# Patient Record
Sex: Female | Born: 1945
Health system: Southern US, Community
[De-identification: ages and names within clinical notes are randomized; demographics above are authoritative.]

## PROBLEM LIST (undated history)

## (undated) DIAGNOSIS — E039 Hypothyroidism, unspecified: Secondary | ICD-10-CM

## (undated) DIAGNOSIS — I493 Ventricular premature depolarization: Secondary | ICD-10-CM

## (undated) DIAGNOSIS — F329 Major depressive disorder, single episode, unspecified: Secondary | ICD-10-CM

## (undated) DIAGNOSIS — J45909 Unspecified asthma, uncomplicated: Secondary | ICD-10-CM

## (undated) DIAGNOSIS — E785 Hyperlipidemia, unspecified: Secondary | ICD-10-CM

## (undated) DIAGNOSIS — F419 Anxiety disorder, unspecified: Secondary | ICD-10-CM

## (undated) DIAGNOSIS — I10 Essential (primary) hypertension: Secondary | ICD-10-CM

## (undated) DIAGNOSIS — K529 Noninfective gastroenteritis and colitis, unspecified: Secondary | ICD-10-CM

## (undated) DIAGNOSIS — F32A Depression, unspecified: Secondary | ICD-10-CM

## (undated) DIAGNOSIS — K589 Irritable bowel syndrome without diarrhea: Secondary | ICD-10-CM

## (undated) DIAGNOSIS — K5792 Diverticulitis of intestine, part unspecified, without perforation or abscess without bleeding: Secondary | ICD-10-CM

## (undated) DIAGNOSIS — C439 Malignant melanoma of skin, unspecified: Secondary | ICD-10-CM

## (undated) HISTORY — DX: Diverticulitis of intestine, part unspecified, without perforation or abscess without bleeding: K57.92

## (undated) HISTORY — PX: OTHER SURGICAL HISTORY: SHX169

## (undated) HISTORY — DX: Noninfective gastroenteritis and colitis, unspecified: K52.9

## (undated) HISTORY — PX: FOOT SURGERY: SHX648

## (undated) HISTORY — DX: Unspecified asthma, uncomplicated: J45.909

## (undated) HISTORY — PX: HERNIA REPAIR: SHX51

## (undated) HISTORY — DX: Ventricular premature depolarization: I49.3

## (undated) HISTORY — DX: Depression, unspecified: F32.A

## (undated) HISTORY — DX: Hypothyroidism, unspecified: E03.9

## (undated) HISTORY — DX: Anxiety disorder, unspecified: F41.9

## (undated) HISTORY — DX: Hyperlipidemia, unspecified: E78.5

## (undated) HISTORY — PX: CHOLECYSTECTOMY: SHX55

## (undated) HISTORY — PX: VAGINAL HYSTERECTOMY: SUR661

## (undated) HISTORY — PX: BLADDER REPAIR: SHX76

## (undated) HISTORY — PX: MELANOMA EXCISION: SHX5266

## (undated) HISTORY — DX: Major depressive disorder, single episode, unspecified: F32.9

---

## 1998-04-23 ENCOUNTER — Ambulatory Visit (HOSPITAL_COMMUNITY): Admission: RE | Admit: 1998-04-23 | Discharge: 1998-04-23 | Payer: Self-pay | Admitting: Gastroenterology

## 1998-05-16 ENCOUNTER — Ambulatory Visit (HOSPITAL_COMMUNITY): Admission: RE | Admit: 1998-05-16 | Discharge: 1998-05-17 | Payer: Self-pay | Admitting: General Surgery

## 1999-09-16 ENCOUNTER — Inpatient Hospital Stay (HOSPITAL_COMMUNITY): Admission: RE | Admit: 1999-09-16 | Discharge: 1999-09-20 | Payer: Self-pay | Admitting: Obstetrics and Gynecology

## 1999-12-10 ENCOUNTER — Encounter: Payer: Self-pay | Admitting: Family Medicine

## 1999-12-10 ENCOUNTER — Encounter: Admission: RE | Admit: 1999-12-10 | Discharge: 1999-12-10 | Payer: Self-pay | Admitting: Family Medicine

## 1999-12-17 ENCOUNTER — Encounter: Admission: RE | Admit: 1999-12-17 | Discharge: 1999-12-17 | Payer: Self-pay | Admitting: Family Medicine

## 1999-12-17 ENCOUNTER — Encounter: Payer: Self-pay | Admitting: Family Medicine

## 2000-12-02 ENCOUNTER — Encounter: Payer: Self-pay | Admitting: Family Medicine

## 2000-12-02 ENCOUNTER — Encounter: Admission: RE | Admit: 2000-12-02 | Discharge: 2000-12-02 | Payer: Self-pay | Admitting: Family Medicine

## 2000-12-03 ENCOUNTER — Encounter: Admission: RE | Admit: 2000-12-03 | Discharge: 2000-12-03 | Payer: Self-pay | Admitting: Family Medicine

## 2000-12-03 ENCOUNTER — Encounter: Payer: Self-pay | Admitting: Family Medicine

## 2000-12-28 ENCOUNTER — Ambulatory Visit (HOSPITAL_COMMUNITY): Admission: RE | Admit: 2000-12-28 | Discharge: 2000-12-28 | Payer: Self-pay | Admitting: Neurosurgery

## 2000-12-28 ENCOUNTER — Encounter: Payer: Self-pay | Admitting: Neurosurgery

## 2001-05-03 ENCOUNTER — Encounter: Payer: Self-pay | Admitting: Family Medicine

## 2001-05-03 ENCOUNTER — Encounter: Admission: RE | Admit: 2001-05-03 | Discharge: 2001-05-03 | Payer: Self-pay | Admitting: Family Medicine

## 2002-02-25 ENCOUNTER — Encounter: Payer: Self-pay | Admitting: Gastroenterology

## 2002-02-25 ENCOUNTER — Encounter: Admission: RE | Admit: 2002-02-25 | Discharge: 2002-02-25 | Payer: Self-pay | Admitting: Gastroenterology

## 2002-03-11 ENCOUNTER — Other Ambulatory Visit: Admission: RE | Admit: 2002-03-11 | Discharge: 2002-03-11 | Payer: Self-pay | Admitting: Radiology

## 2002-03-11 ENCOUNTER — Encounter: Payer: Self-pay | Admitting: Family Medicine

## 2002-03-11 ENCOUNTER — Encounter: Admission: RE | Admit: 2002-03-11 | Discharge: 2002-03-11 | Payer: Self-pay | Admitting: Family Medicine

## 2002-03-11 ENCOUNTER — Encounter (INDEPENDENT_AMBULATORY_CARE_PROVIDER_SITE_OTHER): Payer: Self-pay | Admitting: *Deleted

## 2002-03-14 ENCOUNTER — Encounter: Admission: RE | Admit: 2002-03-14 | Discharge: 2002-03-14 | Payer: Self-pay | Admitting: Gastroenterology

## 2002-03-14 ENCOUNTER — Encounter: Payer: Self-pay | Admitting: Gastroenterology

## 2002-03-24 ENCOUNTER — Observation Stay (HOSPITAL_COMMUNITY): Admission: RE | Admit: 2002-03-24 | Discharge: 2002-03-25 | Payer: Self-pay | Admitting: General Surgery

## 2002-03-24 ENCOUNTER — Encounter: Payer: Self-pay | Admitting: General Surgery

## 2002-03-24 ENCOUNTER — Encounter (INDEPENDENT_AMBULATORY_CARE_PROVIDER_SITE_OTHER): Payer: Self-pay | Admitting: Specialist

## 2002-05-31 ENCOUNTER — Encounter: Admission: RE | Admit: 2002-05-31 | Discharge: 2002-05-31 | Payer: Self-pay | Admitting: Family Medicine

## 2002-05-31 ENCOUNTER — Encounter: Payer: Self-pay | Admitting: Family Medicine

## 2002-07-18 ENCOUNTER — Encounter (INDEPENDENT_AMBULATORY_CARE_PROVIDER_SITE_OTHER): Payer: Self-pay | Admitting: Specialist

## 2002-07-18 ENCOUNTER — Inpatient Hospital Stay (HOSPITAL_COMMUNITY): Admission: RE | Admit: 2002-07-18 | Discharge: 2002-07-21 | Payer: Self-pay

## 2002-09-19 ENCOUNTER — Encounter: Payer: Self-pay | Admitting: Family Medicine

## 2002-09-19 ENCOUNTER — Encounter: Admission: RE | Admit: 2002-09-19 | Discharge: 2002-09-19 | Payer: Self-pay | Admitting: Family Medicine

## 2003-10-20 ENCOUNTER — Encounter: Admission: RE | Admit: 2003-10-20 | Discharge: 2003-10-20 | Payer: Self-pay | Admitting: Family Medicine

## 2004-11-14 ENCOUNTER — Encounter: Admission: RE | Admit: 2004-11-14 | Discharge: 2004-11-14 | Payer: Self-pay | Admitting: Family Medicine

## 2005-06-09 ENCOUNTER — Encounter: Admission: RE | Admit: 2005-06-09 | Discharge: 2005-06-09 | Payer: Self-pay | Admitting: Family Medicine

## 2005-11-27 ENCOUNTER — Encounter: Admission: RE | Admit: 2005-11-27 | Discharge: 2005-11-27 | Payer: Self-pay | Admitting: Family Medicine

## 2006-12-01 ENCOUNTER — Encounter: Admission: RE | Admit: 2006-12-01 | Discharge: 2006-12-01 | Payer: Self-pay | Admitting: Family Medicine

## 2007-03-15 ENCOUNTER — Inpatient Hospital Stay (HOSPITAL_COMMUNITY): Admission: RE | Admit: 2007-03-15 | Discharge: 2007-03-17 | Payer: Self-pay | Admitting: Obstetrics and Gynecology

## 2007-05-28 ENCOUNTER — Encounter: Admission: RE | Admit: 2007-05-28 | Discharge: 2007-05-28 | Payer: Self-pay | Admitting: Family Medicine

## 2007-12-31 ENCOUNTER — Encounter: Admission: RE | Admit: 2007-12-31 | Discharge: 2007-12-31 | Payer: Self-pay | Admitting: Family Medicine

## 2008-02-11 ENCOUNTER — Encounter: Admission: RE | Admit: 2008-02-11 | Discharge: 2008-02-11 | Payer: Self-pay | Admitting: Family Medicine

## 2008-10-05 ENCOUNTER — Emergency Department (HOSPITAL_COMMUNITY): Admission: EM | Admit: 2008-10-05 | Discharge: 2008-10-06 | Payer: Self-pay | Admitting: Emergency Medicine

## 2008-10-06 ENCOUNTER — Ambulatory Visit: Payer: Self-pay | Admitting: Psychiatry

## 2008-10-06 ENCOUNTER — Inpatient Hospital Stay (HOSPITAL_COMMUNITY): Admission: RE | Admit: 2008-10-06 | Discharge: 2008-10-10 | Payer: Self-pay | Admitting: Psychiatry

## 2008-10-11 ENCOUNTER — Other Ambulatory Visit (HOSPITAL_COMMUNITY): Admission: RE | Admit: 2008-10-11 | Discharge: 2008-10-16 | Payer: Self-pay | Admitting: Psychiatry

## 2009-01-01 ENCOUNTER — Encounter: Admission: RE | Admit: 2009-01-01 | Discharge: 2009-01-01 | Payer: Self-pay | Admitting: Family Medicine

## 2009-05-30 ENCOUNTER — Ambulatory Visit (HOSPITAL_COMMUNITY): Admission: RE | Admit: 2009-05-30 | Discharge: 2009-05-30 | Payer: Self-pay | Admitting: General Surgery

## 2009-08-09 ENCOUNTER — Ambulatory Visit (HOSPITAL_BASED_OUTPATIENT_CLINIC_OR_DEPARTMENT_OTHER): Admission: RE | Admit: 2009-08-09 | Discharge: 2009-08-09 | Payer: Self-pay | Admitting: Otolaryngology

## 2009-08-27 ENCOUNTER — Inpatient Hospital Stay (HOSPITAL_COMMUNITY): Admission: RE | Admit: 2009-08-27 | Discharge: 2009-08-29 | Payer: Self-pay | Admitting: Otolaryngology

## 2009-08-27 ENCOUNTER — Encounter (INDEPENDENT_AMBULATORY_CARE_PROVIDER_SITE_OTHER): Payer: Self-pay | Admitting: Otolaryngology

## 2009-09-02 ENCOUNTER — Ambulatory Visit: Payer: Self-pay | Admitting: Internal Medicine

## 2009-10-02 ENCOUNTER — Ambulatory Visit (HOSPITAL_COMMUNITY): Admission: RE | Admit: 2009-10-02 | Discharge: 2009-10-03 | Payer: Self-pay | Admitting: Urology

## 2010-01-14 ENCOUNTER — Encounter: Admission: RE | Admit: 2010-01-14 | Discharge: 2010-01-14 | Payer: Self-pay | Admitting: Family Medicine

## 2010-12-23 ENCOUNTER — Other Ambulatory Visit: Payer: Self-pay | Admitting: Family Medicine

## 2010-12-23 DIAGNOSIS — Z1231 Encounter for screening mammogram for malignant neoplasm of breast: Secondary | ICD-10-CM

## 2011-01-13 LAB — ABO/RH: ABO/RH(D): O POS

## 2011-01-13 LAB — HEMOGLOBIN AND HEMATOCRIT, BLOOD
HCT: 36 % (ref 36.0–46.0)
HCT: 36.1 % (ref 36.0–46.0)
Hemoglobin: 11.8 g/dL — ABNORMAL LOW (ref 12.0–15.0)
Hemoglobin: 12.1 g/dL (ref 12.0–15.0)

## 2011-01-13 LAB — CBC
HCT: 40.1 % (ref 36.0–46.0)
Hemoglobin: 13.3 g/dL (ref 12.0–15.0)
MCHC: 33 g/dL (ref 30.0–36.0)
MCV: 94.1 fL (ref 78.0–100.0)
Platelets: 206 10*3/uL (ref 150–400)
RBC: 4.26 MIL/uL (ref 3.87–5.11)
RDW: 13.7 % (ref 11.5–15.5)
WBC: 7.5 10*3/uL (ref 4.0–10.5)

## 2011-01-13 LAB — TYPE AND SCREEN
ABO/RH(D): O POS
Antibody Screen: NEGATIVE

## 2011-01-13 LAB — PROTIME-INR
INR: 0.97 (ref 0.00–1.49)
Prothrombin Time: 12.8 seconds (ref 11.6–15.2)

## 2011-01-13 LAB — APTT: aPTT: 30 seconds (ref 24–37)

## 2011-01-15 LAB — URINALYSIS, ROUTINE W REFLEX MICROSCOPIC
Bilirubin Urine: NEGATIVE
Glucose, UA: NEGATIVE mg/dL
Hgb urine dipstick: NEGATIVE
Ketones, ur: NEGATIVE mg/dL
Nitrite: NEGATIVE
Protein, ur: NEGATIVE mg/dL
Specific Gravity, Urine: 1.02 (ref 1.005–1.030)
Urobilinogen, UA: 0.2 mg/dL (ref 0.0–1.0)
pH: 5.5 (ref 5.0–8.0)

## 2011-01-15 LAB — BASIC METABOLIC PANEL
BUN: 15 mg/dL (ref 6–23)
CO2: 29 mEq/L (ref 19–32)
Calcium: 9 mg/dL (ref 8.4–10.5)
Chloride: 105 mEq/L (ref 96–112)
Creatinine, Ser: 0.92 mg/dL (ref 0.4–1.2)
GFR calc Af Amer: >60 mL/min (ref >60)
GFR calc non Af Amer: >60 mL/min (ref >60)
Glucose, Bld: 106 mg/dL — ABNORMAL HIGH (ref 70–99)
Potassium: 3.6 mEq/L (ref 3.5–5.1)
Sodium: 139 mEq/L (ref 135–145)

## 2011-01-15 LAB — CBC
HCT: 39 % (ref 36.0–46.0)
Hemoglobin: 13.6 g/dL (ref 12.0–15.0)
MCHC: 34.7 g/dL (ref 30.0–36.0)
MCV: 92.4 fL (ref 78.0–100.0)
Platelets: 206 10*3/uL (ref 150–400)
RBC: 4.23 MIL/uL (ref 3.87–5.11)
RDW: 13.1 % (ref 11.5–15.5)
WBC: 7.7 10*3/uL (ref 4.0–10.5)

## 2011-01-18 LAB — CBC
HCT: 39.3 % (ref 36.0–46.0)
Hemoglobin: 13.4 g/dL (ref 12.0–15.0)
MCHC: 34.2 g/dL (ref 30.0–36.0)
MCV: 92.2 fL (ref 78.0–100.0)
Platelets: 202 10*3/uL (ref 150–400)
RBC: 4.26 MIL/uL (ref 3.87–5.11)
RDW: 13.3 % (ref 11.5–15.5)
WBC: 8.2 10*3/uL (ref 4.0–10.5)

## 2011-01-18 LAB — BASIC METABOLIC PANEL
BUN: 11 mg/dL (ref 6–23)
CO2: 32 mEq/L (ref 19–32)
Calcium: 9.4 mg/dL (ref 8.4–10.5)
Chloride: 100 mEq/L (ref 96–112)
Creatinine, Ser: 0.91 mg/dL (ref 0.4–1.2)
GFR calc Af Amer: >60 mL/min (ref >60)
GFR calc non Af Amer: >60 mL/min (ref >60)
Glucose, Bld: 87 mg/dL (ref 70–99)
Potassium: 3.5 mEq/L (ref 3.5–5.1)
Sodium: 139 mEq/L (ref 135–145)

## 2011-01-20 ENCOUNTER — Ambulatory Visit
Admission: RE | Admit: 2011-01-20 | Discharge: 2011-01-20 | Disposition: A | Discharge status: Home / Self Care | Payer: Medicare Other | Source: Ambulatory Visit | Attending: Family Medicine | Admitting: Family Medicine

## 2011-01-20 DIAGNOSIS — Z1231 Encounter for screening mammogram for malignant neoplasm of breast: Secondary | ICD-10-CM

## 2011-02-25 NOTE — H&P (Signed)
[name], [name]               ACCOUNT NO.:  [id number]   MEDICAL RECORD NO.:  [id number]          PATIENT TYPE:  IPS   LOCATION:  0503                          FACILITY:  [facility]   PHYSICIAN:  Geoffery Lyons, M.D.      DATE OF BIRTH:  June 18, 1946   DATE OF ADMISSION:  10/06/2008  DATE OF DISCHARGE:                       PSYCHIATRIC ADMISSION ASSESSMENT   The patient is a 65 year old female voluntarily admitted on October 06, 2008.   HISTORY OF PRESENT ILLNESS:  The patient presents with a self-inflicted  injury.  She cut her left wrist at home.  She has been feeling  depressed, tired, stating that she just does not want to live anymore.  She is having conflict with her daughter.  She was brought to the  emergency room per her husband who wanted her to get some help.   PAST PSYCHIATRIC HISTORY:  First admission to [facility].  She was hospitalized at [facility] about 18 years ago and had  recurrent outpatient mental health treatment.  Patient has been on  Cymbalta in the past.   SOCIAL HISTORY:  A 65 year old married female.  She is retired, was a  Sales executive.   FAMILY HISTORY:  None.   ALCOHOL AND DRUG HISTORY:  Patient denies any alcohol or drug history.  She is a nonsmoker.   PRIMARY CARE Mikias Lanz:  Dr. Clovis Riley   MEDICAL PROBLEMS:  Hypertension.   MEDICATIONS:  Toprol, uncertain of the dose at this time.   DRUG ALLERGIES:  PENICILLIN, CODEINE AND SULFA.   PHYSICAL EXAMINATION:  GENERAL:  This is a middle-aged female assessed  at [facility].  She did receive a tetanus  injection.  She did have her lacerations sutured, and wound is currently  dressed with a Kerlix.  VITAL SIGNS:  Temperature of 97.3, heart rate 60, respirations 18, blood  pressure 153/103.  She is 5 feet 4 inches tall, 186 pounds.   LABORATORY DATA:  Shows a glucose of 105, alcohol level of less than 5,  urine drug screen positive for benzodiazepines.   CBC was within normal  limits.   MENTAL STATUS EXAMINATION:  This is an alert female, cooperative, good  eye contact, currently wearing hospital scrubs.  She is resting in bed.  Her speech is clear, normal pace and tone.  Patient's mood is depressed.  Patient is pleasant but also appears depressed and sad.  Thought  processes are coherent.  No evidence of any psychotic symptoms.  No  delusional statements are made, promise of safety on the unit.  Cognitive function is intact.  Her memory appears to be good.  Her  judgment and insight are fair.   Axis I  Major depressed disorder.  Axis II  Deferred.  Axis III  Hypertension and self-inflicted injury with lacerations to the  left wrist.  Axis IV  Problems with primary support group with her daughter, possible  other psychosocial problems.  Axis V  Current is 30 to 35.   PLAN:  Stabilize her mood and thinking.  We will resume her Toprol and  monitor her wounds.  We will have a family session with her husband for  support concerns.  Patient may benefit from outpatient therapy and  psychiatry med management.  We will consider an antidepressant and  continue to assess patient's other comorbidities.  Her tentative length  of stay at this time is 3-5 days.      Landry Corporal, N.P.      Geoffery Lyons, M.D.  Electronically Signed    JO/MEDQ  D:  10/09/2008  T:  10/09/2008  Job:  [id number]

## 2011-02-25 NOTE — Op Note (Signed)
[name], [name]               ACCOUNT NO.:  [id number]   MEDICAL RECORD NO.:  [id number]          PATIENT TYPE:  AMB   LOCATION:  SDS                          FACILITY:  [facility]   PHYSICIAN:  Gaynelle Adu, MD        DATE OF BIRTH:  06/07/1946   DATE OF PROCEDURE:  05/30/2009  DATE OF DISCHARGE:                               OPERATIVE REPORT   SURGEON:  Mary Sella. Andrey Campanile, MD.   ASSISTANT SURGEON:  Anselm Pancoast. Zachery Dakins, M.D.   PREOPERATIVE DIAGNOSIS:  Left chest wall melanoma (T1A).   POSTOPERATIVE DIAGNOSIS:  Left chest wall melanoma (T1A).   PROCEDURE:  Wide local excision of left chest wall melanoma.   FINDINGS:  1 cm margins were obtained.  Total skin incision was 6 cm in  length.   ANESTHESIA:  General plus 30 mL of 0.25% Marcaine with epinephrine.   SPECIMENS:  Left chest wall melanoma.  Marking stitches include short  superior and long lateral.   ESTIMATED BLOOD LOSS:  Minimal.   COMPLICATIONS:  None immediately apparent.   INDICATIONS FOR PROCEDURE:  The patient is a 65 year old Caucasian  female who went to her dermatologist because she noticed a skin lesion  that would intermittent blister on her left chest wall.  She underwent a  shave biopsy by her dermatologist.  The pathology revealed a malignant  melanoma with Breslow thickness of 0.2 mm with no adverse features.  The  peripheral margin was involved.  She was referred to me for  consideration for surgical management.  We discussed surgical management  including wide local excision.  The risks and benefits of the proposed  procedure were explained in detail including bleeding, infection,  seroma, hematoma, need for additional procedures, and scarring.  She  elected to proceed to surgery after informed consent was obtained.   DESCRIPTION OF PROCEDURE:  The patient was brought to the operating room  and placed supine on the operating room table.  General anesthesia was  established.  A surgical time-out was  performed.  She received 1 g of  vancomycin prior to skin incision.  Her chest wall was prepped and  draped in the usual standard surgical fashion.  Taking ruler and marking  pen, I made a 1 cm circumferential mark around the lesion on her chest  wall then drew an elliptical incision around this incorporating the 1 cm  margin.  Next using a #15 blade, the skin was incised along the previous  outline.  Essentially, it was a tear-drop incision.  Once the dermis was  divided with the knife, skin hooks were placed and electrocautery was  used to divide the subcutaneous tissue and fat straight down.  We then  excised the skin and subcutaneous tissue in its entirety.  The specimen  was removed.  A 3-0 silk suture was used to mark the specimen with a  short superior stitch and a long lateral stitch and it was sent to  Pathology as routine.  Then using electrocautery, hemostasis was  achieved.  The wound was copiously irrigated.  Then using  electrocautery, I released  some of the subcutaneous tissue along the  superior and inferior flap.  Then, the wound was reapproximated with  interrupted inverted 3-0 Vicryl in deep dermal fashion.  Then, 4-0 nylon  sutures were used to reapproximate the skin by using vertical mattress  sutures as well as interrupted sutures.  The wound edges were well  approximated.  A 4x4 was applied followed by an OpSite bandage.  Prior  to final placement of dressing additional local was infiltrated in the  area for total injection of 30 mL of 0.25% Marcaine with epinephrine.  The patient was extubated and taken to the recovery room in stable  condition.  All counts were correct x2.  The patient tolerated the  procedure well.  There were no immediate complications.      Gaynelle Adu, MD  Electronically Signed     EW/MEDQ  D:  05/30/2009  T:  05/31/2009  Job:  [id number]

## 2011-02-28 NOTE — Discharge Summary (Signed)
Mary, Dudley               ACCOUNT NO.:  [id number]   MEDICAL RECORD NO.:  [id number]          PATIENT TYPE:  IPS   LOCATION:  0503                          [facility]ACILITY:  [facility]   PHYSICIAN:  Anselm Jungling, MD  DATE OF BIRTH:  Nov 27, 1945   DATE OF ADMISSION:  10/06/2008  DATE OF DISCHARGE:  10/10/2008                               DISCHARGE SUMMARY   IDENTIFYING DATA AND REASON FOR ADMISSION:  This was an inpatient  psychiatric admission for [name], a 65 year old married white female who  was admitted because of increasing depression and suicidal ideation.  Please refer to the admission note for further details pertaining to the  symptoms, circumstances and history that led to her hospitalization.  She was given an initial Axis I diagnosis of rule out major depressive  episode.   MEDICAL AND LABORATORY:  The patient was medically and physically  assessed by the psychiatric nurse practitioner.  She was continued on a  regimen of Zetia and Zocor.  There were no acute medical issues.   HOSPITAL COURSE:  The patient was admitted to the adult inpatient  psychiatric service.  She presented as a well-nourished, normally-  developed female with a wrist that was dressed to cover her laceration.  She was alert, oriented, and pleasant but quite sad.  There were no  signs or symptoms of psychosis.  She denied any active suicidal ideation  or attempt and verbalized a strong desire for help.  She was involved in  the therapeutic milieu, and started on a trial of Prozac 10 mg daily.  Xanax 0.25 mg b.i.d. was utilized to address anxiety, and Ambien 10 mg  at bed for sleep, in combination with trazodone 50 mg at bed.   She participated well in therapeutic groups and activities.  She did  have supportive family visits over the holiday weekend.  Later in her  stay, we arranged for a family session involving her husband, which she  accepted eagerly.  Her husband was highly supportive.   By the  fifth hospital day, the patient was much brighter, and reported  that she felt much better with respect to her mood.  She reported that  she was sleeping very well.  She was feeling optimistic about her  ability to recover from her depression.  She was interested in our  intensive outpatient program for step-down care.  Her family session  with her husband was very positive.  The patient was appropriate for  discharge on the fifth hospital day.  She agreed to the following  aftercare plan.   AFTERCARE:  The patient was to return to [facility] for the intensive  outpatient program, the morning following her discharge.   DISCHARGE MEDICATIONS:  1. Xanax 0.25 mg b.i.d.  2. Prozac 10 mg daily.  3. Ambien 10 mg every night.  4. Zetia 10 mg daily.  5. Zocor 20 mg daily.  6. Trazodone 50 mg every night.   She was instructed to see her family doctor, Urgent Care, or return to  the emergency department to have her sutures removed on the tenth  day  following their initial placement.   DISCHARGE DIAGNOSES:  AXIS I:  Major depressive episode.  AXIS II:  Deferred.  AXIS III:  History of hypertension.  AXIS IV:  Stressors severe.  AXIS V:  GAF on discharge on 55.      Anselm Jungling, MD  Electronically Signed     SPB/MEDQ  D:  10/18/2008  T:  10/18/2008  Job:  [id number]

## 2011-02-28 NOTE — H&P (Signed)
[name], [name] NO.:  [id number]   MEDICAL RECORD NO.:  [id number]          PATIENT TYPE:  AMB   LOCATION:                                FACILITY:  [facility]   PHYSICIAN:  Duke Salvia. Marcelle Overlie, M.D.DATE OF BIRTH:  1946/07/19   DATE OF ADMISSION:  03/15/2007  DATE OF DISCHARGE:                              HISTORY & PHYSICAL   CHIEF COMPLAINT:  Cystocele or rectocele.   HISTORY OF PRESENT ILLNESS:  A 65 year old who had a prior TAH BSO Burch  procedure by me in 2001.  She has remained continent but has noticed  some increasing vaginal pressure and on exam, had moderate cystocele,  small rectocele with some small degree of vaginal cuff prolapse.  Bimanual was otherwise unremarkable.  I have not seen in some time.  She  has been getting routine care with her family doctor, Dr. Clovis Riley.   We plan A&P repair with SSLS.  This procedure including risk of  bleeding, infection, other complications that may require open or  additional surgery along with her expected recovery time all reviewed  with her.  She has been placed on preoperative vaginal estrogen cream.   CURRENT MEDICATIONS:  Toprol XL, Imipram HCL and Vytorin.   ALLERGIES:  CODEINE, SULFA, PENICILLIN.   PAST SURGERIES:  TAH-BSO Burch procedure in 2001.   OBSTETRICAL HISTORY:  Two vaginal deliveries at term.   FAMILY HISTORY:  Significant for father with diabetes.  Both father and  mother with hypertension and her father had heart disease also.   OTHER SURGERY:  She has had foot surgery, tummy-tuck and esophageal wrap  for a hiatal hernia.   PHYSICAL EXAM:  VITAL SIGNS:  Temperature 98.2, blood pressure 120/82.  HEENT:  Unremarkable.  NECK:  Supple without masses.  LUNGS:  Clear.  CARDIOVASCULAR:  Regular rate and rhythm without murmurs, rubs, or  gallops noted.  BREASTS:  Without masses.  ABDOMEN:  Soft, flat and nontender.  PELVIC:  Vulva was unremarkable.  On straining there was a moderate  cystocele, small rectocele with some mild cuff prolapse.  Bimanual was  otherwise negative.  EXTREMITIES:  Unremarkable.  NEUROLOGIC:  Unremarkable.   IMPRESSION:  Symptomatic cystocele and rectocele.   PLAN:  A&P repair with possible SSLS procedure and risks reviewed as  above.      Richard M. Marcelle Overlie, M.D.  Electronically Signed     RMH/MEDQ  D:  03/05/2007  T:  03/05/2007  Job:  [id number]

## 2011-03-19 ENCOUNTER — Other Ambulatory Visit: Payer: Self-pay

## 2011-07-18 LAB — BASIC METABOLIC PANEL
BUN: 8 mg/dL (ref 6–23)
CO2: 27 mEq/L (ref 19–32)
Calcium: 9 mg/dL (ref 8.4–10.5)
Chloride: 104 mEq/L (ref 96–112)
Creatinine, Ser: 0.9 mg/dL (ref 0.4–1.2)
GFR calc Af Amer: >60 mL/min (ref >60)
GFR calc non Af Amer: >60 mL/min (ref >60)
Glucose, Bld: 105 mg/dL — ABNORMAL HIGH (ref 70–99)
Potassium: 3.5 mEq/L (ref 3.5–5.1)
Sodium: 139 mEq/L (ref 135–145)

## 2011-07-18 LAB — CBC
HCT: 41.8 % (ref 36.0–46.0)
Hemoglobin: 14 g/dL (ref 12.0–15.0)
MCHC: 33.4 g/dL (ref 30.0–36.0)
MCV: 92.8 fL (ref 78.0–100.0)
Platelets: 219 10*3/uL (ref 150–400)
RBC: 4.5 MIL/uL (ref 3.87–5.11)
RDW: 12.4 % (ref 11.5–15.5)
WBC: 8.4 10*3/uL (ref 4.0–10.5)

## 2011-07-18 LAB — DIFFERENTIAL
Basophils Absolute: 0 10*3/uL (ref 0.0–0.1)
Basophils Relative: 0 % (ref 0–1)
Eosinophils Absolute: 0.3 10*3/uL (ref 0.0–0.7)
Eosinophils Relative: 3 % (ref 0–5)
Lymphocytes Relative: 36 % (ref 12–46)
Lymphs Abs: 3 10*3/uL (ref 0.7–4.0)
Monocytes Absolute: 0.7 10*3/uL (ref 0.1–1.0)
Monocytes Relative: 9 % (ref 3–12)
Neutro Abs: 4.4 10*3/uL (ref 1.7–7.7)
Neutrophils Relative %: 52 % (ref 43–77)

## 2011-07-18 LAB — RAPID URINE DRUG SCREEN, HOSP PERFORMED
Amphetamines: NOT DETECTED
Barbiturates: NOT DETECTED
Benzodiazepines: POSITIVE — AB
Cocaine: NOT DETECTED
Opiates: NOT DETECTED
Tetrahydrocannabinol: NOT DETECTED

## 2011-07-18 LAB — ETHANOL: Alcohol, Ethyl (B): <5 mg/dL (ref 0–10)

## 2011-07-18 LAB — TRICYCLICS SCREEN, URINE: TCA Scrn: NOT DETECTED

## 2011-07-31 LAB — CBC
HCT: 33 — ABNORMAL LOW
Hemoglobin: 11.4 — ABNORMAL LOW
MCHC: 34.7
MCV: 89.6
Platelets: 193
RBC: 3.68 — ABNORMAL LOW
RDW: 13.1
WBC: 15.2 — ABNORMAL HIGH

## 2011-09-17 ENCOUNTER — Other Ambulatory Visit: Payer: Self-pay

## 2011-10-01 ENCOUNTER — Other Ambulatory Visit: Payer: Self-pay | Admitting: Dermatology

## 2011-12-09 ENCOUNTER — Other Ambulatory Visit: Payer: Self-pay | Admitting: Gastroenterology

## 2011-12-09 DIAGNOSIS — R1013 Epigastric pain: Secondary | ICD-10-CM

## 2011-12-10 ENCOUNTER — Ambulatory Visit
Admission: RE | Admit: 2011-12-10 | Discharge: 2011-12-10 | Disposition: A | Discharge status: Home / Self Care | Payer: Medicare Other | Source: Ambulatory Visit | Attending: Gastroenterology | Admitting: Gastroenterology

## 2011-12-10 DIAGNOSIS — R1013 Epigastric pain: Secondary | ICD-10-CM

## 2011-12-10 MED ORDER — GADOBENATE DIMEGLUMINE 529 MG/ML IV SOLN
16.0000 mL | Freq: Once | INTRAVENOUS | Status: AC | PRN
  Administered 2011-12-10: 16 mL via INTRAVENOUS

## 2011-12-11 ENCOUNTER — Other Ambulatory Visit: Payer: Self-pay | Admitting: Gastroenterology

## 2011-12-15 ENCOUNTER — Other Ambulatory Visit: Payer: Self-pay | Admitting: Family Medicine

## 2011-12-15 DIAGNOSIS — Z1231 Encounter for screening mammogram for malignant neoplasm of breast: Secondary | ICD-10-CM

## 2012-01-21 ENCOUNTER — Ambulatory Visit
Admission: RE | Admit: 2012-01-21 | Discharge: 2012-01-21 | Disposition: A | Discharge status: Home / Self Care | Payer: Medicare Other | Source: Ambulatory Visit | Attending: Family Medicine | Admitting: Family Medicine

## 2012-01-21 DIAGNOSIS — Z1231 Encounter for screening mammogram for malignant neoplasm of breast: Secondary | ICD-10-CM

## 2012-07-14 ENCOUNTER — Other Ambulatory Visit: Payer: Self-pay | Admitting: Dermatology

## 2012-12-29 ENCOUNTER — Other Ambulatory Visit: Payer: Self-pay

## 2012-12-29 DIAGNOSIS — Z1231 Encounter for screening mammogram for malignant neoplasm of breast: Secondary | ICD-10-CM

## 2013-01-21 ENCOUNTER — Ambulatory Visit: Payer: Medicare Other

## 2013-01-21 ENCOUNTER — Ambulatory Visit
Admission: RE | Admit: 2013-01-21 | Discharge: 2013-01-21 | Disposition: A | Discharge status: Home / Self Care | Payer: Medicare Other | Source: Ambulatory Visit

## 2013-01-21 DIAGNOSIS — Z1231 Encounter for screening mammogram for malignant neoplasm of breast: Secondary | ICD-10-CM

## 2014-01-02 ENCOUNTER — Other Ambulatory Visit: Payer: Self-pay | Admitting: Gastroenterology

## 2014-01-26 ENCOUNTER — Other Ambulatory Visit: Payer: Self-pay

## 2014-01-26 DIAGNOSIS — Z1231 Encounter for screening mammogram for malignant neoplasm of breast: Secondary | ICD-10-CM

## 2014-01-31 ENCOUNTER — Encounter (INDEPENDENT_AMBULATORY_CARE_PROVIDER_SITE_OTHER): Payer: Self-pay

## 2014-01-31 ENCOUNTER — Ambulatory Visit
Admission: RE | Admit: 2014-01-31 | Discharge: 2014-01-31 | Disposition: A | Discharge status: Home / Self Care | Payer: Self-pay | Source: Ambulatory Visit

## 2014-01-31 DIAGNOSIS — Z1231 Encounter for screening mammogram for malignant neoplasm of breast: Secondary | ICD-10-CM

## 2014-05-28 ENCOUNTER — Emergency Department (HOSPITAL_COMMUNITY): Payer: Medicare Other

## 2014-05-28 ENCOUNTER — Observation Stay (HOSPITAL_COMMUNITY)
Admission: EM | Admit: 2014-05-28 | Discharge: 2014-05-30 | Disposition: A | Discharge status: Home / Self Care | Payer: Medicare Other | Attending: Internal Medicine | Admitting: Internal Medicine

## 2014-05-28 ENCOUNTER — Encounter (HOSPITAL_COMMUNITY): Payer: Self-pay | Admitting: Emergency Medicine

## 2014-05-28 DIAGNOSIS — R296 Repeated falls: Secondary | ICD-10-CM | POA: Insufficient documentation

## 2014-05-28 DIAGNOSIS — Z7982 Long term (current) use of aspirin: Secondary | ICD-10-CM | POA: Insufficient documentation

## 2014-05-28 DIAGNOSIS — S0003XA Contusion of scalp, initial encounter: Secondary | ICD-10-CM | POA: Diagnosis present

## 2014-05-28 DIAGNOSIS — Y939 Activity, unspecified: Secondary | ICD-10-CM | POA: Diagnosis not present

## 2014-05-28 DIAGNOSIS — S0993XA Unspecified injury of face, initial encounter: Secondary | ICD-10-CM | POA: Insufficient documentation

## 2014-05-28 DIAGNOSIS — Z79899 Other long term (current) drug therapy: Secondary | ICD-10-CM | POA: Insufficient documentation

## 2014-05-28 DIAGNOSIS — I10 Essential (primary) hypertension: Principal | ICD-10-CM | POA: Diagnosis present

## 2014-05-28 DIAGNOSIS — Z880 Allergy status to penicillin: Secondary | ICD-10-CM | POA: Diagnosis not present

## 2014-05-28 DIAGNOSIS — S0003XS Contusion of scalp, sequela: Secondary | ICD-10-CM

## 2014-05-28 DIAGNOSIS — K589 Irritable bowel syndrome without diarrhea: Secondary | ICD-10-CM | POA: Diagnosis not present

## 2014-05-28 DIAGNOSIS — R29810 Facial weakness: Secondary | ICD-10-CM | POA: Insufficient documentation

## 2014-05-28 DIAGNOSIS — Z85820 Personal history of malignant melanoma of skin: Secondary | ICD-10-CM | POA: Insufficient documentation

## 2014-05-28 DIAGNOSIS — R404 Transient alteration of awareness: Secondary | ICD-10-CM | POA: Insufficient documentation

## 2014-05-28 DIAGNOSIS — S0083XA Contusion of other part of head, initial encounter: Secondary | ICD-10-CM | POA: Insufficient documentation

## 2014-05-28 DIAGNOSIS — S0990XA Unspecified injury of head, initial encounter: Secondary | ICD-10-CM | POA: Insufficient documentation

## 2014-05-28 DIAGNOSIS — R55 Syncope and collapse: Secondary | ICD-10-CM | POA: Diagnosis not present

## 2014-05-28 DIAGNOSIS — Y92009 Unspecified place in unspecified non-institutional (private) residence as the place of occurrence of the external cause: Secondary | ICD-10-CM | POA: Insufficient documentation

## 2014-05-28 DIAGNOSIS — S199XXA Unspecified injury of neck, initial encounter: Secondary | ICD-10-CM

## 2014-05-28 DIAGNOSIS — R531 Weakness: Secondary | ICD-10-CM | POA: Diagnosis present

## 2014-05-28 DIAGNOSIS — S1093XA Contusion of unspecified part of neck, initial encounter: Secondary | ICD-10-CM

## 2014-05-28 DIAGNOSIS — S0003XD Contusion of scalp, subsequent encounter: Secondary | ICD-10-CM

## 2014-05-28 HISTORY — DX: Essential (primary) hypertension: I10

## 2014-05-28 HISTORY — DX: Irritable bowel syndrome, unspecified: K58.9

## 2014-05-28 HISTORY — DX: Malignant melanoma of skin, unspecified: C43.9

## 2014-05-28 LAB — URINALYSIS, ROUTINE W REFLEX MICROSCOPIC
Bilirubin Urine: NEGATIVE
Glucose, UA: NEGATIVE mg/dL
Hgb urine dipstick: NEGATIVE
Ketones, ur: NEGATIVE mg/dL
Leukocytes, UA: NEGATIVE
Nitrite: NEGATIVE
Protein, ur: NEGATIVE mg/dL
Specific Gravity, Urine: 1.019 (ref 1.005–1.030)
Urobilinogen, UA: 0.2 mg/dL (ref 0.0–1.0)
pH: 6.5 (ref 5.0–8.0)

## 2014-05-28 LAB — CBC
HCT: 40.8 % (ref 36.0–46.0)
Hemoglobin: 14.1 g/dL (ref 12.0–15.0)
MCH: 31.7 pg (ref 26.0–34.0)
MCHC: 34.6 g/dL (ref 30.0–36.0)
MCV: 91.7 fL (ref 78.0–100.0)
Platelets: 203 10*3/uL (ref 150–400)
RBC: 4.45 MIL/uL (ref 3.87–5.11)
RDW: 12.9 % (ref 11.5–15.5)
WBC: 8.7 10*3/uL (ref 4.0–10.5)

## 2014-05-28 LAB — COMPREHENSIVE METABOLIC PANEL
ALT: 35 U/L (ref 0–35)
AST: 32 U/L (ref 0–37)
Albumin: 3.5 g/dL (ref 3.5–5.2)
Alkaline Phosphatase: 74 U/L (ref 39–117)
Anion gap: 9 (ref 5–15)
BUN: 14 mg/dL (ref 6–23)
CO2: 29 mEq/L (ref 19–32)
Calcium: 8.9 mg/dL (ref 8.4–10.5)
Chloride: 103 mEq/L (ref 96–112)
Creatinine, Ser: 0.79 mg/dL (ref 0.50–1.10)
GFR calc Af Amer: >90 mL/min (ref >90)
GFR calc non Af Amer: 84 mL/min — ABNORMAL LOW (ref >90)
Glucose, Bld: 132 mg/dL — ABNORMAL HIGH (ref 70–99)
Potassium: 3.5 mEq/L — ABNORMAL LOW (ref 3.7–5.3)
Sodium: 141 mEq/L (ref 137–147)
Total Bilirubin: 0.4 mg/dL (ref 0.3–1.2)
Total Protein: 6.3 g/dL (ref 6.0–8.3)

## 2014-05-28 LAB — DIFFERENTIAL
Basophils Absolute: 0.1 10*3/uL (ref 0.0–0.1)
Basophils Relative: 1 % (ref 0–1)
Eosinophils Absolute: 0.3 10*3/uL (ref 0.0–0.7)
Eosinophils Relative: 4 % (ref 0–5)
Lymphocytes Relative: 23 % (ref 12–46)
Lymphs Abs: 2 10*3/uL (ref 0.7–4.0)
Monocytes Absolute: 0.8 10*3/uL (ref 0.1–1.0)
Monocytes Relative: 9 % (ref 3–12)
Neutro Abs: 5.6 10*3/uL (ref 1.7–7.7)
Neutrophils Relative %: 63 % (ref 43–77)

## 2014-05-28 LAB — APTT: aPTT: 30 seconds (ref 24–37)

## 2014-05-28 LAB — I-STAT CHEM 8, ED
BUN: 14 mg/dL (ref 6–23)
Calcium, Ion: 1.14 mmol/L (ref 1.13–1.30)
Chloride: 103 mEq/L (ref 96–112)
Creatinine, Ser: 0.8 mg/dL (ref 0.50–1.10)
Glucose, Bld: 136 mg/dL — ABNORMAL HIGH (ref 70–99)
HCT: 42 % (ref 36.0–46.0)
Hemoglobin: 14.3 g/dL (ref 12.0–15.0)
Potassium: 3.3 mEq/L — ABNORMAL LOW (ref 3.7–5.3)
Sodium: 141 mEq/L (ref 137–147)
TCO2: 27 mmol/L (ref 0–100)

## 2014-05-28 LAB — PROTIME-INR
INR: 0.99 (ref 0.00–1.49)
Prothrombin Time: 13.1 seconds (ref 11.6–15.2)

## 2014-05-28 LAB — I-STAT TROPONIN, ED: Troponin i, poc: 0 ng/mL (ref 0.00–0.08)

## 2014-05-28 LAB — RAPID URINE DRUG SCREEN, HOSP PERFORMED
Amphetamines: NOT DETECTED
Barbiturates: NOT DETECTED
Benzodiazepines: POSITIVE — AB
Cocaine: NOT DETECTED
Opiates: NOT DETECTED
Tetrahydrocannabinol: NOT DETECTED

## 2014-05-28 LAB — ETHANOL: Alcohol, Ethyl (B): <11 mg/dL (ref 0–11)

## 2014-05-28 MED ORDER — ASPIRIN EC 81 MG PO TBEC: 81.0000 mg | DELAYED_RELEASE_TABLET | Freq: Every day | ORAL | Status: DC

## 2014-05-28 MED ORDER — ENOXAPARIN SODIUM 40 MG/0.4ML ~~LOC~~ SOLN
40.0000 mg | SUBCUTANEOUS | Status: DC
  Administered 2014-05-28 – 2014-05-29 (×2): 40 mg via SUBCUTANEOUS
  Filled 2014-05-28 (×3): qty 0.4

## 2014-05-28 MED ORDER — ASPIRIN 325 MG PO TABS
325.0000 mg | ORAL_TABLET | Freq: Every day | ORAL | Status: DC
  Administered 2014-05-29 – 2014-05-30 (×2): 325 mg via ORAL
  Filled 2014-05-28 (×2): qty 1

## 2014-05-28 MED ORDER — ASPIRIN 300 MG RE SUPP
300.0000 mg | Freq: Every day | RECTAL | Status: DC
  Filled 2014-05-28 (×2): qty 1

## 2014-05-28 MED ORDER — SENNOSIDES-DOCUSATE SODIUM 8.6-50 MG PO TABS
1.0000 | ORAL_TABLET | Freq: Every evening | ORAL | Status: DC | PRN
  Filled 2014-05-28: qty 1

## 2014-05-28 MED ORDER — STROKE: EARLY STAGES OF RECOVERY BOOK
Freq: Once | Status: DC
  Filled 2014-05-28: qty 1

## 2014-05-28 MED ORDER — METOPROLOL TARTRATE 25 MG PO TABS
25.0000 mg | ORAL_TABLET | Freq: Two times a day (BID) | ORAL | Status: DC
  Administered 2014-05-28 – 2014-05-30 (×4): 25 mg via ORAL
  Filled 2014-05-28 (×5): qty 1

## 2014-05-28 MED ORDER — ALPRAZOLAM 0.5 MG PO TABS
2.0000 mg | ORAL_TABLET | Freq: Every day | ORAL | Status: DC
  Administered 2014-05-28 – 2014-05-29 (×2): 2 mg via ORAL
  Filled 2014-05-28 (×3): qty 4

## 2014-05-28 MED ORDER — SERTRALINE HCL 50 MG PO TABS
50.0000 mg | ORAL_TABLET | Freq: Every day | ORAL | Status: DC
  Administered 2014-05-29 – 2014-05-30 (×2): 50 mg via ORAL
  Filled 2014-05-28 (×2): qty 1

## 2014-05-28 MED ORDER — HYDROCHLOROTHIAZIDE 25 MG PO TABS
25.0000 mg | ORAL_TABLET | Freq: Every day | ORAL | Status: DC
  Administered 2014-05-29 – 2014-05-30 (×2): 25 mg via ORAL
  Filled 2014-05-28 (×2): qty 1

## 2014-05-28 MED ORDER — SIMVASTATIN 40 MG PO TABS
40.0000 mg | ORAL_TABLET | Freq: Every day | ORAL | Status: DC
  Administered 2014-05-28 – 2014-05-29 (×2): 40 mg via ORAL
  Filled 2014-05-28 (×3): qty 1

## 2014-05-28 MED ORDER — POTASSIUM CHLORIDE CRYS ER 20 MEQ PO TBCR
40.0000 meq | EXTENDED_RELEASE_TABLET | Freq: Once | ORAL | Status: AC
  Administered 2014-05-28: 40 meq via ORAL
  Filled 2014-05-28: qty 2

## 2014-05-28 MED ORDER — ACETAMINOPHEN 325 MG PO TABS
650.0000 mg | ORAL_TABLET | Freq: Four times a day (QID) | ORAL | Status: DC | PRN
  Administered 2014-05-28 – 2014-05-29 (×2): 650 mg via ORAL
  Filled 2014-05-28 (×3): qty 2

## 2014-05-28 NOTE — ED Notes (Signed)
Admitting at bedside 

## 2014-05-28 NOTE — H&P (Signed)
[facility] Admission History and Physical       [name] WUX:[id number] DOB: 05-Jul-1946 DOA: 05/28/2014  Referring physician:  EDP PCP: No primary provider on file.  Specialists:   Chief Complaint: Passed Out  HPI: [name] is a 68 y.o. female with a history of HTN, IBS, and Malignant Melanoma who was found on the floor of her garage by her husband at 4 pm when he returned home from church.  She does not remember what happened, but she apparently had fallen and hit the back of her head.  She had complaints of pain in the back of her head and in her neck following the fall.   She does not know whether she slipped and fell or passed out.   She reports having left sided weakness following the episode which has not improved.     Review of Systems:  Constitutional: No Weight Loss, No Weight Gain, Night Sweats, Fevers, Chills, Dizziness, Fatigue, or Generalized Weakness HEENT: +Headache, Difficulty Swallowing,Tooth/Dental Problems,Sore Throat,  No Sneezing, Rhinitis, Ear Ache, Nasal Congestion, or Post Nasal Drip,  Cardio-vascular:  No Chest pain, Orthopnea, PND, Edema in Lower Extremities, Anasarca, Dizziness, Palpitations  Resp: No Dyspnea, No DOE, No Productive Cough, No Non-Productive Cough, No Hemoptysis, No Wheezing.    GI: No Heartburn, Indigestion, Abdominal Pain, Nausea, Vomiting, Diarrhea, Hematemesis, Hematochezia, Melena, Change in Bowel Habits,  Loss of Appetite  GU: No Dysuria, Change in Color of Urine, No Urgency or Frequency, No Flank pain.  Musculoskeletal: No Joint Pain or Swelling, No Decreased Range of Motion, No Back Pain.  Neurologic: No Syncope, No Seizures, + Left Sided Weakness, Paresthesia, Vision Disturbance or Loss, No Diplopia, No Vertigo, No Difficulty Walking,  Skin: No Rash or Lesions. Psych: No Change in Mood or Affect, No Depression or Anxiety, No Memory loss, No Confusion, or Hallucinations   Past Medical History  Diagnosis Date  .  Malignant melanoma   . IBS (irritable bowel syndrome)   . Hypertension     History reviewed. No pertinent past surgical history.    Prior to Admission medications   Medication Sig Start Date End Date Taking? Authorizing Provider  Acetaminophen (TYLENOL PO) Take 2 tablets by mouth daily as needed (for pain or headache).   Yes Historical Provider, MD  ALPRAZolam Duanne Moron) 1 MG tablet Take 2 mg by mouth at bedtime. 05/16/14  Yes Historical Provider, MD  aspirin EC 81 MG tablet Take 81 mg by mouth at bedtime.   Yes Historical Provider, MD  hydrochlorothiazide (HYDRODIURIL) 25 MG tablet Take 25 mg by mouth daily. 05/11/14  Yes Historical Provider, MD  Ketotifen Fumarate (ALAWAY OP) Place 5 drops into both eyes daily as needed (for allergies).   Yes Historical Provider, MD  metoprolol tartrate (LOPRESSOR) 25 MG tablet Take 25 mg by mouth 2 (two) times daily. 05/11/14  Yes Historical Provider, MD  sertraline (ZOLOFT) 50 MG tablet Take 50 mg by mouth daily. 05/06/14  Yes Historical Provider, MD  simvastatin (ZOCOR) 40 MG tablet Take 40 mg by mouth at bedtime. 03/31/14  Yes Historical Provider, MD     Allergies  Allergen Reactions  . Codeine Nausea And Vomiting  . Penicillins Swelling  . Flagyl [Metronidazole] Hives  . Sulfa Antibiotics Nausea And Vomiting    Social History:  has no tobacco, alcohol, and drug history on file.     History reviewed. No pertinent family history.     Physical Exam:  GEN:  Pleasant Well Nourished and Well  Developed 68 y.o. Caucasian female examined  and in no acute distress; cooperative with exam Filed Vitals:   05/28/14 1730 05/28/14 1800 05/28/14 1915 05/28/14 1945  BP: 140/66 158/81 153/62 136/67  Pulse: 60 66 117 56  Temp:      TempSrc:      Resp: 16 18 20 16   SpO2: 92% 94% 96% 97%   Blood pressure 136/67, pulse 56, temperature 98.3 F (36.8 C), temperature source Oral, resp. rate 16, SpO2 97.00%. PSYCH: She is alert and oriented x4; does not appear  anxious does not appear depressed; affect is normal HEENT: Normocephalic and Atraumatic, Mucous membranes pink; PERRLA; EOM intact; Fundi:  Benign;  No scleral icterus, Nares: Patent, Oropharynx: Clear, Fair Dentition,    Neck:  FROM, No Cervical Lymphadenopathy nor Thyromegaly or Carotid Bruit; No JVD; Breasts:: Not examined CHEST WALL: No tenderness CHEST: Normal respiration, clear to auscultation bilaterally HEART: Regular rate and rhythm; no murmurs rubs or gallops BACK: No kyphosis or scoliosis; No CVA tenderness ABDOMEN: Positive Bowel Sounds, Soft Non-Tender; No Masses, No Organomegaly. Rectal Exam: Not done EXTREMITIES: No Cyanosis, Clubbing, or Edema; No Ulcerations. Genitalia: not examined PULSES: 2+ and symmetric SKIN: Normal hydration no rash or ulceration CNS:  Alert and Oriented x 4, Speech Clear No Facial Droop, Left Sided Weakness, LUE 4/5 LLE 4/5,    Vascular: pulses palpable throughout    Labs on Admission:  Basic Metabolic Panel:  Recent Labs Lab 05/28/14 1706 05/28/14 1736  NA 141 141  K 3.5* 3.3*  CL 103 103  CO2 29  --   GLUCOSE 132* 136*  BUN 14 14  CREATININE 0.79 0.80  CALCIUM 8.9  --    Liver Function Tests:  Recent Labs Lab 05/28/14 1706  AST 32  ALT 35  ALKPHOS 74  BILITOT 0.4  PROT 6.3  ALBUMIN 3.5   No results found for this basename: LIPASE, AMYLASE,  in the last 168 hours No results found for this basename: AMMONIA,  in the last 168 hours CBC:  Recent Labs Lab 05/28/14 1706 05/28/14 1736  WBC 8.7  --   NEUTROABS 5.6  --   HGB 14.1 14.3  HCT 40.8 42.0  MCV 91.7  --   PLT 203  --    Cardiac Enzymes: No results found for this basename: CKTOTAL, CKMB, CKMBINDEX, TROPONINI,  in the last 168 hours  BNP (last 3 results) No results found for this basename: PROBNP,  in the last 8760 hours CBG: No results found for this basename: GLUCAP,  in the last 168 hours  Radiological Exams on Admission: Ct Head Wo Contrast  05/28/2014    CLINICAL DATA:  Syncope knee, fall.  EXAM: CT HEAD WITHOUT CONTRAST  CT CERVICAL SPINE WITHOUT CONTRAST  TECHNIQUE: Multidetector CT imaging of the head and cervical spine was performed following the standard protocol without intravenous contrast. Multiplanar CT image reconstructions of the cervical spine were also generated.  COMPARISON:  None.  FINDINGS: CT HEAD FINDINGS  The patient has a large scalp contusion posteriorly. No underlying fracture is identified. The brain appears normal without evidence of infarct, hemorrhage, mass lesion, mass effect, midline shift or abnormal extra-axial fluid collection there is no hydrocephalus or pneumocephalus. Imaged paranasal sinuses and mastoid air cells are clear.  CT CERVICAL SPINE FINDINGS  Straightening of the normal cervical lordosis is identified. Vertebral body height and alignment are maintained. There is loss of disc space height and endplate spurring at H2-9 and C6-7. Scattered facet degenerative disease  is also identified. Coarse calcification left lobe of the thyroid is incidentally noted. Lung apices are clear.  IMPRESSION: Large scalp contusion without underlying fracture or acute intracranial abnormality.  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   Electronically Signed   By: Inge Rise M.D.   On: 05/28/2014 18:33   Ct Cervical Spine Wo Contrast  05/28/2014   CLINICAL DATA:  Syncope knee, fall.  EXAM: CT HEAD WITHOUT CONTRAST  CT CERVICAL SPINE WITHOUT CONTRAST  TECHNIQUE: Multidetector CT imaging of the head and cervical spine was performed following the standard protocol without intravenous contrast. Multiplanar CT image reconstructions of the cervical spine were also generated.  COMPARISON:  None.  FINDINGS: CT HEAD FINDINGS  The patient has a large scalp contusion posteriorly. No underlying fracture is identified. The brain appears normal without evidence of infarct, hemorrhage, mass lesion,  mass effect, midline shift or abnormal extra-axial fluid collection there is no hydrocephalus or pneumocephalus. Imaged paranasal sinuses and mastoid air cells are clear.  CT CERVICAL SPINE FINDINGS  Straightening of the normal cervical lordosis is identified. Vertebral body height and alignment are maintained. There is loss of disc space height and endplate spurring at P1-0 and C6-7. Scattered facet degenerative disease is also identified. Coarse calcification left lobe of the thyroid is incidentally noted. Lung apices are clear.  IMPRESSION: Large scalp contusion without underlying fracture or acute intracranial abnormality.  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   Electronically Signed   By: Inge Rise M.D.   On: 05/28/2014 18:33     EKG: Independently reviewed. Sinus Bradycardia at 58   Assessment/Plan:   68 y.o. female with  Principal Problem:   1.   Left-sided weakness   CVA Workup, Neuro Checks, MRI/MRA , Carotid US, 2D ECHO in AM,      Fasting Lipids, and HbA1C in AM   ASA Rx   Neurolgy Consult   Active Problems:   2.   Syncope and collapse   Syncope Workup   Cardiac Monitoring   Check Orthostatics     3.   Hypertension   Continue  HCTZ, Metoprolol Rx   Monitor BPs     4.   Scalp Contusion    Pain Control PRN       5.   Hypokalemia- due to diuretic Rx   Replete K+     6.  DVT prophylaxis   Lovenox     Code Status:  FULL CODE  Family Communication:  Husband at Bedside   Disposition Plan:    Inpatient  Time spent:  [place] [facility] Pager [phone]   If [place] Please Contact the Day Rounding Team MD for [facility]  If 7PM-7AM, Please Contact night-coverage  [email or web address] Password [email or web address] 05/28/2014, 8:13 PM

## 2014-05-28 NOTE — ED Notes (Signed)
C-collar removed per verbal from Dr. Tyrone Nine.

## 2014-05-28 NOTE — ED Provider Notes (Signed)
I saw and evaluated the patient, reviewed the resident's note and I agree with the findings and plan.   EKG Interpretation   Date/Time:  Sunday May 28 2014 16:54:51 EDT Ventricular Rate:  58 PR Interval:  167 QRS Duration: 96 QT Interval:  457 QTC Calculation: 449 R Axis:   6 Text Interpretation:  Sinus rhythm Abnormal R-wave progression, early  transition Confirmed by Alvino Chapel  MD, Ovid Curd [phone]) on 05/28/2014  11:18:08 PM     Patient with syncope. She did hit her head but does not know the events surrounding it. Has had some. We'll neurologic deficits. Head CT and cervical spine CT reassuring. Will admit to internal medicine  Jasper Riling. Alvino Chapel, MD 05/28/14 [phone]

## 2014-05-28 NOTE — ED Provider Notes (Signed)
CSN: [id number]     Arrival date & time 05/28/14  1648 History   First MD Initiated Contact with Patient 05/28/14 1649     Chief Complaint  Patient presents with  . Loss of Consciousness     (Consider location/radiation/quality/duration/timing/severity/associated sxs/prior Treatment) Patient is a 68 y.o. female presenting with fall. The history is provided by the patient, the EMS personnel and the spouse.  Fall This is a new problem. The current episode started today. The problem has been unchanged. Associated symptoms include headaches, neck pain and weakness (L sided). Pertinent negatives include no arthralgias, chest pain, chills, congestion, fever, myalgias, nausea or vomiting. Nothing aggravates the symptoms. She has tried nothing for the symptoms. The treatment provided no relief.   68 yo F with a chief complaint of a fall. Patient was in a house by herself does not recall the incident. Patient called her husband about 30 minutes after he left the house in told him that he should call and hit her head. EMS arrived on the scene said that she was having left-sided facial droop and right side grip strength weakness. On arrival here patient having mild left-sided grip strength weakness as well as left-sided she always. Patient with subjective decreased sensation to the left arm. Patient with unknown LOC.  History of melena melanoma. Noted to have a left breast lump by EMS.  Past Medical History  Diagnosis Date  . Malignant melanoma   . IBS (irritable bowel syndrome)   . Hypertension    History reviewed. No pertinent past surgical history. History reviewed. No pertinent family history. History  Substance Use Topics  . Smoking status: Not on file  . Smokeless tobacco: Not on file  . Alcohol Use: Not on file   OB History   Grav Para Term Preterm Abortions TAB SAB Ect Mult Living                 Review of Systems  Constitutional: Negative for fever and chills.  HENT: Negative for  congestion and rhinorrhea.        Neck pain, left-sided jaw pain. Feels that her teeth dont fit together   Eyes: Negative for redness and visual disturbance.  Respiratory: Negative for shortness of breath and wheezing.   Cardiovascular: Negative for chest pain and palpitations.  Gastrointestinal: Negative for nausea and vomiting.  Genitourinary: Negative for dysuria and urgency.  Musculoskeletal: Positive for neck pain. Negative for arthralgias and myalgias.  Skin: Negative for pallor and wound.  Neurological: Positive for weakness (L sided) and headaches. Negative for dizziness, syncope and speech difficulty.      Allergies  Codeine; Penicillins; Flagyl; and Sulfa antibiotics  Home Medications   Prior to Admission medications   Medication Sig Start Date End Date Taking? Authorizing Provider  Acetaminophen (TYLENOL PO) Take 2 tablets by mouth daily as needed (for pain or headache).   Yes Historical Provider, MD  ALPRAZolam Duanne Moron) 1 MG tablet Take 2 mg by mouth at bedtime. 05/16/14  Yes Historical Provider, MD  aspirin EC 81 MG tablet Take 81 mg by mouth at bedtime.   Yes Historical Provider, MD  hydrochlorothiazide (HYDRODIURIL) 25 MG tablet Take 25 mg by mouth daily. 05/11/14  Yes Historical Provider, MD  Ketotifen Fumarate (ALAWAY OP) Place 5 drops into both eyes daily as needed (for allergies).   Yes Historical Provider, MD  metoprolol tartrate (LOPRESSOR) 25 MG tablet Take 25 mg by mouth 2 (two) times daily. 05/11/14  Yes Historical Provider, MD  sertraline (  ZOLOFT) 50 MG tablet Take 50 mg by mouth daily. 05/06/14  Yes Historical Provider, MD  simvastatin (ZOCOR) 40 MG tablet Take 40 mg by mouth at bedtime. 03/31/14  Yes Historical Provider, MD   BP 136/67  Pulse 56  Temp(Src) 98.3 F (36.8 C) (Oral)  Resp 16  SpO2 97% Physical Exam  Constitutional: She is oriented to person, place, and time. She appears well-developed and well-nourished. No distress.  HENT:  Head:  Normocephalic.  A large hematoma noted to the occiput. No noted bleeding.  Eyes: EOM are normal. Pupils are equal, round, and reactive to light.  Neck: Normal range of motion. Neck supple.  Cardiovascular: Normal rate and regular rhythm.  Exam reveals no gallop and no friction rub.   No murmur heard. Pulmonary/Chest: Effort normal. She has no wheezes. She has no rales.  Abdominal: Soft. She exhibits no distension. There is no tenderness. There is no rebound and no guarding.  Musculoskeletal: She exhibits no edema and no tenderness.  No step-offs or deformities of the T. and L-spine. No tenderness to palpation of the T. and L-spine. Patient complaining of midline spinal tenderness of the C-spine worst at C6-C7.  No palpable chest wall tenderness. No abdominal  tenderness.  Neurological: She is alert and oriented to person, place, and time.  Skin: Skin is warm and dry. She is not diaphoretic.  Psychiatric: She has a normal mood and affect. Her behavior is normal.    ED Course  Procedures (including critical care time) Labs Review Labs Reviewed  COMPREHENSIVE METABOLIC PANEL - Abnormal; Notable for the following:    Potassium 3.5 (*)    Glucose, Bld 132 (*)    GFR calc non Af Amer 84 (*)    All other components within normal limits  I-STAT CHEM 8, ED - Abnormal; Notable for the following:    Potassium 3.3 (*)    Glucose, Bld 136 (*)    All other components within normal limits  ETHANOL  PROTIME-INR  APTT  CBC  DIFFERENTIAL  URINE RAPID DRUG SCREEN (HOSP PERFORMED)  URINALYSIS, ROUTINE W REFLEX MICROSCOPIC  I-STAT TROPOININ, ED    Imaging Review Ct Head Wo Contrast  05/28/2014   CLINICAL DATA:  Syncope knee, fall.  EXAM: CT HEAD WITHOUT CONTRAST  CT CERVICAL SPINE WITHOUT CONTRAST  TECHNIQUE: Multidetector CT imaging of the head and cervical spine was performed following the standard protocol without intravenous contrast. Multiplanar CT image reconstructions of the cervical  spine were also generated.  COMPARISON:  None.  FINDINGS: CT HEAD FINDINGS  The patient has a large scalp contusion posteriorly. No underlying fracture is identified. The brain appears normal without evidence of infarct, hemorrhage, mass lesion, mass effect, midline shift or abnormal extra-axial fluid collection there is no hydrocephalus or pneumocephalus. Imaged paranasal sinuses and mastoid air cells are clear.  CT CERVICAL SPINE FINDINGS  Straightening of the normal cervical lordosis is identified. Vertebral body height and alignment are maintained. There is loss of disc space height and endplate spurring at K0-8 and C6-7. Scattered facet degenerative disease is also identified. Coarse calcification left lobe of the thyroid is incidentally noted. Lung apices are clear.  IMPRESSION: Large scalp contusion without underlying fracture or acute intracranial abnormality.  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   Electronically Signed   By: Inge Rise M.D.   On: 05/28/2014 18:33   Ct Cervical Spine Wo Contrast  05/28/2014   CLINICAL DATA:  Syncope  knee, fall.  EXAM: CT HEAD WITHOUT CONTRAST  CT CERVICAL SPINE WITHOUT CONTRAST  TECHNIQUE: Multidetector CT imaging of the head and cervical spine was performed following the standard protocol without intravenous contrast. Multiplanar CT image reconstructions of the cervical spine were also generated.  COMPARISON:  None.  FINDINGS: CT HEAD FINDINGS  The patient has a large scalp contusion posteriorly. No underlying fracture is identified. The brain appears normal without evidence of infarct, hemorrhage, mass lesion, mass effect, midline shift or abnormal extra-axial fluid collection there is no hydrocephalus or pneumocephalus. Imaged paranasal sinuses and mastoid air cells are clear.  CT CERVICAL SPINE FINDINGS  Straightening of the normal cervical lordosis is identified. Vertebral body height and alignment are  maintained. There is loss of disc space height and endplate spurring at G6-2 and C6-7. Scattered facet degenerative disease is also identified. Coarse calcification left lobe of the thyroid is incidentally noted. Lung apices are clear.  IMPRESSION: Large scalp contusion without underlying fracture or acute intracranial abnormality.  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   Electronically Signed   By: Inge Rise M.D.   On: 05/28/2014 18:33     EKG Interpretation None      MDM   Final diagnoses:  None    68 yo F with a chief complaint of a fall. Some concern for decreased strength in the left upper extremity. We'll obtain a CT head C-spine. Neurology will come and evaluate.  Patient with negative CT and a CT C spine without workup unremarkable. We'll admit the patient for TIA/stroke workup    Deno Etienne, MD 05/28/14 [phone]

## 2014-05-28 NOTE — ED Notes (Signed)
Per EMS: pt with syncopal episode at home.  Unknown when fall occurred or how long pt was unconscious for.  Pt reports husband going to church at 3pm.  Husband fount pt at home with lump to the back of her head around 4pm.  Pt noted to be 90% on RA; pt placed on 2L [place].  Pt reporting blurred vision. Pt with right-sided weakness on exam.  C-collar placed on pt on arrival.

## 2014-05-28 NOTE — ED Notes (Signed)
Pt still reporting weakness to left arm; strength not as strong as in right arm.

## 2014-05-29 ENCOUNTER — Observation Stay (HOSPITAL_COMMUNITY): Payer: Medicare Other

## 2014-05-29 DIAGNOSIS — IMO0002 Reserved for concepts with insufficient information to code with codable children: Secondary | ICD-10-CM

## 2014-05-29 DIAGNOSIS — M6281 Muscle weakness (generalized): Secondary | ICD-10-CM

## 2014-05-29 DIAGNOSIS — I369 Nonrheumatic tricuspid valve disorder, unspecified: Secondary | ICD-10-CM

## 2014-05-29 DIAGNOSIS — I10 Essential (primary) hypertension: Secondary | ICD-10-CM

## 2014-05-29 LAB — LIPID PANEL
Cholesterol: 139 mg/dL (ref 0–200)
HDL: 34 mg/dL — ABNORMAL LOW (ref >39)
LDL Cholesterol: 66 mg/dL (ref 0–99)
Total CHOL/HDL Ratio: 4.1 RATIO
Triglycerides: 193 mg/dL — ABNORMAL HIGH (ref <150)
VLDL: 39 mg/dL (ref 0–40)

## 2014-05-29 LAB — HEMOGLOBIN A1C
Hgb A1c MFr Bld: 5.8 % — ABNORMAL HIGH (ref <5.7)
Mean Plasma Glucose: 120 mg/dL — ABNORMAL HIGH (ref <117)

## 2014-05-29 MED ORDER — PROCHLORPERAZINE EDISYLATE 5 MG/ML IJ SOLN
10.0000 mg | Freq: Four times a day (QID) | INTRAMUSCULAR | Status: DC | PRN
  Filled 2014-05-29: qty 2

## 2014-05-29 MED ORDER — ONDANSETRON HCL 4 MG/2ML IJ SOLN
4.0000 mg | Freq: Four times a day (QID) | INTRAMUSCULAR | Status: DC | PRN
  Administered 2014-05-29: 4 mg via INTRAVENOUS
  Filled 2014-05-29: qty 2

## 2014-05-29 MED ORDER — KETOROLAC TROMETHAMINE 30 MG/ML IJ SOLN
30.0000 mg | Freq: Once | INTRAMUSCULAR | Status: AC
  Administered 2014-05-29: 30 mg via INTRAVENOUS
  Filled 2014-05-29: qty 1

## 2014-05-29 MED ORDER — IBUPROFEN 400 MG PO TABS
400.0000 mg | ORAL_TABLET | ORAL | Status: DC | PRN
  Administered 2014-05-30 (×2): 400 mg via ORAL
  Filled 2014-05-29 (×3): qty 1

## 2014-05-29 MED ORDER — OXYCODONE HCL 5 MG PO TABS
5.0000 mg | ORAL_TABLET | ORAL | Status: DC | PRN
  Administered 2014-05-30: 5 mg via ORAL
  Filled 2014-05-29: qty 1

## 2014-05-29 NOTE — Consult Note (Signed)
Referring Physician: Dr. Wynelle Cleveland    Chief Complaint: left sided weakness s/p fall.  HPI:                                                                                                                                         [name] is an 68 y.o. female with a past medical history significant for HTN, malignant melanoma, IBS, admitted to the hospital after sustaining a fall yesterday and found to have left sided weakness on initial assessment. Patient said that she just remember being home by herself and walking when suddenly and without any warnings she felt backwards and hit the concrete. She doesn't recall for how long she was out but she was able to get up by herself and maneuver to go to her bed and called her husband who was at church. According to her husband, when he got home she was lying in bed moaning and complaining of being sore but was not confused and he didn't notice bladder or bowel incontinence, or tongue biting. EMS was summoned and she was answering question appropriately but some left sided weakness and left facial drop was noted. Initial impression in the ED was syncope. CT brain and C spine revealed no acute intracranial abnormality.  Date last known well: 05/28/14 Time last known well: unable to determine  tPA Given: no, out of the window, unknown time of onset   Past Medical History  Diagnosis Date  . Malignant melanoma   . IBS (irritable bowel syndrome)   . Hypertension     History reviewed. No pertinent past surgical history.  History reviewed. No pertinent family history. Social History:  reports that she has never smoked. She does not have any smokeless tobacco history on file. She reports that she does not drink alcohol or use illicit drugs.  Allergies:  Allergies  Allergen Reactions  . Codeine Nausea And Vomiting  . Penicillins Swelling  . Flagyl [Metronidazole] Hives  . Sulfa Antibiotics Nausea And Vomiting    Medications:                                                                                                                            I have reviewed the patient's current medications. Scheduled: .  stroke: mapping our early stages of recovery book   Does not apply Once  . ALPRAZolam  2 mg Oral QHS  . aspirin  300 mg Rectal Daily   Or  . aspirin  325 mg Oral Daily  . enoxaparin (LOVENOX) injection  40 mg Subcutaneous Q24H  . hydrochlorothiazide  25 mg Oral Daily  . metoprolol tartrate  25 mg Oral BID  . sertraline  50 mg Oral Daily  . simvastatin  40 mg Oral QHS    ROS:                                                                                                                                       History obtained from the patient, husband, and chart review.  General ROS: negative for - chills, fatigue, fever, night sweats, weight gain or weight loss Psychological ROS: negative for - behavioral disorder, hallucinations, memory difficulties, mood swings or suicidal ideation Ophthalmic ROS: negative for - blurry vision, double vision, eye pain or loss of vision ENT ROS: negative for - epistaxis, nasal discharge, oral lesions, sore throat, tinnitus or vertigo Allergy and Immunology ROS: negative for - hives or itchy/watery eyes Hematological and Lymphatic ROS: negative for - bleeding problems, bruising or swollen lymph nodes Endocrine ROS: negative for - galactorrhea, hair pattern changes, polydipsia/polyuria or temperature intolerance Respiratory ROS: negative for - cough, hemoptysis, shortness of breath or wheezing Cardiovascular ROS: negative for - chest pain, dyspnea on exertion, edema or irregular heartbeat Gastrointestinal ROS: negative for - abdominal pain, diarrhea, hematemesis, nausea/vomiting or stool incontinence Genito-Urinary ROS: negative for - dysuria, hematuria, incontinence or urinary frequency/urgency Musculoskeletal ROS: negative for - joint swelling Neurological ROS: as noted in  HPI Dermatological ROS: negative for rash and skin lesion changes  Physical exam: pleasant female complaining of generalized body.Blood pressure 159/59, pulse 59, temperature 98.9 F (37.2 C), temperature source Oral, resp. rate 18, height '5\' 4"'  (1.626 m), weight 85.412 kg (188 lb 4.8 oz), SpO2 97.00%.   Head: normocephalic. Neck: supple, no bruits, no JVD. Cardiac: no murmurs. Lungs: clear. Abdomen: soft, no tender, no mass. Extremities: no edema. Neurologic Examination:                                                                                                      General: Mental Status: Alert, oriented, thought content appropriate.  Speech fluent without evidence of aphasia.  Able to follow 3 step commands without difficulty. Cranial Nerves: II: Discs flat bilaterally; Visual fields grossly normal, pupils equal, round, reactive to light and accommodation III,IV, VI: ptosis not present, extra-ocular motions intact  bilaterally V,VII: smile symmetric, facial light touch sensation normal bilaterally VIII: hearing normal bilaterally IX,X: gag reflex present XI: bilateral shoulder shrug XII: midline tongue extension without atrophy or fasciculations  Motor: Significant for subtle left sided weakness. Tone and bulk:normal tone throughout; no atrophy noted Sensory: Pinprick and light touch intact throughout, bilaterally Deep Tendon Reflexes:  1 all over Plantars: Right: downgoing   Left: downgoing Cerebellar: normal finger-to-nose,  normal heel-to-shin test Gait: No tested  Results for orders placed during the hospital encounter of 05/28/14 (from the past 48 hour(s))  ETHANOL     Status: None   Collection Time    05/28/14  5:06 PM      Result Value Ref Range   Alcohol, Ethyl (B) <11  0 - 11 mg/dL   Comment:            LOWEST DETECTABLE LIMIT FOR     SERUM ALCOHOL IS 11 mg/dL     FOR MEDICAL PURPOSES ONLY  PROTIME-INR     Status: None   Collection Time    05/28/14   5:06 PM      Result Value Ref Range   Prothrombin Time 13.1  11.6 - 15.2 seconds   INR 0.99  0.00 - 1.49  APTT     Status: None   Collection Time    05/28/14  5:06 PM      Result Value Ref Range   aPTT 30  24 - 37 seconds  CBC     Status: None   Collection Time    05/28/14  5:06 PM      Result Value Ref Range   WBC 8.7  4.0 - 10.5 K/uL   RBC 4.45  3.87 - 5.11 MIL/uL   Hemoglobin 14.1  12.0 - 15.0 g/dL   HCT 40.8  36.0 - 46.0 %   MCV 91.7  78.0 - 100.0 fL   MCH 31.7  26.0 - 34.0 pg   MCHC 34.6  30.0 - 36.0 g/dL   RDW 12.9  11.5 - 15.5 %   Platelets 203  150 - 400 K/uL  DIFFERENTIAL     Status: None   Collection Time    05/28/14  5:06 PM      Result Value Ref Range   Neutrophils Relative % 63  43 - 77 %   Neutro Abs 5.6  1.7 - 7.7 K/uL   Lymphocytes Relative 23  12 - 46 %   Lymphs Abs 2.0  0.7 - 4.0 K/uL   Monocytes Relative 9  3 - 12 %   Monocytes Absolute 0.8  0.1 - 1.0 K/uL   Eosinophils Relative 4  0 - 5 %   Eosinophils Absolute 0.3  0.0 - 0.7 K/uL   Basophils Relative 1  0 - 1 %   Basophils Absolute 0.1  0.0 - 0.1 K/uL  COMPREHENSIVE METABOLIC PANEL     Status: Abnormal   Collection Time    05/28/14  5:06 PM      Result Value Ref Range   Sodium 141  137 - 147 mEq/L   Potassium 3.5 (*) 3.7 - 5.3 mEq/L   Chloride 103  96 - 112 mEq/L   CO2 29  19 - 32 mEq/L   Glucose, Bld 132 (*) 70 - 99 mg/dL   BUN 14  6 - 23 mg/dL   Creatinine, Ser 0.79  0.50 - 1.10 mg/dL   Calcium 8.9  8.4 - 10.5 mg/dL   Total Protein 6.3  6.0 - 8.3 g/dL   Albumin 3.5  3.5 - 5.2 g/dL   AST 32  0 - 37 U/L   ALT 35  0 - 35 U/L   Alkaline Phosphatase 74  39 - 117 U/L   Total Bilirubin 0.4  0.3 - 1.2 mg/dL   GFR calc non Af Amer 84 (*) >90 mL/min   GFR calc Af Amer >90  >90 mL/min   Comment: (NOTE)     The eGFR has been calculated using the CKD EPI equation.     This calculation has not been validated in all clinical situations.     eGFR's persistently <90 mL/min signify possible Chronic  Kidney     Disease.   Anion gap 9  5 - 15  I-STAT TROPOININ, ED     Status: None   Collection Time    05/28/14  5:35 PM      Result Value Ref Range   Troponin i, poc 0.00  0.00 - 0.08 ng/mL   Comment 3            Comment: Due to the release kinetics of cTnI,     a negative result within the first hours     of the onset of symptoms does not rule out     myocardial infarction with certainty.     If myocardial infarction is still suspected,     repeat the test at appropriate intervals.  I-STAT CHEM 8, ED     Status: Abnormal   Collection Time    05/28/14  5:36 PM      Result Value Ref Range   Sodium 141  137 - 147 mEq/L   Potassium 3.3 (*) 3.7 - 5.3 mEq/L   Chloride 103  96 - 112 mEq/L   BUN 14  6 - 23 mg/dL   Creatinine, Ser 0.80  0.50 - 1.10 mg/dL   Glucose, Bld 136 (*) 70 - 99 mg/dL   Calcium, Ion 1.14  1.13 - 1.30 mmol/L   TCO2 27  0 - 100 mmol/L   Hemoglobin 14.3  12.0 - 15.0 g/dL   HCT 42.0  36.0 - 46.0 %  URINE RAPID DRUG SCREEN (HOSP PERFORMED)     Status: Abnormal   Collection Time    05/28/14  9:50 PM      Result Value Ref Range   Opiates NONE DETECTED  NONE DETECTED   Cocaine NONE DETECTED  NONE DETECTED   Benzodiazepines POSITIVE (*) NONE DETECTED   Amphetamines NONE DETECTED  NONE DETECTED   Tetrahydrocannabinol NONE DETECTED  NONE DETECTED   Barbiturates NONE DETECTED  NONE DETECTED   Comment:            DRUG SCREEN FOR MEDICAL PURPOSES     ONLY.  IF CONFIRMATION IS NEEDED     FOR ANY PURPOSE, NOTIFY LAB     WITHIN 5 DAYS.                LOWEST DETECTABLE LIMITS     FOR URINE DRUG SCREEN     Drug Class       Cutoff (ng/mL)     Amphetamine      1000     Barbiturate      200     Benzodiazepine   194     Tricyclics       174     Opiates          300     Cocaine  300     THC              50  URINALYSIS, ROUTINE W REFLEX MICROSCOPIC     Status: None   Collection Time    05/28/14  9:50 PM      Result Value Ref Range   Color, Urine YELLOW  YELLOW    APPearance CLEAR  CLEAR   Specific Gravity, Urine 1.019  1.005 - 1.030   pH 6.5  5.0 - 8.0   Glucose, UA NEGATIVE  NEGATIVE mg/dL   Hgb urine dipstick NEGATIVE  NEGATIVE   Bilirubin Urine NEGATIVE  NEGATIVE   Ketones, ur NEGATIVE  NEGATIVE mg/dL   Protein, ur NEGATIVE  NEGATIVE mg/dL   Urobilinogen, UA 0.2  0.0 - 1.0 mg/dL   Nitrite NEGATIVE  NEGATIVE   Leukocytes, UA NEGATIVE  NEGATIVE   Comment: MICROSCOPIC NOT DONE ON URINES WITH NEGATIVE PROTEIN, BLOOD, LEUKOCYTES, NITRITE, OR GLUCOSE <1000 mg/dL.  LIPID PANEL     Status: Abnormal   Collection Time    05/29/14  4:24 AM      Result Value Ref Range   Cholesterol 139  0 - 200 mg/dL   Triglycerides 193 (*) <150 mg/dL   HDL 34 (*) >39 mg/dL   Total CHOL/HDL Ratio 4.1     VLDL 39  0 - 40 mg/dL   LDL Cholesterol 66  0 - 99 mg/dL   Comment:            Total Cholesterol/HDL:CHD Risk     Coronary Heart Disease Risk Table                         Men   Women      1/2 Average Risk   3.4   3.3      Average Risk       5.0   4.4      2 X Average Risk   9.6   7.1      3 X Average Risk  23.4   11.0                Use the calculated Patient Ratio     above and the CHD Risk Table     to determine the patient's CHD Risk.                ATP III CLASSIFICATION (LDL):      <100     mg/dL   Optimal      100-129  mg/dL   Near or Above                        Optimal      130-159  mg/dL   Borderline      160-189  mg/dL   High      >190     mg/dL   Very High   Ct Head Wo Contrast  05/28/2014   CLINICAL DATA:  Syncope knee, fall.  EXAM: CT HEAD WITHOUT CONTRAST  CT CERVICAL SPINE WITHOUT CONTRAST  TECHNIQUE: Multidetector CT imaging of the head and cervical spine was performed following the standard protocol without intravenous contrast. Multiplanar CT image reconstructions of the cervical spine were also generated.  COMPARISON:  None.  FINDINGS: CT HEAD FINDINGS  The patient has a large scalp contusion posteriorly. No underlying fracture is  identified. The brain appears normal without evidence of infarct, hemorrhage, mass lesion, mass effect,  midline shift or abnormal extra-axial fluid collection there is no hydrocephalus or pneumocephalus. Imaged paranasal sinuses and mastoid air cells are clear.  CT CERVICAL SPINE FINDINGS  Straightening of the normal cervical lordosis is identified. Vertebral body height and alignment are maintained. There is loss of disc space height and endplate spurring at Y8-1 and C6-7. Scattered facet degenerative disease is also identified. Coarse calcification left lobe of the thyroid is incidentally noted. Lung apices are clear.  IMPRESSION: Large scalp contusion without underlying fracture or acute intracranial abnormality.  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   Electronically Signed   By: Inge Rise M.D.   On: 05/28/2014 18:33   Ct Cervical Spine Wo Contrast  05/28/2014   CLINICAL DATA:  Syncope knee, fall.  EXAM: CT HEAD WITHOUT CONTRAST  CT CERVICAL SPINE WITHOUT CONTRAST  TECHNIQUE: Multidetector CT imaging of the head and cervical spine was performed following the standard protocol without intravenous contrast. Multiplanar CT image reconstructions of the cervical spine were also generated.  COMPARISON:  None.  FINDINGS: CT HEAD FINDINGS  The patient has a large scalp contusion posteriorly. No underlying fracture is identified. The brain appears normal without evidence of infarct, hemorrhage, mass lesion, mass effect, midline shift or abnormal extra-axial fluid collection there is no hydrocephalus or pneumocephalus. Imaged paranasal sinuses and mastoid air cells are clear.  CT CERVICAL SPINE FINDINGS  Straightening of the normal cervical lordosis is identified. Vertebral body height and alignment are maintained. There is loss of disc space height and endplate spurring at K4-8 and C6-7. Scattered facet degenerative disease is also identified. Coarse  calcification left lobe of the thyroid is incidentally noted. Lung apices are clear.  IMPRESSION: Large scalp contusion without underlying fracture or acute intracranial abnormality.  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   Electronically Signed   By: Inge Rise M.D.   On: 05/28/2014 18:33     Assessment: 68 y.o. female with subtle left sided weakness after sustaining a fall with LOC for ? period of time. Agree that she could certainly had had a seizure with resulting left Todd's paralysis that is steadily improving, or small right brain stroke. Patient already scheduled for MRI. Will also suggest EEG. Will follow up.   Dorian Pod, MD [facility]

## 2014-05-29 NOTE — Evaluation (Signed)
Physical Therapy Evaluation Patient Details Name: [name] MRN: [id number] DOB: 02-01-1946 Today's Date: 05/29/2014   History of Present Illness  [name] is a 68 y.o. female with a history of HTN, IBS, and Malignant Melanoma who was found by her husband when he returned home from church.  She does not remember what happened, but she apparently had fallen and hit the back of her head. She does not know whether she slipped and fell or passed out.   She reports having left sided weakness following the episode   Clinical Impression  Pt very pleasant lady appearing overwhelmed with realization of deficits during mobility. Pt does not recall events leading to admission and states some mild word finding deficits at baseline. Pt with noted mild weakness left side, impaired balance, double vision inconsistently throughout evaluation, nystagmus with turning, and with head thrust dizziness and loss of target. On initial visual tracking pt with loss of target particularly in inferior field. Pt will benefit from acute therapy to maximize mobility, balance, strength and function to return pt to PLOF. Will also continue with further vestibular assessment, pt overwhelmed and unable to tolerate further assessment at this time.     Follow Up Recommendations CIR (pending progression and MRI)    Equipment Recommendations  None recommended by PT    Recommendations for Other Services       Precautions / Restrictions Precautions Precautions: Fall      Mobility  Bed Mobility Overal bed mobility: Modified Independent             General bed mobility comments: needed rail and increased time to complete  Transfers Overall transfer level: Needs assistance   Transfers: Sit to/from Stand Sit to Stand: Min guard         General transfer comment: guarding for safety due to impaired balance  Ambulation/Gait Ambulation/Gait assistance: Min assist Ambulation Distance (Feet): 180  Feet Assistive device: 1 person hand held assist Gait Pattern/deviations: Step-through pattern;Decreased stride length   Gait velocity interpretation: Below normal speed for age/gender General Gait Details: pt with slow cautious gait with very focused gaze, with head turns increased unsteadiness with assist to maintain balance, with 180 degree turn mod assist to maintain balance and noted 4 beats of nystagmus with pt reporting dizziness and return of double vision  Stairs            Wheelchair Mobility    Modified Rankin (Stroke Patients Only) Modified Rankin (Stroke Patients Only) Pre-Morbid Rankin Score: No symptoms Modified Rankin: Moderate disability     Balance Overall balance assessment: Needs assistance   Sitting balance-Leahy Scale: Good       Standing balance-Leahy Scale: Fair                               Pertinent Vitals/Pain Pain Assessment: 0-10 Pain Score: 7  Pain Location: head Pain Descriptors / Indicators: Aching Pain Intervention(s): Repositioned;RN gave pain meds during session    [place] expects to be discharged to:: Private residence Living Arrangements: Spouse/significant other Available Help at Discharge: Family;Available 24 hours/day Type of Home: House Home Access: Stairs to enter   [employer] of Steps: 1 Home Layout: One level Home Equipment: Walker - 2 wheels;Cane - single point;Bedside commode Additional Comments: equipment fron spouse tKA    Prior Function Level of Independence: Independent               Hand  Dominance        Extremity/Trunk Assessment   Upper Extremity Assessment: Defer to OT evaluation;LUE deficits/detail       LUE Deficits / Details: generalized decreased strength grossly 3-4/5 formally defer to OT   Lower Extremity Assessment: LLE deficits/detail;RLE deficits/detail RLE Deficits / Details: 4+-5/5 all myotomes LLE Deficits / Details: 3/5 hip flexion,  knee flexion and extension. Sensation WFL  Cervical / Trunk Assessment: Normal  Communication   Communication: No difficulties;Other (comment) (pt with mild word finding deficits)  Cognition Arousal/Alertness: Awake/alert Behavior During Therapy: WFL for tasks assessed/performed Overall Cognitive Status: Within Functional Limits for tasks assessed                      General Comments      Exercises        Assessment/Plan    PT Assessment Patient needs continued PT services  PT Diagnosis Abnormality of gait;Hemiplegia non-dominant side   PT Problem List Decreased strength;Decreased activity tolerance;Decreased balance;Decreased mobility;Pain;Decreased safety awareness;Decreased knowledge of use of DME  PT Treatment Interventions Gait training;Stair training;Functional mobility training;Therapeutic activities;Therapeutic exercise;Patient/family education;Neuromuscular re-education;Balance training;DME instruction   PT Goals (Current goals can be found in the Care Plan section) Acute Rehab PT Goals Patient Stated Goal: return to sewing PT Goal Formulation: With patient Time For Goal Achievement: 06/12/14 Potential to Achieve Goals: Good    Frequency Min 4X/week   Barriers to discharge        Co-evaluation               End of Session Equipment Utilized During Treatment: Gait belt Activity Tolerance: Patient tolerated treatment well Patient left: in chair;with call bell/phone within reach;with chair alarm set;with family/visitor present Nurse Communication: Mobility status;Precautions    Functional Assessment Tool Used: clinical judgement Functional Limitation: Mobility: Walking and moving around Mobility: Walking and Moving Around Current Status [phone]): At least 20 percent but less than 40 percent impaired, limited or restricted Mobility: Walking and Moving Around Goal Status [phone]): At least 1 percent but less than 20 percent impaired, limited or  restricted    Time: 0750-0819 PT Time Calculation (min): 29 min   Charges:   PT Evaluation $Initial PT Evaluation Tier I: 1 Procedure PT Treatments $Therapeutic Activity: 8-22 mins   PT G Codes:   Functional Assessment Tool Used: clinical judgement Functional Limitation: Mobility: Walking and moving around    Melford Aase 05/29/2014, 8:27 AM Elwyn Reach, [place]

## 2014-05-29 NOTE — Progress Notes (Signed)
[facility] Progress Note   [name] LGX:[id number] DOB: Nov 26, 1945 DOA: 05/28/2014 PCP: No primary provider on file.  Brief narrative: [name] is a 68 y.o. female  with HTN, IBS and malignant melanoma who lost consciouness yesterday. She last remembers walking down the hall in her home. She then recalled knowing that she had fallen and feeling unwell. she walked to the bedroom and called her husband. She was noted to have a large scalp contusion and she cannot remember how it happened. Her husband states she appeared confused. She was noted to have left sided weakness after regaining consciousness and was brought in to the ER and admitted. .    Subjective: Feels a bit lightheaded and nauseated. Headache is quite bad.  No other complaints.   Assessment/Plan: Left sided weakness  -MRI evaluate for CVA is negative- have asked Neuro to evaluate as well.    Syncope  - Orthostatic vitals negative.  - follow on tele for arrhthymias, f/u on ECHO  HTN  - cont home meds for now   Hypokalemia  - replace and follow   Vomiting - began this morning after I evaluated her (called by RN) - try Zofran and Phenergan  Scalp contusion  - monitor  - per husband, it has greatly reduced in size from yesterday   Code Status: full code Family Communication: husband at bedside Disposition Plan: to be determined DVT prophylaxis: Lovenox  Consultants: Neuro  Procedures: none  Antibiotics: Anti-infectives   None      Objective: Filed Weights   05/28/14 2030  Weight: 85.412 kg (188 lb 4.8 oz)    Intake/Output Summary (Last 24 hours) at 05/29/14 0749 Last data filed at 05/28/14 2100  Gross per 24 hour  Intake      0 ml  Output    400 ml  Net   -400 ml     Vitals Filed Vitals:   05/28/14 2030 05/29/14 0500 05/29/14 0503 05/29/14 0506  BP: 163/68 115/70 132/78 140/80  Pulse: 65 67 59 72  Temp: 98.3 F (36.8 C) 98.8 F (37.1 C)    TempSrc:      Resp: 18  18    Height: 5\' 4"  (1.626 m)     Weight: 85.412 kg (188 lb 4.8 oz)     SpO2: 97% 99%      Exam: General: No acute respiratory distress Lungs: Clear to auscultation bilaterally without wheezes or crackles Cardiovascular: Regular rate and rhythm without murmur gallop or rub normal S1 and S2 Abdomen: Nontender, nondistended, soft, bowel sounds positive, no rebound, no ascites, no appreciable mass Extremities: No significant cyanosis, clubbing, or edema bilateral lower extremities Neuro: 4/5 strength in left arm and leg- 5/5 in all other extremities. Pt unsteady on her feet when evaluated by PT.   Data Reviewed: Basic Metabolic Panel:  Recent Labs Lab 05/28/14 1706 05/28/14 1736  NA 141 141  K 3.5* 3.3*  CL 103 103  CO2 29  --   GLUCOSE 132* 136*  BUN 14 14  CREATININE 0.79 0.80  CALCIUM 8.9  --    Liver Function Tests:  Recent Labs Lab 05/28/14 1706  AST 32  ALT 35  ALKPHOS 74  BILITOT 0.4  PROT 6.3  ALBUMIN 3.5   No results found for this basename: LIPASE, AMYLASE,  in the last 168 hours No results found for this basename: AMMONIA,  in the last 168 hours CBC:  Recent Labs Lab 05/28/14 1706 05/28/14 1736  WBC 8.7  --  NEUTROABS 5.6  --   HGB 14.1 14.3  HCT 40.8 42.0  MCV 91.7  --   PLT 203  --    Cardiac Enzymes: No results found for this basename: CKTOTAL, CKMB, CKMBINDEX, TROPONINI,  in the last 168 hours BNP (last 3 results) No results found for this basename: PROBNP,  in the last 8760 hours CBG: No results found for this basename: GLUCAP,  in the last 168 hours  No results found for this or any previous visit (from the past 240 hour(s)).   Studies:  Recent x-ray studies have been reviewed in detail by the Attending Physician  Scheduled Meds:  Scheduled Meds: .  stroke: mapping our early stages of recovery book   Does not apply Once  . ALPRAZolam  2 mg Oral QHS  . aspirin  300 mg Rectal Daily   Or  . aspirin  325 mg Oral Daily  .  enoxaparin (LOVENOX) injection  40 mg Subcutaneous Q24H  . hydrochlorothiazide  25 mg Oral Daily  . metoprolol tartrate  25 mg Oral BID  . sertraline  50 mg Oral Daily  . simvastatin  40 mg Oral QHS   Continuous Infusions:   Time spent on care of this patient: >35 min   [place], MD 05/29/2014, 7:49 AM  LOS: 1 day   [facility] Office  [phone] Pager - Text Page per [email or web address]  If 7PM-7AM, please contact night-coverage [email or web address]

## 2014-05-29 NOTE — Progress Notes (Signed)
VASCULAR LAB PRELIMINARY  PRELIMINARY  PRELIMINARY  PRELIMINARY  Carotid duplex  completed.    Preliminary report:  Bilateral:  1-39% ICA stenosis.  Vertebral artery flow is antegrade.      Dorion Petillo, RVT 05/29/2014, 3:46 PM

## 2014-05-29 NOTE — Progress Notes (Signed)
EEG completed; results pending.    

## 2014-05-29 NOTE — Progress Notes (Signed)
UR completed 

## 2014-05-29 NOTE — Procedures (Signed)
ELECTROENCEPHALOGRAM REPORT  Date of Study: 05/29/2014  Patient's Name: [name] MRN: [id number] Date of Birth: 12/10/1945  Referring Provider: Dr. Dorian Pod  Clinical History: This is a 68 year old woman who presented with fall, found to have left-sided weakness  Medications: Tylenol, Xanax, aspirin, Lovenox, hydrochlorothiazide, metoprolol, Zoloft, Zocor  Technical Summary: A multichannel digital EEG recording measured by the international 10-20 system with electrodes applied with paste and impedances below 5000 ohms performed in our laboratory with EKG monitoring in an awake and asleep patient.  Hyperventilation and photic stimulation were not performed.  The digital EEG was referentially recorded, reformatted, and digitally filtered in a variety of bipolar and referential montages for optimal display.    Description: The patient is awake and asleep during the recording.  During maximal wakefulness, there is a symmetric, low voltage 10 Hz posterior dominant rhythm that poorly attenuates to eye opening and eye closure. There is occasional focal 4 Hz theta slowing seen over the right temporal region. There is an excess amount of diffuse low-voltage beta activity seen maximally over the frontal regions. During drowsiness and stage I sleep, there is an increase in theta slowing of the background with rare vertex waves seen. There were no epileptiform discharges or electrographic seizures seen.    EKG lead was unremarkable.  Impression: This awake and asleep EEG is abnormal due to the presence of: 1. Occasional focal slowing over the right temporal region 2. Excess low voltage diffuse beta activity  Clinical Correlation of the above findings indicates focal cerebral dysfunction over the right temporal region suggestive of underlying structural or physiologic abnormality.  The absence of epileptiform discharges does not rule out a clinical diagnosis of epilepsy.  Excess diffuse  beta activity is typically seen with benzodiazepine use.   Ellouise Newer, M.D.

## 2014-05-29 NOTE — Evaluation (Addendum)
Occupational Therapy Evaluation Patient Details Name: [name] MRN: [id number] DOB: 30-Apr-1946 Today's Date: 05/29/2014    History of Present Illness [name] is a 68 y.o. female with a history of HTN, IBS, and Malignant Melanoma who was found by her husband when he returned home from church.  She does not remember what happened, but she apparently had fallen and hit the back of her head. She does not know whether she slipped and fell or passed out.   She reports having left sided weakness following the episode    Clinical Impression   Pt admitted with above. Pt independent with ADLs, PTA. Feel pt will benefit from acute OT to increase independence prior to d/c. Recommending Outpatient OT for further rehab to address LUE weakness and vision deficits.      Follow Up Recommendations  Outpatient OT;Supervision/Assistance - 24 hour    Equipment Recommendations  None recommended by OT    Recommendations for Other Services       Precautions / Restrictions Precautions Precautions: Fall Restrictions Weight Bearing Restrictions: No      Mobility Bed Mobility        General bed mobility comments: not asseessed  Transfers Overall transfer level: Needs assistance   Transfers: Sit to/from Stand Sit to Stand: Min guard         General transfer comment: cues for technique.         ADL Overall ADL's : Needs assistance/impaired     Grooming: Wash/dry hands;Oral care;Standing;Supervision/safety;Set up               Lower Body Dressing: Min guard;Sit to/from stand   Toilet Transfer: Ambulation;Min guard;Regular Toilet;Grab bars   Toileting- Water quality scientist and Hygiene: Min guard (sitting/standing)       Functional mobility during ADLs: Minimal assistance General ADL Comments: Educated on safety tips for home (rugs, safe shoewear, sitting for LB ADLs, pets). Discussed what could be used for shower chair. Educated on signs/symptoms of stroke and  importance of getting help. Discussed vision with pt and spouse.     Vision    Pt wears reading glasses; Reports double vision at times   Tracking/Visual Pursuits: Other (comment) (difficulty at times with tracking in inferior quadrants and on left side)   Convergence: Impaired (comment)         Perception     Praxis      Pertinent Vitals/Pain Pain Assessment: 0-10 Pain Score:  (c/o headache-not rated) Pain Location: head Pain Descriptors / Indicators: Aching Pain Intervention(s): Monitored during session     Hand Dominance Right   Extremity/Trunk Assessment Upper Extremity Assessment Upper Extremity Assessment: LUE deficits/detail LUE Deficits / Details: grossly 3+/5   Lower Extremity Assessment Lower Extremity Assessment: Defer to PT evaluation RLE Deficits / Details: 4+-5/5 all myotomes LLE Deficits / Details: 3/5 hip flexion, knee flexion and extension. Sensation WFL   Cervical / Trunk Assessment Cervical / Trunk Assessment: Normal   Communication Communication Communication: No difficulties   Cognition Arousal/Alertness: Awake/alert Behavior During Therapy: WFL for tasks assessed/performed Overall Cognitive Status: Within Functional Limits for tasks assessed                     General Comments       Exercises       Shoulder Instructions      Home Living Family/patient expects to be discharged to:: Private residence Living Arrangements: Spouse/significant other Available Help at Discharge: Family;Available 24 hours/day Type of Home: [place]  Access: Stairs to enter [employer] of Steps: 1   Home Layout: One level     Bathroom Shower/Tub: Tub/shower unit;Walk-in shower   Bathroom Toilet: Standard (sink close by)     Home Equipment: Gilford Rile - 2 wheels;Cane - single point;Bedside commode   Additional Comments: equipment fron spouse tKA      Prior Functioning/Environment Level of Independence: Independent              OT Diagnosis: Disturbance of vision;Other (comment) (left hemiparesis)   OT Problem List: Decreased strength;Impaired balance (sitting and/or standing);Impaired vision/perception;Decreased knowledge of use of DME or AE;Decreased knowledge of precautions;Pain   OT Treatment/Interventions: Self-care/ADL training;DME and/or AE instruction;Therapeutic activities;Visual/perceptual remediation/compensation;Patient/family education;Balance training;Therapeutic exercise    OT Goals(Current goals can be found in the care plan section) Acute Rehab OT Goals Patient Stated Goal: not stated OT Goal Formulation: With patient Time For Goal Achievement: 06/05/14 Potential to Achieve Goals: Good ADL Goals Pt Will Perform Lower Body Bathing: with modified independence;sit to/from stand Pt Will Perform Lower Body Dressing: with modified independence;sit to/from stand Pt Will Transfer to Toilet: with modified independence;ambulating;regular height toilet;grab bars Pt Will Perform Tub/Shower Transfer: Tub transfer;Shower transfer;with supervision;ambulating;shower seat Additional ADL Goal #1: Pt will be independent with vision HEP.  OT Frequency: Min 3X/week   Barriers to D/C:            Co-evaluation              End of Session Equipment Utilized During Treatment: Gait belt  Activity Tolerance: Patient tolerated treatment well Patient left: Other (comment) (in wheelchair; going down for MRI)   Time: [phone] OT Time Calculation (min): 29 min Charges:  OT General Charges $OT Visit: 1 Procedure OT Evaluation $Initial OT Evaluation Tier I: 1 Procedure OT Treatments $Self Care/Home Management : 8-22 mins G-Codes: OT G-codes **NOT FOR INPATIENT CLASS** Functional Assessment Tool Used: clinical judgment Functional Limitation: Self care Self Care Current Status (Y2233): At least 1 percent but less than 20 percent impaired, limited or restricted Self Care Goal Status (K1224): 0 percent  impaired, limited or restricted  Benito Mccreedy OTR/L [phone] 05/29/2014, 9:47 AM

## 2014-05-29 NOTE — Progress Notes (Signed)
Received request for inpatient rehab prescreen and have reviewed pt's case. Noted that pt is in observation status. At this time, pt does not have the medical necessity to justify an inpatient rehab stay in light of being in observation. If pt's medical needs change and she becomes inpatient status, we can review pt's case at that time.  Thank you.  Nanetta Batty, PT Rehabilitation Admissions Coordinator [phone]

## 2014-05-30 DIAGNOSIS — Z5189 Encounter for other specified aftercare: Secondary | ICD-10-CM

## 2014-05-30 MED ORDER — DIPHENHYDRAMINE HCL 25 MG PO CAPS
25.0000 mg | ORAL_CAPSULE | ORAL | Status: DC | PRN
  Administered 2014-05-30: 25 mg via ORAL
  Filled 2014-05-30: qty 1

## 2014-05-30 MED ORDER — IBUPROFEN 200 MG PO TABS: 600.0000 mg | ORAL_TABLET | ORAL | Status: DC | PRN

## 2014-05-30 NOTE — Discharge Instructions (Signed)
Head Injury You have a head injury. Headaches and throwing up (vomiting) are common after a head injury. It should be easy to wake up from sleeping. Sometimes you must stay in the hospital. Most problems happen within the first 24 hours. Side effects may occur up to 7-10 days after the injury.  WHAT ARE THE TYPES OF HEAD INJURIES? Head injuries can be as minor as a bump. Some head injuries can be more severe. More severe head injuries include:  A jarring injury to the brain (concussion).  A bruise of the brain (contusion). This mean there is bleeding in the brain that can cause swelling.  A cracked skull (skull fracture).  Bleeding in the brain that collects, clots, and forms a bump (hematoma). WHEN SHOULD I GET HELP RIGHT AWAY?   You are confused or sleepy.  You cannot be woken up.  You feel sick to your stomach (nauseous) or keep throwing up (vomiting).  Your dizziness or unsteadiness is getting worse.  You have very bad, lasting headaches that are not helped by medicine. Take medicines only as told by your doctor.  You cannot use your arms or legs like normal.  You cannot walk.  You notice changes in the black spots in the center of the colored part of your eye (pupil).  You have clear or bloody fluid coming from your nose or ears.  You have trouble seeing. During the next 24 hours after the injury, you must stay with someone who can watch you. This person should get help right away (call 911 in the [place] if you start to shake and are not able to control it (have seizures), you pass out, or you are unable to wake up. HOW CAN I PREVENT A HEAD INJURY IN THE FUTURE?  Wear seat belts.  Wear a helmet while bike riding and playing sports like football.  Stay away from dangerous activities around the house. WHEN CAN I RETURN TO NORMAL ACTIVITIES AND ATHLETICS? See your doctor before doing these activities. You should not do normal activities or play contact sports until 1 week  after the following symptoms have stopped:  Headache that does not go away.  Dizziness.  Poor attention.  Confusion.  Memory problems.  Sickness to your stomach or throwing up.  Tiredness.  Fussiness.  Bothered by bright lights or loud noises.  Anxiousness or depression.  Restless sleep. MAKE SURE YOU:   Understand these instructions.  Will watch your condition.  Will get help right away if you are not doing well or get worse. Document Released: 09/11/2008 Document Revised: 02/13/2014 Document Reviewed: 06/06/2013 [facility] Patient Information 2015 [name], [place]. This information is not intended to replace advice given to you by your health care provider. Make sure you discuss any questions you have with your health care provider.  Facial or Scalp Contusion A facial or scalp contusion is a deep bruise on the face or head. Injuries to the face and head generally cause a lot of swelling, especially around the eyes. Contusions are the result of an injury that caused bleeding under the skin. The contusion may turn blue, purple, or yellow. Minor injuries will give you a painless contusion, but more severe contusions may stay painful and swollen for a few weeks.  CAUSES  A facial or scalp contusion is caused by a blunt injury or trauma to the face or head area.  SIGNS AND SYMPTOMS   Swelling of the injured area.   Discoloration of the injured area.  Tenderness, soreness, or pain in the injured area.  DIAGNOSIS  The diagnosis can be made by taking a medical history and doing a physical exam. An X-ray exam, CT scan, or MRI may be needed to determine if there are any associated injuries, such as broken bones (fractures). TREATMENT  Often, the best treatment for a facial or scalp contusion is applying cold compresses to the injured area. Over-the-counter medicines may also be recommended for pain control.  HOME CARE INSTRUCTIONS   Only take over-the-counter or  prescription medicines as directed by your health care provider.   Apply ice to the injured area.   Put ice in a plastic bag.   Place a towel between your skin and the bag.   Leave the ice on for 20 minutes, 2-3 times a day.  SEEK MEDICAL CARE IF:  You have bite problems.   You have pain with chewing.   You are concerned about facial defects. SEEK IMMEDIATE MEDICAL CARE IF:  You have severe pain or a headache that is not relieved by medicine.   You have unusual sleepiness, confusion, or personality changes.   You throw up (vomit).   You have a persistent nosebleed.   You have double vision or blurred vision.   You have fluid drainage from your nose or ear.   You have difficulty walking or using your arms or legs.  MAKE SURE YOU:   Understand these instructions.  Will watch your condition.  Will get help right away if you are not doing well or get worse. Document Released: 11/06/2004 Document Revised: 07/20/2013 Document Reviewed: 05/12/2013 [facility] Patient Information 2015 [place], [place]. This information is not intended to replace advice given to you by your health care provider. Make sure you discuss any questions you have with your health care provider.  Syncope Syncope means a person passes out (faints). The person usually wakes up in less than 5 minutes. It is important to seek medical care for syncope. HOME CARE  Have someone stay with you until you feel normal.  Do not drive, use machines, or play sports until your doctor says it is okay.  Keep all doctor visits as told.  Lie down when you feel like you might pass out. Take deep breaths. Wait until you feel normal before standing up.  Drink enough fluids to keep your pee (urine) clear or pale yellow.  If you take blood pressure or heart medicine, get up slowly. Take several minutes to sit and then stand. GET HELP RIGHT AWAY IF:   You have a severe headache.  You have pain in the  chest, belly (abdomen), or back.  You are bleeding from the mouth or butt (rectum).  You have black or tarry poop (stool).  You have an irregular or very fast heartbeat.  You have pain with breathing.  You keep passing out, or you have shaking (seizures) when you pass out.  You pass out when sitting or lying down.  You feel confused.  You have trouble walking.  You have severe weakness.  You have vision problems. If you fainted, call your local emergency services (911 in [place] Do not drive yourself to the hospital. MAKE SURE YOU:   Understand these instructions.  Will watch your condition.  Will get help right away if you are not doing well or get worse. Document Released: 03/17/2008 Document Revised: 03/30/2012 Document Reviewed: 11/28/2011 [facility] Patient Information 2015 [place], [place]. This information is not intended to replace advice given to you by  your health care provider. Make sure you discuss any questions you have with your health care provider. ° °

## 2014-05-30 NOTE — Discharge Summary (Signed)
Physician Discharge Summary  CHARNELE SEMPLE BJY:[id number] DOB: October 20, 1945 DOA: 05/28/2014  PCP: No primary provider on file.  Admit date: 05/28/2014 Discharge date: 05/30/2014  Time spent: >45 minutes  Recommendations for Outpatient Follow-up:  1. Need 24 hr EEG as outpt by Neurology  Discharge Diagnoses:  Principal Problem:   Left-sided weakness Active Problems:   Syncope and collapse   Hypertension   Scalp contusion   Discharge Condition: stable  Diet recommendation: heart healthy  Filed Weights   05/28/14 2030 05/30/14 0400  Weight: 85.412 kg (188 lb 4.8 oz) 85.5 kg (188 lb 7.9 oz)    History of present illness:  [name] is a 68 y.o. female with HTN, IBS and malignant melanoma who lost consciouness yesterday. She last remembers walking down the hall in her home. She then recalled knowing that she had fallen and feeling unwell. she walked to the bedroom and called her husband. She was noted to have a large scalp contusion and she cannot remember how it happened. Her husband states she appeared confused. She was noted to have left sided weakness after regaining consciousness and was brought in to the ER and admitted. 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  - followed on tele for arrhthymias - ECHO reveals normal LV function, no abnormalities in valves or wall motion abnormalities.  Left ventricle: The cavity size was normal. Wall thickness was normal. Systolic function was normal. The estimated  ejection fraction was in the range of 55% to 60%. Wall motion was normal; there were no regional wall motion abnormalities. Left ventricular diastolic function parameters were normal. - Tricuspid valve: There was mild regurgitation. - Pulmonary arteries: PA peak pressure: 32 mm Hg (S). - Inferior vena cava: The vessel was normal in size. The respirophasic diameter changes were in the normal range (>= 50%), consistent with normal central venous pressure.   HTN  - cont home meds for now   Hypokalemia  - replace and follow   Vomiting  - began yesterday morning after I evaluated her (called by RN)  -resolved with antiemetics  Scalp contusion  - monitor  - per husband, it has greatly reduced in size from admission - PRN Ibuprofen for pain and swelling- advised to try Pepcid if Ibuprofen causes heartburn/indigestion   Procedures: EEG- Impression: This awake and asleep EEG is abnormal due to the presence of: 1. Occasional focal slowing over the right temporal region 2. Excess low voltage diffuse beta activity    Consultations:  Neurology   Discharge Exam: Filed Vitals:   05/30/14 1212  BP: 120/63  Pulse: 53  Temp: 97.6 F (36.4 C)  Resp: 18    General:AAO x 3, no distress, Cardiovascular: RRR, no murmurs Respiratory: CTA b/l   Discharge Instructions You were cared for by a hospitalist during your hospital stay. If you have any questions about your discharge medications or the care you received while you were in the hospital after you are discharged, you can call the  unit and asked to speak with the hospitalist on call if the hospitalist that took care of you is not available. Once you are discharged, your primary care physician will handle any further medical issues. Please note that NO REFILLS for any discharge medications will be authorized once you are discharged, as it is imperative that you return to your primary care physician (or establish a relationship with a  primary care physician if you do not have one) for your aftercare needs so that they can reassess your need for medications and monitor your lab values.      Discharge Instructions   Ambulatory referral to Physical Therapy    Complete by:  As directed      Diet - low sodium heart healthy    Complete by:  As directed      Increase activity slowly    Complete by:  As directed             Medication List         ALAWAY OP  Place 5 drops into both eyes daily as needed (for allergies).     ALPRAZolam 1 MG tablet  Commonly known as:  XANAX  Take 2 mg by mouth at bedtime.     aspirin EC 81 MG tablet  Take 81 mg by mouth at bedtime.     hydrochlorothiazide 25 MG tablet  Commonly known as:  HYDRODIURIL  Take 25 mg by mouth daily.     ibuprofen 200 MG tablet  Commonly known as:  ADVIL,MOTRIN  Take 3 tablets (600 mg total) by mouth every 4 (four) hours as needed for fever, headache or mild pain.     metoprolol tartrate 25 MG tablet  Commonly known as:  LOPRESSOR  Take 25 mg by mouth 2 (two) times daily.     sertraline 50 MG tablet  Commonly known as:  ZOLOFT  Take 50 mg by mouth daily.     simvastatin 40 MG tablet  Commonly known as:  ZOCOR  Take 40 mg by mouth at bedtime.     TYLENOL PO  Take 2 tablets by mouth daily as needed (for pain or headache).       Allergies  Allergen Reactions  . Codeine Nausea And Vomiting  . Penicillins Swelling  . Flagyl [Metronidazole] Hives  . Sulfa Antibiotics Nausea And Vomiting   Follow-up Information   Follow up with Marcial Pacas, MD. Schedule an appointment as soon as possible for a visit on 06/02/2014. (@8  am  for possible need of a continuous EEG)    Specialty:  Neurology   Contact information:   [address] [place] [address] [phone]        The results of significant diagnostics from this hospitalization (including imaging, microbiology, ancillary and laboratory) are listed below for reference.     Significant Diagnostic Studies: Ct Head Wo Contrast  05/28/2014   CLINICAL DATA:  Syncope knee, fall.  EXAM: CT HEAD WITHOUT CONTRAST  CT CERVICAL SPINE WITHOUT CONTRAST  TECHNIQUE: Multidetector CT imaging of the head and cervical spine was performed following the standard protocol without intravenous contrast. Multiplanar CT image reconstructions of the cervical spine were also generated.  COMPARISON:  None.  FINDINGS: CT HEAD FINDINGS  The patient has a large scalp contusion posteriorly. No underlying fracture is identified. The brain appears normal without evidence of infarct, hemorrhage, mass lesion, mass effect, midline shift or abnormal extra-axial fluid collection there is no hydrocephalus or pneumocephalus. Imaged paranasal sinuses  and mastoid air cells are clear.  CT CERVICAL SPINE FINDINGS  Straightening of the normal cervical lordosis is identified. Vertebral body height and alignment are maintained. There is loss of disc space height and endplate spurring at H4-1 and C6-7. Scattered facet degenerative disease is also identified. Coarse calcification left lobe of the thyroid is incidentally noted. Lung apices are clear.  IMPRESSION: Large scalp contusion without underlying fracture or acute intracranial abnormality.  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   Electronically Signed   By: Inge Rise M.D.   On: 05/28/2014 18:33   Ct Cervical Spine Wo Contrast  05/28/2014   CLINICAL DATA:  Syncope knee, fall.  EXAM: CT HEAD WITHOUT CONTRAST  CT CERVICAL SPINE WITHOUT CONTRAST  TECHNIQUE: Multidetector CT imaging of the head and cervical spine was performed following the standard protocol without intravenous contrast. Multiplanar CT image reconstructions of the cervical spine were also generated.  COMPARISON:  None.  FINDINGS: CT HEAD FINDINGS  The patient has a large scalp contusion posteriorly. No underlying fracture is identified. The brain  appears normal without evidence of infarct, hemorrhage, mass lesion, mass effect, midline shift or abnormal extra-axial fluid collection there is no hydrocephalus or pneumocephalus. Imaged paranasal sinuses and mastoid air cells are clear.  CT CERVICAL SPINE FINDINGS  Straightening of the normal cervical lordosis is identified. Vertebral body height and alignment are maintained. There is loss of disc space height and endplate spurring at D4-0 and C6-7. Scattered facet degenerative disease is also identified. Coarse calcification left lobe of the thyroid is incidentally noted. Lung apices are clear.  IMPRESSION: Large scalp contusion without underlying fracture or acute intracranial abnormality.  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   Electronically Signed   By: Inge Rise M.D.   On: 05/28/2014 18:33   Mary Brain Wo Contrast  05/29/2014   CLINICAL DATA:  68 year old female with fall yesterday and found to have left side weakness. Initial encounter. History of melanoma.  EXAM: MRI HEAD WITHOUT CONTRAST  MRA HEAD WITHOUT CONTRAST  TECHNIQUE: Multiplanar, multiecho pulse sequences of the brain and surrounding structures were obtained without intravenous contrast. Angiographic images of the head were obtained using MRA technique without contrast.  COMPARISON:  Head and cervical spine CT 05/28/2014. Brain MRI without and with contrast 05/28/2007.  FINDINGS: MRI HEAD FINDINGS  Major intracranial vascular flow voids are stable. Stable and normal for age cerebral volume. No restricted diffusion to suggest acute infarction. No midline shift, mass effect, evidence of mass lesion, ventriculomegaly, extra-axial collection or acute intracranial hemorrhage. Cervicomedullary junction within normal limits. Stable and negative visualized cervical spine.  Mild for age scattered cerebral white matter T2 and FLAIR hyperintensity was better demonstrated in 2008. Stable probable  perivascular spaces in the right thalamus. Stable partially empty sella.  Large and broad-based left posterior heterogeneous scalp hematoma.  Visualized paranasal sinuses and mastoids are clear. Visualized orbit soft tissues are within normal limits. Visualized bone marrow signal is within normal limits.  MRA HEAD FINDINGS  Antegrade flow in the posterior circulation with codominant distal vertebral arteries. Fenestrated proximal basilar artery (normal anatomic variation). Normal PICA origins. No basilar artery stenosis. SCA and left PCA origins are within normal limits. Fetal type right PCA origin. Bilateral PCA branches are within normal limits. Left posterior communicating artery is diminutive or absent.  Antegrade flow in both ICA siphons. No siphon stenosis. Ophthalmic and right posterior communicating  artery origins are within normal limits. Patent carotid termini. Dominant left ACA A1 segment, diminutive right A2 segment. Anterior communicating artery within normal limits. Visualized bilateral ACA branches are within normal limits. Normal MCA origins. Normal visualized bilateral MCA branches.  IMPRESSION: 1. Stable since 2008 and largely unremarkable for age non contrast MRI appearance of the brain. 2. Large and broad-based left scalp hematoma. 3. Negative intracranial MRA; normal variation of the ACA, right PCA, and proximal basilar artery anatomy.   Electronically Signed   By: Lars Pinks M.D.   On: 05/29/2014 10:37   [name] Head/brain Wo Cm  05/29/2014   CLINICAL DATA:  68 year old female with fall yesterday and found to have left side weakness. Initial encounter. History of melanoma.  EXAM: MRI HEAD WITHOUT CONTRAST  MRA HEAD WITHOUT CONTRAST  TECHNIQUE: Multiplanar, multiecho pulse sequences of the brain and surrounding structures were obtained without intravenous contrast. Angiographic images of the head were obtained using MRA technique without contrast.  COMPARISON:  Head and cervical spine CT  05/28/2014. Brain MRI without and with contrast 05/28/2007.  FINDINGS: MRI HEAD FINDINGS  Major intracranial vascular flow voids are stable. Stable and normal for age cerebral volume. No restricted diffusion to suggest acute infarction. No midline shift, mass effect, evidence of mass lesion, ventriculomegaly, extra-axial collection or acute intracranial hemorrhage. Cervicomedullary junction within normal limits. Stable and negative visualized cervical spine.  Mild for age scattered cerebral white matter T2 and FLAIR hyperintensity was better demonstrated in 2008. Stable probable perivascular spaces in the right thalamus. Stable partially empty sella.  Large and broad-based left posterior heterogeneous scalp hematoma.  Visualized paranasal sinuses and mastoids are clear. Visualized orbit soft tissues are within normal limits. Visualized bone marrow signal is within normal limits.  MRA HEAD FINDINGS  Antegrade flow in the posterior circulation with codominant distal vertebral arteries. Fenestrated proximal basilar artery (normal anatomic variation). Normal PICA origins. No basilar artery stenosis. SCA and left PCA origins are within normal limits. Fetal type right PCA origin. Bilateral PCA branches are within normal limits. Left posterior communicating artery is diminutive or absent.  Antegrade flow in both ICA siphons. No siphon stenosis. Ophthalmic and right posterior communicating artery origins are within normal limits. Patent carotid termini. Dominant left ACA A1 segment, diminutive right A2 segment. Anterior communicating artery within normal limits. Visualized bilateral ACA branches are within normal limits. Normal MCA origins. Normal visualized bilateral MCA branches.  IMPRESSION: 1. Stable since 2008 and largely unremarkable for age non contrast MRI appearance of the brain. 2. Large and broad-based left scalp hematoma. 3. Negative intracranial MRA; normal variation of the ACA, right PCA, and proximal basilar  artery anatomy.   Electronically Signed   By: Lars Pinks M.D.   On: 05/29/2014 10:37    Microbiology: No results found for this or any previous visit (from the past 240 hour(s)).   Labs: Basic Metabolic Panel:  Recent Labs Lab 05/28/14 1706 05/28/14 1736  NA 141 141  K 3.5* 3.3*  CL 103 103  CO2 29  --   GLUCOSE 132* 136*  BUN 14 14  CREATININE 0.79 0.80  CALCIUM 8.9  --    Liver Function Tests:  Recent Labs Lab 05/28/14 1706  AST 32  ALT 35  ALKPHOS 74  BILITOT 0.4  PROT 6.3  ALBUMIN 3.5   No results found for this basename: LIPASE, AMYLASE,  in the last 168 hours No results found for this basename: AMMONIA,  in the last 168 hours CBC:  Recent Labs Lab 05/28/14 1706 05/28/14 1736  WBC 8.7  --   NEUTROABS 5.6  --   HGB 14.1 14.3  HCT 40.8 42.0  MCV 91.7  --   PLT 203  --    Cardiac Enzymes: No results found for this basename: CKTOTAL, CKMB, CKMBINDEX, TROPONINI,  in the last 168 hours BNP: BNP (last 3 results) No results found for this basename: PROBNP,  in the last 8760 hours CBG: No results found for this basename: GLUCAP,  in the last 168 hours     Signed:  Debbe Odea, MD [facility] 05/30/2014, 2:28 PM

## 2014-05-30 NOTE — Progress Notes (Signed)
Occupational Therapy Treatment Patient Details Name: [name] MRN: [id number] DOB: 1946/04/20 Today's Date: 05/30/2014    History of present illness CHERA SLIVKA is a 68 y.o. female with a history of HTN, IBS, and Malignant Melanoma who was found by her husband when he returned home from church.  She does not remember what happened, but she apparently had fallen and hit the back of her head. She does not know whether she slipped and fell or passed out.   She reports having left sided weakness following the episode . MRI negative   OT comments  Pt still having difficulty with convergence of eyes. Education provided to pt/spouse. Theraband given for exercises and reviewed exercise for vision. Recommended no driving.  Follow Up Recommendations  Outpatient OT;Supervision/Assistance - 24 hour    Equipment Recommendations  None recommended by OT    Recommendations for Other Services      Precautions / Restrictions Precautions Precautions: Fall Restrictions Weight Bearing Restrictions: No       Mobility Bed Mobility              General bed mobility comments: not assessed  Transfers Overall transfer level: Needs assistance   Transfers: Sit to/from Stand Sit to Stand: Min guard       General transfer comment: cues for toilet transfer technique.        ADL Overall ADL's : Needs assistance/impaired                         Toilet Transfer: Supervision/safety;Ambulation;Regular Toilet       Tub/ Banker: Min guard;Ambulation   Functional mobility during ADLs: Min guard;Supervision/safety General ADL Comments: Educated on safety tips for home (sitting for LB ADLs, pets). Discussed safety with stepping in shower and practiced stepping over small threshold. Educated on theraband exercises and pt performed. Performed fusion exercises to try to help with convergence/double vision.      Vision                     Perception      Praxis      Cognition   Behavior During Therapy: [facility] for tasks assessed/performed Overall Cognitive Status: Within Functional Limits for tasks assessed                       Extremity/Trunk Assessment               Exercises  vision fusion exercise; approximately 10 reps each of left shoulder flexion, elbow flexion/extension with level 2 theraband.   Shoulder Instructions       General Comments      Pertinent Vitals/ Pain       Pain Assessment: 0-10 Pain Score: 6  Pain Location: headache Pain Descriptors / Indicators: Throbbing Pain Intervention(s): Other (comment) (notified nurse)  Home Living                                          Prior Functioning/Environment              Frequency Min 3X/week     Progress Toward Goals  OT Goals(current goals can now be found in the care plan section)  Progress towards OT goals: Progressing toward goals  Acute Rehab OT Goals Patient Stated Goal: not stated OT Goal Formulation: With patient Time For  Goal Achievement: 06/05/14 Potential to Achieve Goals: Good ADL Goals Additional ADL Goal #2: Pt will independently perform HEP for LUE to increase strength.  Plan Discharge plan remains appropriate    Co-evaluation                 End of Session Equipment Utilized During Treatment: Gait belt   Activity Tolerance Patient tolerated treatment well   Patient Left in chair;with call bell/phone within reach;with family/visitor present   Nurse Communication Other (comment) (headache; pain meds)        Time: 7628-3151 OT Time Calculation (min): 19 min  Charges: OT General Charges $OT Visit: 1 Procedure OT Treatments $Therapeutic Activity: 8-22 mins   Benito Mccreedy OTR/L [phone] 05/30/2014, 11:58 AM

## 2014-05-30 NOTE — Progress Notes (Signed)
NEURO HOSPITALIST PROGRESS NOTE   SUBJECTIVE:                                                                                                                        Complaining of HA and having short term memory loss. MRI brain showed no acute intracranial abnormality. EEG " occasional focal slowing over the right temporal region" but no epileptiform discharges noted.  OBJECTIVE:                                                                                                                           Vital signs in last 24 hours: Temp:  [97.6 F (36.4 C)-98.6 F (37 C)] 97.6 F (36.4 C) (08/18 1212) Pulse Rate:  [53-68] 53 (08/18 1212) Resp:  [18-22] 18 (08/18 1212) BP: (105-145)/(46-64) 120/63 mmHg (08/18 1212) SpO2:  [92 %-97 %] 96 % (08/18 1212) Weight:  [85.5 kg (188 lb 7.9 oz)] 85.5 kg (188 lb 7.9 oz) (08/18 0400)  Intake/Output from previous day: 08/17 0701 - 08/18 0700 In: 750 [P.O.:750] Out: -  Intake/Output this shift: Total I/O In: 240 [P.O.:240] Out: -  Nutritional status: Cardiac  Past Medical History  Diagnosis Date  . Malignant melanoma   . IBS (irritable bowel syndrome)   . Hypertension     Neurologic Exam:  Mental Status:  Alert, oriented, thought content appropriate. Speech fluent without evidence of aphasia. Able to follow 3 step commands without difficulty.  Cranial Nerves:  II: Discs flat bilaterally; Visual fields grossly normal, pupils equal, round, reactive to light and accommodation  III,IV, VI: ptosis not present, extra-ocular motions intact bilaterally  V,VII: smile symmetric, facial light touch sensation normal bilaterally  VIII: hearing normal bilaterally  IX,X: gag reflex present  XI: bilateral shoulder shrug  XII: midline tongue extension without atrophy or fasciculations  Motor:  Significant for subtle left sided weakness.  Tone and bulk:normal tone throughout; no atrophy noted  Sensory: Pinprick  and light touch intact throughout, bilaterally  Deep Tendon Reflexes:  1 all over  Plantars:  Right: downgoing Left: downgoing  Cerebellar:  normal finger-to-nose, normal heel-to-shin test  Gait:   Lab Results: Lab Results  Component Value Date/Time   CHOL 139 05/29/2014  4:24 AM  Lipid Panel  Recent Labs  05/29/14 0424  CHOL 139  TRIG 193*  HDL 34*  CHOLHDL 4.1  VLDL 39  LDLCALC 66    Studies/Results: Ct Head Wo Contrast  05/28/2014   CLINICAL DATA:  Syncope knee, fall.  EXAM: CT HEAD WITHOUT CONTRAST  CT CERVICAL SPINE WITHOUT CONTRAST  TECHNIQUE: Multidetector CT imaging of the head and cervical spine was performed following the standard protocol without intravenous contrast. Multiplanar CT image reconstructions of the cervical spine were also generated.  COMPARISON:  None.  FINDINGS: CT HEAD FINDINGS  The patient has a large scalp contusion posteriorly. No underlying fracture is identified. The brain appears normal without evidence of infarct, hemorrhage, mass lesion, mass effect, midline shift or abnormal extra-axial fluid collection there is no hydrocephalus or pneumocephalus. Imaged paranasal sinuses and mastoid air cells are clear.  CT CERVICAL SPINE FINDINGS  Straightening of the normal cervical lordosis is identified. Vertebral body height and alignment are maintained. There is loss of disc space height and endplate spurring at B0-1 and C6-7. Scattered facet degenerative disease is also identified. Coarse calcification left lobe of the thyroid is incidentally noted. Lung apices are clear.  IMPRESSION: Large scalp contusion without underlying fracture or acute intracranial abnormality.  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   Electronically Signed   By: Inge Rise M.D.   On: 05/28/2014 18:33   Ct Cervical Spine Wo Contrast  05/28/2014   CLINICAL DATA:  Syncope knee, fall.  EXAM: CT HEAD WITHOUT CONTRAST  CT CERVICAL  SPINE WITHOUT CONTRAST  TECHNIQUE: Multidetector CT imaging of the head and cervical spine was performed following the standard protocol without intravenous contrast. Multiplanar CT image reconstructions of the cervical spine were also generated.  COMPARISON:  None.  FINDINGS: CT HEAD FINDINGS  The patient has a large scalp contusion posteriorly. No underlying fracture is identified. The brain appears normal without evidence of infarct, hemorrhage, mass lesion, mass effect, midline shift or abnormal extra-axial fluid collection there is no hydrocephalus or pneumocephalus. Imaged paranasal sinuses and mastoid air cells are clear.  CT CERVICAL SPINE FINDINGS  Straightening of the normal cervical lordosis is identified. Vertebral body height and alignment are maintained. There is loss of disc space height and endplate spurring at B5-1 and C6-7. Scattered facet degenerative disease is also identified. Coarse calcification left lobe of the thyroid is incidentally noted. Lung apices are clear.  IMPRESSION: Large scalp contusion without underlying fracture or acute intracranial abnormality.  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   Electronically Signed   By: Inge Rise M.D.   On: 05/28/2014 18:33   Mr Brain Wo Contrast  05/29/2014   CLINICAL DATA:  68 year old female with fall yesterday and found to have left side weakness. Initial encounter. History of melanoma.  EXAM: MRI HEAD WITHOUT CONTRAST  MRA HEAD WITHOUT CONTRAST  TECHNIQUE: Multiplanar, multiecho pulse sequences of the brain and surrounding structures were obtained without intravenous contrast. Angiographic images of the head were obtained using MRA technique without contrast.  COMPARISON:  Head and cervical spine CT 05/28/2014. Brain MRI without and with contrast 05/28/2007.  FINDINGS: MRI HEAD FINDINGS  Major intracranial vascular flow voids are stable. Stable and normal for age cerebral volume. No  restricted diffusion to suggest acute infarction. No midline shift, mass effect, evidence of mass lesion, ventriculomegaly, extra-axial collection or acute intracranial hemorrhage. Cervicomedullary junction within normal limits. Stable and negative  visualized cervical spine.  Mild for age scattered cerebral white matter T2 and FLAIR hyperintensity was better demonstrated in 2008. Stable probable perivascular spaces in the right thalamus. Stable partially empty sella.  Large and broad-based left posterior heterogeneous scalp hematoma.  Visualized paranasal sinuses and mastoids are clear. Visualized orbit soft tissues are within normal limits. Visualized bone marrow signal is within normal limits.  MRA HEAD FINDINGS  Antegrade flow in the posterior circulation with codominant distal vertebral arteries. Fenestrated proximal basilar artery (normal anatomic variation). Normal PICA origins. No basilar artery stenosis. SCA and left PCA origins are within normal limits. Fetal type right PCA origin. Bilateral PCA branches are within normal limits. Left posterior communicating artery is diminutive or absent.  Antegrade flow in both ICA siphons. No siphon stenosis. Ophthalmic and right posterior communicating artery origins are within normal limits. Patent carotid termini. Dominant left ACA A1 segment, diminutive right A2 segment. Anterior communicating artery within normal limits. Visualized bilateral ACA branches are within normal limits. Normal MCA origins. Normal visualized bilateral MCA branches.  IMPRESSION: 1. Stable since 2008 and largely unremarkable for age non contrast MRI appearance of the brain. 2. Large and broad-based left scalp hematoma. 3. Negative intracranial MRA; normal variation of the ACA, right PCA, and proximal basilar artery anatomy.   Electronically Signed   By: Lars Pinks M.D.   On: 05/29/2014 10:37   Mr Jodene Nam Head/brain Wo Cm  05/29/2014   CLINICAL DATA:  68 year old female with fall yesterday and  found to have left side weakness. Initial encounter. History of melanoma.  EXAM: MRI HEAD WITHOUT CONTRAST  MRA HEAD WITHOUT CONTRAST  TECHNIQUE: Multiplanar, multiecho pulse sequences of the brain and surrounding structures were obtained without intravenous contrast. Angiographic images of the head were obtained using MRA technique without contrast.  COMPARISON:  Head and cervical spine CT 05/28/2014. Brain MRI without and with contrast 05/28/2007.  FINDINGS: MRI HEAD FINDINGS  Major intracranial vascular flow voids are stable. Stable and normal for age cerebral volume. No restricted diffusion to suggest acute infarction. No midline shift, mass effect, evidence of mass lesion, ventriculomegaly, extra-axial collection or acute intracranial hemorrhage. Cervicomedullary junction within normal limits. Stable and negative visualized cervical spine.  Mild for age scattered cerebral white matter T2 and FLAIR hyperintensity was better demonstrated in 2008. Stable probable perivascular spaces in the right thalamus. Stable partially empty sella.  Large and broad-based left posterior heterogeneous scalp hematoma.  Visualized paranasal sinuses and mastoids are clear. Visualized orbit soft tissues are within normal limits. Visualized bone marrow signal is within normal limits.  MRA HEAD FINDINGS  Antegrade flow in the posterior circulation with codominant distal vertebral arteries. Fenestrated proximal basilar artery (normal anatomic variation). Normal PICA origins. No basilar artery stenosis. SCA and left PCA origins are within normal limits. Fetal type right PCA origin. Bilateral PCA branches are within normal limits. Left posterior communicating artery is diminutive or absent.  Antegrade flow in both ICA siphons. No siphon stenosis. Ophthalmic and right posterior communicating artery origins are within normal limits. Patent carotid termini. Dominant left ACA A1 segment, diminutive right A2 segment. Anterior communicating  artery within normal limits. Visualized bilateral ACA branches are within normal limits. Normal MCA origins. Normal visualized bilateral MCA branches.  IMPRESSION: 1. Stable since 2008 and largely unremarkable for age non contrast MRI appearance of the brain. 2. Large and broad-based left scalp hematoma. 3. Negative intracranial MRA; normal variation of the ACA, right PCA, and proximal basilar artery anatomy.   Electronically Signed  By: Lars Pinks M.D.   On: 05/29/2014 10:37    MEDICATIONS                                                                                                                        Scheduled: .  stroke: mapping our early stages of recovery book   Does not apply Once  . ALPRAZolam  2 mg Oral QHS  . aspirin  300 mg Rectal Daily   Or  . aspirin  325 mg Oral Daily  . enoxaparin (LOVENOX) injection  40 mg Subcutaneous Q24H  . hydrochlorothiazide  25 mg Oral Daily  . metoprolol tartrate  25 mg Oral BID  . sertraline  50 mg Oral Daily  . simvastatin  40 mg Oral QHS    ASSESSMENT/PLAN:                                                                                                            68 y.o. female with subtle left sided weakness after sustaining a fall with LOC for ? period of time. Unclear cause of fall. MRI brain negative for acute abnormality. EEG with occasional non specific intermittent right temporal slowing. 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 I spent time with patient informing her that PCS is usually a short lasting disorder although in a minority of patient could last weeks to months. EEG findings are non specific. We certainly don't know why she fell (patient's husband thinks that she slipped on water on the floor), so unclear if she requires an ambulatory 24 EEG for better definition of previous EEG findings. Will sign off.     Dorian Pod, MD [facility] [phone]  05/30/2014, 12:21 PM

## 2014-05-30 NOTE — Progress Notes (Signed)
Reviewed discharge instructions with patient's husband and he stated his understanding.  Outpatient followup appointment with neurology Dr. Ronny Flurry was arranged and placed on discharge instructions.  Instructed patient's husband patient needed to followup with primary care physician in next week or 2, he wanted to arrange appointment.  Outpatient therapy arranged by CM.  Discharged home via wheelchair.  Sanda Linger

## 2014-05-30 NOTE — Progress Notes (Addendum)
Physical Therapy Treatment Patient Details Name: [name] MRN: [id number] DOB: [birth year] Today's Date: 05/30/2014    History of Present Illness [name] is a 68 y.o. female with a history of HTN, IBS, and Malignant Melanoma who was found by her husband when he returned home from church.  She does not remember what happened, but she apparently had fallen and hit the back of her head. She does not know whether she slipped and fell or passed out.   She reports having left sided weakness following the episode . MRI negative    PT Comments    Pt progressing with mobility with ability to perform all visual tracking today and no report of blurry or double vision. Pt with improved gait but continues to demonstrate decreased balance and 4/5 strength LUE and LLE today compared to 5/5 on right side. Recommend use of RW at home and OPPT as well as shower seat as pt significantly losing balance with eyes closed or single limb stance.   Follow Up Recommendations  Outpatient PT     Equipment Recommendations       Recommendations for Other Services       Precautions / Restrictions Precautions Precautions: Fall    Mobility  Bed Mobility Overal bed mobility: Modified Independent             General bed mobility comments: bed flat, no rail  Transfers Overall transfer level: Modified independent   Transfers: Stand Pivot Transfers;Sit to/from Stand Sit to Stand: Modified independent (Device/Increase time) Stand pivot transfers: Modified independent (Device/Increase time)       General transfer comment: required use of hands to complete  Ambulation/Gait Ambulation/Gait assistance: Supervision Ambulation Distance (Feet): 400 Feet Assistive device: None Gait Pattern/deviations: Step-through pattern;Decreased stride length Gait velocity: 20'/11sec=1.8 ft/sec Gait velocity interpretation: Below normal speed for age/gender General Gait Details: slow gait but faster than on  eval. pt able to perform head turns and change in speed and direction without significant change to gait but remains cautious   Stairs Stairs: Yes Stairs assistance: Modified independent (Device/Increase time) Stair Management: One rail Right;Step to pattern;Forwards Number of Stairs: 2    Wheelchair Mobility    Modified Rankin (Stroke Patients Only)       Balance     Sitting balance-Leahy Scale: Good       Standing balance-Leahy Scale: Fair                      Cognition Arousal/Alertness: Awake/alert Behavior During Therapy: WFL for tasks assessed/performed Overall Cognitive Status: Within Functional Limits for tasks assessed                      Exercises      General Comments        Pertinent Vitals/Pain Pain Score: 3  Pain Location: head Pain Descriptors / Indicators: Aching Pain Intervention(s): Premedicated before session    Home Living                      Prior Function            PT Goals (current goals can now be found in the care plan section) Progress towards PT goals: Progressing toward goals    Frequency  Min 3X/week    PT Plan Discharge plan needs to be updated;Frequency needs to be updated    Co-evaluation             End  of Session Equipment Utilized During Treatment: Gait belt Activity Tolerance: Patient tolerated treatment well Patient left: in chair;with call bell/phone within reach;with chair alarm set;with family/visitor present     Time: 8502-7741 PT Time Calculation (min): 26 min  Charges:  $Gait Training: 8-22 mins $Physical Performance Test: 8-22 mins                    G Codes:      Melford Aase 2014-06-02, 8:24 AM Elwyn Reach, [place]

## 2014-05-30 NOTE — Evaluation (Signed)
Speech Language Pathology Evaluation Patient Details Name: [name] MRN: [id number] DOB: Sep 12, 1946 Today's Date: 05/30/2014 Time: 1132-1202 SLP Time Calculation (min): 30 min  Problem List:  Patient Active Problem List   Diagnosis Date Noted  . Left-sided weakness 05/28/2014  . Syncope and collapse 05/28/2014  . Scalp contusion 05/28/2014  . Hypertension    Past Medical History:  Past Medical History  Diagnosis Date  . Malignant melanoma   . IBS (irritable bowel syndrome)   . Hypertension    Past Surgical History: History reviewed. No pertinent past surgical history. HPI:  [name] is a 68 y.o. female with a history of HTN, IBS, and Malignant Melanoma who was found by her husband when he returned home from church. She does not remember what happened, but she apparently had fallen and hit the back of her head. She does not know whether she slipped and fell or passed out. She reports having left sided weakness following the episode. MRI negative.  Assessment / Plan / Recommendation Clinical Impression  Cognitive-linguistic evaluation completed.  Patient presents with mild impairments in the areas of selective attention, processing, storage and retrieval of information.   Pain is currently an internal distraction that impacts her ability to concentrate on and recall new information.  Patient aware of current deficits and able to compensate for them with requests for repeats as needed as well as ability to direct things to her husband, who will be primary caregiver upon discharge.  Patient has no further acute needs a this time given that she can recall current safety information; however, requires outpatient SLP therapy to address high-level cognitive-linguistic deficits, maximize functional independence and reduce caregiver burden.         SLP Assessment  All further Speech Lanaguage Pathology  needs can be addressed in the next venue of care    Follow Up Recommendations  24 hour supervision/assistance;Outpatient SLP            Pertinent Vitals/Pain Pain Assessment: 0-10 Pain Score: 6  Pain Location: headache Pain Descriptors / Indicators: Throbbing Pain Intervention(s): Other (comment) (notified nurse)   SLP Goals  Defer to next level of care  SLP Evaluation Prior Functioning  Cognitive/Linguistic Baseline: Within functional limits Type of Home: House  Lives With: Spouse Available Help at Discharge: Family;Available 24 hours/day Vocation: Retired   Associate Professor  Overall Cognitive Status: Impaired/Different from baseline Arousal/Alertness: Awake/alert Orientation Level: Oriented X4 Attention: Sustained Sustained Attention: Appears intact Memory: Impaired Memory Impairment: Storage deficit;Retrieval deficit;Decreased recall of new information;Decreased short term memory Decreased Short Term Memory: Verbal basic;Functional basic Awareness: Appears intact Problem Solving: Appears intact Safety/Judgment: Appears intact    Comprehension  Auditory Comprehension Overall Auditory Comprehension: Impaired Yes/No Questions: Within Functional Limits Commands: Impaired Multistep Basic Commands: 75-100% accurate (100%) Complex Commands: 75-100% accurate (~80%) Conversation: Simple Interfering Components: Pain;Working memory;Processing speed;Attention EffectiveTechniques: Extra processing time;Repetition Visual Recognition/Discrimination Discrimination: Not tested Reading Comprehension Reading Status: Not tested    Expression Expression Primary Mode of Expression: Verbal Verbal Expression Overall Verbal Expression: Appears within functional limits for tasks assessed Other Verbal Expression Comments: very mild anomia x1 during session  Written Expression Written Expression: Not tested   Oral / Motor Oral Motor/Sensory Function Overall Oral Motor/Sensory Function: Appears within functional limits for tasks assessed Motor Speech Overall Motor  Speech: Appears within functional limits for tasks assessed   GO Functional Assessment Tool Used: skilled clinical judgement Functional Limitations: Memory Memory Current Status (J6283): At least 20 percent but less than 40  percent impaired, limited or restricted Memory Goal Status (Y5035): At least 20 percent but less than 40 percent impaired, limited or restricted Memory Discharge Status [phone]): At least 20 percent but less than 40 percent impaired, limited or restricted   Carmelia Roller., CCC-SLP [phone]  [place] 05/30/2014, 2:05 PM

## 2014-06-02 ENCOUNTER — Ambulatory Visit (INDEPENDENT_AMBULATORY_CARE_PROVIDER_SITE_OTHER): Payer: Medicare Other | Admitting: Neurology

## 2014-06-02 ENCOUNTER — Encounter: Payer: Self-pay | Admitting: Neurology

## 2014-06-02 VITALS — BP 145/65 | HR 51 | Temp 97.5°F | Ht 64.0 in | Wt 187.8 lb

## 2014-06-02 DIAGNOSIS — S060X1A Concussion with loss of consciousness of 30 minutes or less, initial encounter: Secondary | ICD-10-CM

## 2014-06-02 DIAGNOSIS — S060X9A Concussion with loss of consciousness of unspecified duration, initial encounter: Secondary | ICD-10-CM | POA: Insufficient documentation

## 2014-06-02 MED ORDER — OXYCODONE-ACETAMINOPHEN 10-325 MG PO TABS: 1.0000 | ORAL_TABLET | Freq: Four times a day (QID) | ORAL | Status: DC | PRN

## 2014-06-02 MED ORDER — NORTRIPTYLINE HCL 10 MG PO CAPS: 10.0000 mg | ORAL_CAPSULE | Freq: Every day | ORAL | Status: DC

## 2014-06-02 NOTE — Progress Notes (Signed)
PATIENT: [name] DOB: 1946/01/08  HISTORICAL  [name] is a 68 years old right-handed Caucasian female, accompanied by her husband, referred by her primary care physician Dr. Donnie Coffin, for evaluation of most recent hospital discharge  She was admitted to the hospital Sunday May 28 2014, 15 minutes after her husband left the house, he received a phone call from patient, told him that she has fell and hurt herself, by the time her husband got home, patient already got up from the floor, slept on the bed, she could not recall the details of the falling, she could remember trying to throw an empty bottle to the recycle bin at the garage, most likely, she has slipped on the water on the garage floor, fell backwards, striking her left occipital area on the floor, there was a big bump at her left skull, her husband tried to get her up out of the bed, she complains of dizziness, confusion, difficulty walking, EMS was called, initially there was a description of left side weakness, slurred speech, she was taken to the hospital, extensive evaluations, CAT scan and MRI of the brain showed large left skull hematoma, no intracranial abnormality, MRA of the brain was normal, CT cervical spine showed marked degenerative disc disease, no significant foraminal or canal stenosis. No fracture.  Laboratory evaluation showed normal CMP, CBC, mild elevated A1c 5.8   Prior to the falling, she was highly functional, ambulate without gait difficulty, now she continued to complains of being forgetful, frequent left-sided headaches, difficulty sleeping, unsteady gait, occasionally blurry vision, no significant dysarthria, no bowel bladder incontinence   She denies seizure-like activity    REVIEW OF SYSTEMS: Full 14 system review of systems performed and notable only for blurred vision, double vision, easy bruising, diarrhea, constipation, palpation, memory loss, headache, dizziness, depression  anxiety  ALLERGIES: Allergies  Allergen Reactions  . Codeine Nausea And Vomiting  . Penicillins Swelling  . Flagyl [Metronidazole] Hives  . Sulfa Antibiotics Nausea And Vomiting    HOME MEDICATIONS: Current Outpatient Prescriptions on File Prior to Visit  Medication Sig Dispense Refill  . Acetaminophen (TYLENOL PO) Take 2 tablets by mouth daily as needed (for pain or headache).      . ALPRAZolam (XANAX) 1 MG tablet Take 2 mg by mouth at bedtime.      Marland Kitchen aspirin EC 81 MG tablet Take 81 mg by mouth at bedtime.      . hydrochlorothiazide (HYDRODIURIL) 25 MG tablet Take 25 mg by mouth daily.      Marland Kitchen ibuprofen (ADVIL,MOTRIN) 200 MG tablet Take 3 tablets (600 mg total) by mouth every 4 (four) hours as needed for fever, headache or mild pain.  30 tablet  0  . Ketotifen Fumarate (ALAWAY OP) Place 5 drops into both eyes daily as needed (for allergies).      . metoprolol tartrate (LOPRESSOR) 25 MG tablet Take 25 mg by mouth 2 (two) times daily.      . sertraline (ZOLOFT) 50 MG tablet Take 50 mg by mouth daily.      . simvastatin (ZOCOR) 40 MG tablet Take 40 mg by mouth at bedtime.       No current facility-administered medications on file prior to visit.    PAST MEDICAL HISTORY: Past Medical History  Diagnosis Date  . Malignant melanoma   . IBS (irritable bowel syndrome)   . Hypertension     PAST SURGICAL HISTORY: No past surgical history on file.  FAMILY HISTORY:  No family history on file.  SOCIAL HISTORY:  History   Social History  . Marital Status: Married    Spouse Name: N/A    Number of Children: N/A  . Years of Education: N/A   Occupational History  . Not on file.   Social History Main Topics  . Smoking status: Never Smoker   . Smokeless tobacco: Not on file  . Alcohol Use: No  . Drug Use: No  . Sexual Activity: Not on file   Other Topics Concern  . Not on file   Social History Narrative  . No narrative on file     PHYSICAL EXAM   There were no vitals  filed for this visit.  Not recorded    There is no weight on file to calculate BMI.   Generalized: In no acute distress  Neck: Supple, no carotid bruits   Cardiac: Regular rate rhythm  Pulmonary: Clear to auscultation bilaterally  Musculoskeletal: No deformity  Neurological examination  Mentation: Alert oriented to time, place, history taking, and causual conversation, large-egg size left posterior skull hematoma, tenderness upon palpation  Cranial nerve II-XII: Pupils were equal round reactive to light. Extraocular movements were full.  Visual field were full on confrontational test. Bilateral fundi were sharp.  Facial sensation and strength were normal. Hearing was intact to finger rubbing bilaterally. Uvula tongue midline.  Head turning and shoulder shrug and were normal and symmetric.Tongue protrusion into cheek strength was normal.  Motor: Normal tone, bulk and strength.  Sensory: Intact to fine touch, pinprick, preserved vibratory sensation, and proprioception at toes.  Coordination: Normal finger to nose, heel-to-shin bilaterally there was no truncal ataxia  Gait: Rising up from seated position without assistance, normal stance, cautious, moderate stride, good arm swing, smooth turning,  Romberg signs: Negative  Deep tendon reflexes: Brachioradialis 2/2, biceps 2/2, triceps 2/2, patellar 2/2, Achilles 2/2, plantar responses were flexor bilaterally.   DIAGNOSTIC DATA (LABS, IMAGING, TESTING) - I reviewed patient records, labs, notes, testing and imaging myself where available.  Lab Results  Component Value Date   WBC 8.7 05/28/2014   HGB 14.3 05/28/2014   HCT 42.0 05/28/2014   MCV 91.7 05/28/2014   PLT 203 05/28/2014      Component Value Date/Time   NA 141 05/28/2014 1736   K 3.3* 05/28/2014 1736   CL 103 05/28/2014 1736   CO2 29 05/28/2014 1706   GLUCOSE 136* 05/28/2014 1736   BUN 14 05/28/2014 1736   CREATININE 0.80 05/28/2014 1736   CALCIUM 8.9 05/28/2014 1706    PROT 6.3 05/28/2014 1706   ALBUMIN 3.5 05/28/2014 1706   AST 32 05/28/2014 1706   ALT 35 05/28/2014 1706   ALKPHOS 74 05/28/2014 1706   BILITOT 0.4 05/28/2014 1706   GFRNONAA 84* 05/28/2014 1706   GFRAA >90 05/28/2014 1706   Lab Results  Component Value Date   CHOL 139 05/29/2014   HDL 34* 05/29/2014   LDLCALC 66 05/29/2014   TRIG 193* 05/29/2014   CHOLHDL 4.1 05/29/2014   Lab Results  Component Value Date   HGBA1C 5.8* 05/29/2014    ASSESSMENT AND PLAN  [name] is a 68 y.o. female with falling on the ground level, sudden loss of consciousness in May 28 2014, with skull hematoma.  1. Concussion, 2. She complains of frequent left-sided headaches, I have getting her prescription of nortriptyline 10 mg every night, Percocet as needed, 3, return to clinic with nurse practitioner in 3-4 weeks   Marcial Pacas,  M.D. Ph.D.  [facility] [address] [place], [place] [address] [phone]

## 2014-06-06 ENCOUNTER — Other Ambulatory Visit: Payer: Self-pay | Admitting: Family Medicine

## 2014-06-06 DIAGNOSIS — N6320 Unspecified lump in the left breast, unspecified quadrant: Principal | ICD-10-CM

## 2014-06-06 DIAGNOSIS — N6325 Unspecified lump in the left breast, overlapping quadrants: Secondary | ICD-10-CM

## 2014-06-09 ENCOUNTER — Ambulatory Visit
Admission: RE | Admit: 2014-06-09 | Discharge: 2014-06-09 | Disposition: A | Discharge status: Home / Self Care | Payer: Medicare Other | Source: Ambulatory Visit | Attending: Family Medicine | Admitting: Family Medicine

## 2014-06-09 DIAGNOSIS — N6320 Unspecified lump in the left breast, unspecified quadrant: Principal | ICD-10-CM

## 2014-06-09 DIAGNOSIS — N6325 Unspecified lump in the left breast, overlapping quadrants: Secondary | ICD-10-CM

## 2014-06-19 ENCOUNTER — Encounter (HOSPITAL_COMMUNITY): Payer: Self-pay | Admitting: Emergency Medicine

## 2014-06-19 DIAGNOSIS — Y9389 Activity, other specified: Secondary | ICD-10-CM | POA: Diagnosis not present

## 2014-06-19 DIAGNOSIS — F3289 Other specified depressive episodes: Secondary | ICD-10-CM | POA: Diagnosis not present

## 2014-06-19 DIAGNOSIS — Z880 Allergy status to penicillin: Secondary | ICD-10-CM | POA: Diagnosis not present

## 2014-06-19 DIAGNOSIS — R296 Repeated falls: Secondary | ICD-10-CM | POA: Insufficient documentation

## 2014-06-19 DIAGNOSIS — Z792 Long term (current) use of antibiotics: Secondary | ICD-10-CM | POA: Insufficient documentation

## 2014-06-19 DIAGNOSIS — Z79899 Other long term (current) drug therapy: Secondary | ICD-10-CM | POA: Diagnosis not present

## 2014-06-19 DIAGNOSIS — Y9289 Other specified places as the place of occurrence of the external cause: Secondary | ICD-10-CM | POA: Insufficient documentation

## 2014-06-19 DIAGNOSIS — I10 Essential (primary) hypertension: Secondary | ICD-10-CM | POA: Insufficient documentation

## 2014-06-19 DIAGNOSIS — Z85820 Personal history of malignant melanoma of skin: Secondary | ICD-10-CM | POA: Insufficient documentation

## 2014-06-19 DIAGNOSIS — K589 Irritable bowel syndrome without diarrhea: Secondary | ICD-10-CM | POA: Diagnosis not present

## 2014-06-19 DIAGNOSIS — S0190XA Unspecified open wound of unspecified part of head, initial encounter: Secondary | ICD-10-CM | POA: Diagnosis not present

## 2014-06-19 DIAGNOSIS — Z4800 Encounter for change or removal of nonsurgical wound dressing: Secondary | ICD-10-CM | POA: Diagnosis present

## 2014-06-19 DIAGNOSIS — F329 Major depressive disorder, single episode, unspecified: Secondary | ICD-10-CM | POA: Insufficient documentation

## 2014-06-19 NOTE — ED Notes (Signed)
Pt reports fell x 3 weeks ago and had a large hematoma in back of her head.  Tonight pt states she was washing her hair when a "chunk" of hair came out leaving a "hole" in her scalp.  Open wound noted, no active draining at this time.

## 2014-06-20 ENCOUNTER — Emergency Department (HOSPITAL_COMMUNITY)
Admission: EM | Admit: 2014-06-20 | Discharge: 2014-06-20 | Disposition: A | Discharge status: Home / Self Care | Payer: Medicare Other | Attending: Emergency Medicine | Admitting: Emergency Medicine

## 2014-06-20 DIAGNOSIS — T148XXA Other injury of unspecified body region, initial encounter: Secondary | ICD-10-CM

## 2014-06-20 MED ORDER — MUPIROCIN CALCIUM 2 % EX CREA
TOPICAL_CREAM | Freq: Once | CUTANEOUS | Status: AC
  Administered 2014-06-20: 1 via TOPICAL
  Filled 2014-06-20: qty 15

## 2014-06-20 MED ORDER — CLINDAMYCIN HCL 300 MG PO CAPS
300.0000 mg | ORAL_CAPSULE | Freq: Once | ORAL | Status: AC
  Administered 2014-06-20: 300 mg via ORAL
  Filled 2014-06-20: qty 1

## 2014-06-20 MED ORDER — CLINDAMYCIN HCL 300 MG PO CAPS: 300.0000 mg | ORAL_CAPSULE | Freq: Three times a day (TID) | ORAL | Status: DC

## 2014-06-20 NOTE — ED Provider Notes (Signed)
CSN: [id number]     Arrival date & time 06/19/14  2046 History   First MD Initiated Contact with Patient 06/20/14 0130     Chief Complaint  Patient presents with  . Wound Check     (Consider location/radiation/quality/duration/timing/severity/associated sxs/prior Treatment) HPI Comments: Patient had a fall 3, weeks, ago, sustaining a large hematoma to the back of her head.  Tonight, while she was washing her hair.  The hematoma Came off revealing a large area of raw tissue shows small bit of bleeding.  At that time has since stopped.  Her husband.  Wash the area carefully, and applied.  Antibiotic ointment.   Denies any headache, or fever  Patient is a 68 y.o. female presenting with wound check. The history is provided by the patient.  Wound Check This is a recurrent problem. The current episode started 1 to 4 weeks ago. The problem occurs constantly. The problem has been gradually worsening. Pertinent negatives include no fever or headaches. Nothing aggravates the symptoms. She has tried nothing for the symptoms. The treatment provided no relief.    Past Medical History  Diagnosis Date  . Malignant melanoma   . IBS (irritable bowel syndrome)   . Hypertension   . Depression    Past Surgical History  Procedure Laterality Date  . Foot surgery Bilateral   . Melanoma excision    . Vaginal hysterectomy    . Hernia repair     Family History  Problem Relation Age of Onset  . Heart attack Mother   . Hypertension Mother   . Cancer Father   . Diabetes Father   . Hypertension Father    History  Substance Use Topics  . Smoking status: Never Smoker   . Smokeless tobacco: Never Used  . Alcohol Use: No   OB History   Grav Para Term Preterm Abortions TAB SAB Ect Mult Living                 Review of Systems  Constitutional: Negative for fever.  Skin: Positive for wound.  Neurological: Negative for dizziness and headaches.  All other systems reviewed and are  negative.     Allergies  Codeine; Penicillins; Flagyl; and Sulfa antibiotics  Home Medications   Prior to Admission medications   Medication Sig Start Date End Date Taking? Authorizing Provider  Acetaminophen (TYLENOL PO) Take 2 tablets by mouth daily as needed (for pain or headache).   Yes Historical Provider, MD  ALPRAZolam Duanne Moron) 1 MG tablet Take 2 mg by mouth at bedtime. 05/16/14  Yes Historical Provider, MD  aspirin EC 81 MG tablet Take 81 mg by mouth at bedtime.   Yes Historical Provider, MD  conjugated estrogens (PREMARIN) vaginal cream Place 1 Applicatorful vaginally once a week.   Yes Historical Provider, MD  hydrochlorothiazide (HYDRODIURIL) 25 MG tablet Take 25 mg by mouth daily. 05/11/14  Yes Historical Provider, MD  ibuprofen (ADVIL,MOTRIN) 200 MG tablet Take 3 tablets (600 mg total) by mouth every 4 (four) hours as needed for fever, headache or mild pain. 05/30/14  Yes Debbe Odea, MD  Ketotifen Fumarate (ALAWAY OP) Place 5 drops into both eyes daily as needed (for allergies).   Yes Historical Provider, MD  metoprolol tartrate (LOPRESSOR) 25 MG tablet Take 25 mg by mouth 2 (two) times daily. 05/11/14  Yes Historical Provider, MD  oxyCODONE-acetaminophen (PERCOCET) 10-325 MG per tablet Take 1 tablet by mouth every 6 (six) hours as needed for pain. 06/02/14  Yes Marcial Pacas, MD  sertraline (ZOLOFT) 50 MG tablet Take 50 mg by mouth daily. 05/06/14  Yes Historical Provider, MD  simvastatin (ZOCOR) 40 MG tablet Take 40 mg by mouth at bedtime. 03/31/14  Yes Historical Provider, MD  triamcinolone cream (KENALOG) 0.5 % Apply 1 application topically as needed (for skin irritation).  06/06/14  Yes Historical Provider, MD  clindamycin (CLEOCIN) 300 MG capsule Take 1 capsule (300 mg total) by mouth 3 (three) times daily. 06/20/14   Garald Balding, NP   BP 145/70  Pulse 49  Temp(Src) 98.2 F (36.8 C) (Oral)  Resp 18  SpO2 97% Physical Exam  Nursing note and vitals reviewed. Constitutional: She  appears well-developed and well-nourished. No distress.  HENT:  Head: Normocephalic.    Eyes: Pupils are equal, round, and reactive to light.  Neck: Normal range of motion.  Cardiovascular: Normal rate.   Pulmonary/Chest: Effort normal.  Musculoskeletal: Normal range of motion.  Neurological: She is alert.  Skin: Skin is warm.    ED Course  Procedures (including critical care time) Labs Review Labs Reviewed - No data to display  Imaging Review No results found.   EKG Interpretation None      MDM   Final diagnoses:  Wound, open    Antibiotic ointment topically daily as well as Clindamycin   FU with PCP ro wound check in 3 days    Garald Balding, NP 06/20/14 0236

## 2014-06-20 NOTE — ED Provider Notes (Signed)
Medical screening examination/treatment/procedure(s) were performed by non-physician practitioner and as supervising physician I was immediately available for consultation/collaboration.   EKG Interpretation None       Jayliani Wanner K Kimmora Risenhoover-Rasch, MD 06/20/14 [phone]

## 2014-06-20 NOTE — Discharge Instructions (Signed)
Wash the area once a day than apply a small amount of Bactroban ointment  Take all the antibiotic by mouth until all tablets completed Please make an appointment with Dr. Alroy Dust fro a wound check on Thursday evening or Friday

## 2014-07-03 ENCOUNTER — Ambulatory Visit: Payer: Medicare Other | Admitting: Adult Health

## 2015-01-12 ENCOUNTER — Other Ambulatory Visit: Payer: Self-pay

## 2015-01-12 DIAGNOSIS — Z1231 Encounter for screening mammogram for malignant neoplasm of breast: Secondary | ICD-10-CM

## 2015-02-05 ENCOUNTER — Ambulatory Visit
Admission: RE | Admit: 2015-02-05 | Discharge: 2015-02-05 | Disposition: A | Discharge status: Home / Self Care | Payer: PPO | Source: Ambulatory Visit

## 2015-02-05 DIAGNOSIS — Z1231 Encounter for screening mammogram for malignant neoplasm of breast: Secondary | ICD-10-CM

## 2015-04-09 ENCOUNTER — Other Ambulatory Visit: Payer: Self-pay

## 2015-08-05 ENCOUNTER — Encounter (HOSPITAL_COMMUNITY): Payer: Self-pay | Admitting: Emergency Medicine

## 2015-08-05 ENCOUNTER — Emergency Department (INDEPENDENT_AMBULATORY_CARE_PROVIDER_SITE_OTHER)
Admission: EM | Admit: 2015-08-05 | Discharge: 2015-08-05 | Disposition: A | Discharge status: Home / Self Care | Payer: PPO | Source: Home / Self Care | Attending: Family Medicine | Admitting: Family Medicine

## 2015-08-05 DIAGNOSIS — L538 Other specified erythematous conditions: Secondary | ICD-10-CM | POA: Diagnosis not present

## 2015-08-05 DIAGNOSIS — R519 Headache, unspecified: Secondary | ICD-10-CM

## 2015-08-05 DIAGNOSIS — R110 Nausea: Secondary | ICD-10-CM | POA: Diagnosis not present

## 2015-08-05 DIAGNOSIS — R51 Headache: Secondary | ICD-10-CM

## 2015-08-05 MED ORDER — PROMETHAZINE HCL 25 MG PO TABS: 25.0000 mg | ORAL_TABLET | Freq: Three times a day (TID) | ORAL | Status: DC | PRN

## 2015-08-05 MED ORDER — HYDROCORTISONE VALERATE 0.2 % EX OINT: 1.0000 "application " | TOPICAL_OINTMENT | Freq: Two times a day (BID) | CUTANEOUS | Status: DC

## 2015-08-05 MED ORDER — PROMETHAZINE HCL 12.5 MG PO TABS: 12.5000 mg | ORAL_TABLET | Freq: Three times a day (TID) | ORAL | Status: DC | PRN

## 2015-08-05 NOTE — ED Notes (Signed)
Received flu shot last Sunday 10/16.  Felt bad and noticed rash on forehead on Monday.  Called pcp started prednisone on Tuesday.  Forehead itching, left arm with rash.  Generalized feeling bad.  C/o dizziness, nausea, headache, light sensitivity.

## 2015-08-05 NOTE — ED Provider Notes (Signed)
CSN: [id number]     Arrival date & time 08/05/15  1345 History   None    Chief Complaint  Patient presents with  . Rash  . Nausea  . Headache   (Consider location/radiation/quality/duration/timing/severity/associated sxs/prior Treatment) Patient is a 69 y.o. female presenting with rash and headaches. The history is provided by the patient. No language interpreter was used.  Rash Location:  Head/neck and shoulder/arm Head/neck rash location:  Head Shoulder/arm rash location:  L arm Quality: itchiness and redness   Quality: not painful and not weeping   Quality comment:  Red rash Severity:  Moderate Onset quality:  Gradual Progression:  Spreading Chronicity:  New Context: plant contact   Context: not animal contact, not exposure to similar rash, not medications, not pollen, not sick contacts and not sun exposure   Context comment:  Flu shot a day before rash started on her right arm. She has never had probles with flu shots in the past. She was in the garden 2 days before the rash broke up, but noo major plant exposure Relieved by:  Antihistamines Worsened by:  Nothing tried Associated symptoms: diarrhea, fatigue, headaches and nausea   Associated symptoms: no fever, no periorbital edema, no shortness of breath, no throat swelling and no tongue swelling   Associated symptoms comment:  Burning in her eyes Headache Pain location:  Generalized Severity currently:  3/10 Severity at highest:  8/10 Onset quality:  Gradual Duration:  6 days Timing:  Intermittent Progression:  Unchanged Context: activity and bright light   Relieved by:  NSAIDs Associated symptoms: diarrhea, facial pain, fatigue and nausea   Associated symptoms: no blurred vision, no cough, no fever, no near-syncope, no neck pain, no neck stiffness and no visual change     Past Medical History  Diagnosis Date  . Malignant melanoma ([place])   . IBS (irritable bowel syndrome)   . Hypertension   . Depression     Past Surgical History  Procedure Laterality Date  . Foot surgery Bilateral   . Melanoma excision    . Vaginal hysterectomy    . Hernia repair     Family History  Problem Relation Age of Onset  . Heart attack Mother   . Hypertension Mother   . Cancer Father   . Diabetes Father   . Hypertension Father    Social History  Substance Use Topics  . Smoking status: Never Smoker   . Smokeless tobacco: Never Used  . Alcohol Use: No   OB History    No data available     Review of Systems  Constitutional: Positive for fatigue. Negative for fever.  Eyes: Negative for blurred vision.  Respiratory: Negative for cough, chest tightness and shortness of breath.   Cardiovascular: Negative.  Negative for chest pain and near-syncope.  Gastrointestinal: Positive for nausea and diarrhea.  Genitourinary: Negative.   Musculoskeletal: Negative for neck pain and neck stiffness.  Skin: Positive for rash.  Neurological: Positive for headaches.  All other systems reviewed and are negative.   Allergies  Codeine; Penicillins; Flagyl; and Sulfa antibiotics  Home Medications   Prior to Admission medications   Medication Sig Start Date End Date Taking? Authorizing Provider  Acetaminophen (TYLENOL PO) Take 2 tablets by mouth daily as needed (for pain or headache).    Historical Provider, MD  ALPRAZolam Duanne Moron) 1 MG tablet Take 2 mg by mouth at bedtime. 05/16/14   Historical Provider, MD  aspirin EC 81 MG tablet Take 81 mg by mouth at  bedtime.    Historical Provider, MD  clindamycin (CLEOCIN) 300 MG capsule Take 1 capsule (300 mg total) by mouth 3 (three) times daily. 06/20/14   Junius Creamer, NP  conjugated estrogens (PREMARIN) vaginal cream Place 1 Applicatorful vaginally once a week.    Historical Provider, MD  hydrochlorothiazide (HYDRODIURIL) 25 MG tablet Take 25 mg by mouth daily. 05/11/14   Historical Provider, MD  ibuprofen (ADVIL,MOTRIN) 200 MG tablet Take 3 tablets (600 mg total) by mouth every  4 (four) hours as needed for fever, headache or mild pain. 05/30/14   Debbe Odea, MD  Ketotifen Fumarate (ALAWAY OP) Place 5 drops into both eyes daily as needed (for allergies).    Historical Provider, MD  metoprolol tartrate (LOPRESSOR) 25 MG tablet Take 25 mg by mouth 2 (two) times daily. 05/11/14   Historical Provider, MD  oxyCODONE-acetaminophen (PERCOCET) 10-325 MG per tablet Take 1 tablet by mouth every 6 (six) hours as needed for pain. 06/02/14   Marcial Pacas, MD  sertraline (ZOLOFT) 50 MG tablet Take 50 mg by mouth daily. 05/06/14   Historical Provider, MD  simvastatin (ZOCOR) 40 MG tablet Take 40 mg by mouth at bedtime. 03/31/14   Historical Provider, MD  triamcinolone cream (KENALOG) 0.5 % Apply 1 application topically as needed (for skin irritation).  06/06/14   Historical Provider, MD   Meds Ordered and Administered this Visit  Medications - No data to display  BP 144/76 mmHg  Pulse 50  Temp(Src) 99.3 F (37.4 C) (Oral)  Resp 18  SpO2 96% No data found.   Physical Exam  Constitutional: She is oriented to person, place, and time. She appears well-developed. No distress.  Cardiovascular: Normal rate, regular rhythm and normal heart sounds.   No murmur heard. Pulmonary/Chest: Effort normal and breath sounds normal. No respiratory distress. She has no wheezes.  Abdominal: Soft. Bowel sounds are normal. She exhibits no distension and no mass. There is no tenderness.  Musculoskeletal: Normal range of motion. She exhibits no edema.  Neurological: She is alert and oriented to person, place, and time. She has normal reflexes. She displays normal reflexes. No cranial nerve deficit. She exhibits normal muscle tone. Coordination normal. She displays no Babinski's sign on the right side. She displays no Babinski's sign on the left side.  Neg Kernig sign  Skin:     Nursing note and vitals reviewed.   ED Course  Procedures (including critical care time)  Labs Review Labs Reviewed - No  data to display  Imaging Review No results found.   Visual Acuity Review  Right Eye Distance:   Left Eye Distance:   Bilateral Distance:    Right Eye Near:   Left Eye Near:    Bilateral Near:         MDM  No diagnosis found. Non-specific skin rash Headache Nausea  Etiology of her rash is unclear. ?? Reaction to flu vaccination vs viral exanthem. Patient already given oral steroid by her PCP which she will complete last dose tomorrow. Rash seems to be resolving per patient. Topical corticosteroid prescribed to help with itchy and mild erythema.   Headache might be part of viral illness vs stress. No signs of meningeal irritation or neurologic deficit. Patient to continue Tylenol or Ibuprofen as needed for headache. I discussed red flag signs with her such as change in vision, worsening headache, tremors e.t.c She is advised to go to the ED if such things happens or if her headache worsens.  She requested refill  of phenergan prn nausea which I gave her. I recommended hydration and rest at home. Return to see PCP in few days or to the ED if symptoms worsens. She agreed with plan.    Kinnie Feil, MD 08/05/15 [phone]

## 2015-08-05 NOTE — Discharge Instructions (Signed)

## 2015-10-30 DIAGNOSIS — N951 Menopausal and female climacteric states: Secondary | ICD-10-CM | POA: Diagnosis not present

## 2015-10-30 DIAGNOSIS — F0781 Postconcussional syndrome: Secondary | ICD-10-CM | POA: Diagnosis not present

## 2015-10-30 DIAGNOSIS — F411 Generalized anxiety disorder: Secondary | ICD-10-CM | POA: Diagnosis not present

## 2015-10-30 DIAGNOSIS — I10 Essential (primary) hypertension: Secondary | ICD-10-CM | POA: Diagnosis not present

## 2015-10-30 DIAGNOSIS — E782 Mixed hyperlipidemia: Secondary | ICD-10-CM | POA: Diagnosis not present

## 2015-10-30 DIAGNOSIS — M542 Cervicalgia: Secondary | ICD-10-CM | POA: Diagnosis not present

## 2015-11-19 DIAGNOSIS — R300 Dysuria: Secondary | ICD-10-CM | POA: Diagnosis not present

## 2015-12-03 DIAGNOSIS — J209 Acute bronchitis, unspecified: Secondary | ICD-10-CM | POA: Diagnosis not present

## 2015-12-06 DIAGNOSIS — Z780 Asymptomatic menopausal state: Secondary | ICD-10-CM | POA: Diagnosis not present

## 2016-01-14 DIAGNOSIS — J209 Acute bronchitis, unspecified: Secondary | ICD-10-CM | POA: Diagnosis not present

## 2016-01-17 ENCOUNTER — Other Ambulatory Visit: Payer: Self-pay

## 2016-01-17 DIAGNOSIS — Z1231 Encounter for screening mammogram for malignant neoplasm of breast: Secondary | ICD-10-CM

## 2016-02-06 ENCOUNTER — Ambulatory Visit: Payer: Self-pay

## 2016-03-06 ENCOUNTER — Ambulatory Visit: Payer: Self-pay

## 2016-03-24 DIAGNOSIS — M5116 Intervertebral disc disorders with radiculopathy, lumbar region: Secondary | ICD-10-CM | POA: Diagnosis not present

## 2016-03-24 DIAGNOSIS — M5441 Lumbago with sciatica, right side: Secondary | ICD-10-CM | POA: Diagnosis not present

## 2016-04-21 DIAGNOSIS — F0781 Postconcussional syndrome: Secondary | ICD-10-CM | POA: Diagnosis not present

## 2016-04-21 DIAGNOSIS — L309 Dermatitis, unspecified: Secondary | ICD-10-CM | POA: Diagnosis not present

## 2016-05-05 DIAGNOSIS — F411 Generalized anxiety disorder: Secondary | ICD-10-CM | POA: Diagnosis not present

## 2016-05-05 DIAGNOSIS — E782 Mixed hyperlipidemia: Secondary | ICD-10-CM | POA: Diagnosis not present

## 2016-05-05 DIAGNOSIS — I10 Essential (primary) hypertension: Secondary | ICD-10-CM | POA: Diagnosis not present

## 2016-07-03 DIAGNOSIS — H2589 Other age-related cataract: Secondary | ICD-10-CM | POA: Diagnosis not present

## 2016-07-03 DIAGNOSIS — H04123 Dry eye syndrome of bilateral lacrimal glands: Secondary | ICD-10-CM | POA: Diagnosis not present

## 2016-07-03 DIAGNOSIS — H40013 Open angle with borderline findings, low risk, bilateral: Secondary | ICD-10-CM | POA: Diagnosis not present

## 2016-07-03 DIAGNOSIS — H2513 Age-related nuclear cataract, bilateral: Secondary | ICD-10-CM | POA: Diagnosis not present

## 2016-07-15 DIAGNOSIS — D1801 Hemangioma of skin and subcutaneous tissue: Secondary | ICD-10-CM | POA: Diagnosis not present

## 2016-07-15 DIAGNOSIS — L814 Other melanin hyperpigmentation: Secondary | ICD-10-CM | POA: Diagnosis not present

## 2016-07-15 DIAGNOSIS — Z85820 Personal history of malignant melanoma of skin: Secondary | ICD-10-CM | POA: Diagnosis not present

## 2016-07-15 DIAGNOSIS — M795 Residual foreign body in soft tissue: Secondary | ICD-10-CM | POA: Diagnosis not present

## 2016-07-15 DIAGNOSIS — L821 Other seborrheic keratosis: Secondary | ICD-10-CM | POA: Diagnosis not present

## 2016-07-15 DIAGNOSIS — D225 Melanocytic nevi of trunk: Secondary | ICD-10-CM | POA: Diagnosis not present

## 2016-07-15 DIAGNOSIS — S60552A Superficial foreign body of left hand, initial encounter: Secondary | ICD-10-CM | POA: Diagnosis not present

## 2016-07-23 ENCOUNTER — Other Ambulatory Visit: Payer: Self-pay | Admitting: Family Medicine

## 2016-09-02 DIAGNOSIS — L820 Inflamed seborrheic keratosis: Secondary | ICD-10-CM | POA: Diagnosis not present

## 2016-10-02 DIAGNOSIS — Z0000 Encounter for general adult medical examination without abnormal findings: Secondary | ICD-10-CM | POA: Diagnosis not present

## 2016-10-02 DIAGNOSIS — N952 Postmenopausal atrophic vaginitis: Secondary | ICD-10-CM | POA: Diagnosis not present

## 2016-10-02 DIAGNOSIS — I10 Essential (primary) hypertension: Secondary | ICD-10-CM | POA: Diagnosis not present

## 2016-10-02 DIAGNOSIS — E782 Mixed hyperlipidemia: Secondary | ICD-10-CM | POA: Diagnosis not present

## 2016-10-02 DIAGNOSIS — F411 Generalized anxiety disorder: Secondary | ICD-10-CM | POA: Diagnosis not present

## 2016-10-02 DIAGNOSIS — J309 Allergic rhinitis, unspecified: Secondary | ICD-10-CM | POA: Diagnosis not present

## 2016-12-04 DIAGNOSIS — E7800 Pure hypercholesterolemia, unspecified: Secondary | ICD-10-CM | POA: Diagnosis not present

## 2017-01-21 DIAGNOSIS — R399 Unspecified symptoms and signs involving the genitourinary system: Secondary | ICD-10-CM | POA: Diagnosis not present

## 2017-05-30 DIAGNOSIS — H6980 Other specified disorders of Eustachian tube, unspecified ear: Secondary | ICD-10-CM | POA: Diagnosis not present

## 2017-07-03 DIAGNOSIS — H40013 Open angle with borderline findings, low risk, bilateral: Secondary | ICD-10-CM | POA: Diagnosis not present

## 2017-07-03 DIAGNOSIS — H04123 Dry eye syndrome of bilateral lacrimal glands: Secondary | ICD-10-CM | POA: Diagnosis not present

## 2017-07-03 DIAGNOSIS — H2589 Other age-related cataract: Secondary | ICD-10-CM | POA: Diagnosis not present

## 2017-07-03 DIAGNOSIS — H2513 Age-related nuclear cataract, bilateral: Secondary | ICD-10-CM | POA: Diagnosis not present

## 2017-09-24 DIAGNOSIS — J209 Acute bronchitis, unspecified: Secondary | ICD-10-CM | POA: Diagnosis not present

## 2017-09-26 DIAGNOSIS — J069 Acute upper respiratory infection, unspecified: Secondary | ICD-10-CM | POA: Diagnosis not present

## 2017-09-26 DIAGNOSIS — J209 Acute bronchitis, unspecified: Secondary | ICD-10-CM | POA: Diagnosis not present

## 2017-10-26 DIAGNOSIS — F411 Generalized anxiety disorder: Secondary | ICD-10-CM | POA: Diagnosis not present

## 2017-10-26 DIAGNOSIS — N952 Postmenopausal atrophic vaginitis: Secondary | ICD-10-CM | POA: Diagnosis not present

## 2017-10-26 DIAGNOSIS — E782 Mixed hyperlipidemia: Secondary | ICD-10-CM | POA: Diagnosis not present

## 2017-10-26 DIAGNOSIS — I10 Essential (primary) hypertension: Secondary | ICD-10-CM | POA: Diagnosis not present

## 2017-10-26 DIAGNOSIS — Z0000 Encounter for general adult medical examination without abnormal findings: Secondary | ICD-10-CM | POA: Diagnosis not present

## 2017-10-26 DIAGNOSIS — Z1159 Encounter for screening for other viral diseases: Secondary | ICD-10-CM | POA: Diagnosis not present

## 2017-10-26 DIAGNOSIS — J309 Allergic rhinitis, unspecified: Secondary | ICD-10-CM | POA: Diagnosis not present

## 2017-11-19 DIAGNOSIS — R300 Dysuria: Secondary | ICD-10-CM | POA: Diagnosis not present

## 2017-11-19 DIAGNOSIS — N3000 Acute cystitis without hematuria: Secondary | ICD-10-CM | POA: Diagnosis not present

## 2017-11-20 ENCOUNTER — Other Ambulatory Visit: Payer: Self-pay | Admitting: Family Medicine

## 2017-11-20 DIAGNOSIS — Z139 Encounter for screening, unspecified: Secondary | ICD-10-CM

## 2017-12-08 ENCOUNTER — Ambulatory Visit: Payer: Self-pay

## 2017-12-23 ENCOUNTER — Ambulatory Visit
Admission: RE | Admit: 2017-12-23 | Discharge: 2017-12-23 | Disposition: A | Discharge status: Home / Self Care | Payer: PPO | Source: Ambulatory Visit | Attending: Family Medicine | Admitting: Family Medicine

## 2017-12-23 DIAGNOSIS — Z1231 Encounter for screening mammogram for malignant neoplasm of breast: Secondary | ICD-10-CM | POA: Diagnosis not present

## 2017-12-23 DIAGNOSIS — Z139 Encounter for screening, unspecified: Secondary | ICD-10-CM

## 2018-01-06 ENCOUNTER — Encounter (INDEPENDENT_AMBULATORY_CARE_PROVIDER_SITE_OTHER): Payer: Self-pay | Admitting: Orthopedic Surgery

## 2018-01-06 ENCOUNTER — Ambulatory Visit (INDEPENDENT_AMBULATORY_CARE_PROVIDER_SITE_OTHER): Payer: PPO

## 2018-01-06 ENCOUNTER — Ambulatory Visit (INDEPENDENT_AMBULATORY_CARE_PROVIDER_SITE_OTHER): Payer: PPO | Admitting: Orthopedic Surgery

## 2018-01-06 DIAGNOSIS — G8929 Other chronic pain: Secondary | ICD-10-CM

## 2018-01-06 DIAGNOSIS — M5441 Lumbago with sciatica, right side: Secondary | ICD-10-CM

## 2018-01-06 MED ORDER — MELOXICAM 7.5 MG PO TABS: ORAL_TABLET | ORAL | 0 refills | Status: DC

## 2018-01-06 MED ORDER — HYDROMORPHONE HCL 2 MG PO TABS: 1.0000 mg | ORAL_TABLET | Freq: Two times a day (BID) | ORAL | 0 refills | Status: DC | PRN

## 2018-01-06 MED ORDER — CYCLOBENZAPRINE HCL 10 MG PO TABS: ORAL_TABLET | ORAL | 0 refills | Status: DC

## 2018-01-06 MED ORDER — PROMETHAZINE HCL 25 MG PO TABS: 25.0000 mg | ORAL_TABLET | Freq: Three times a day (TID) | ORAL | 0 refills | Status: DC | PRN

## 2018-01-10 ENCOUNTER — Encounter (INDEPENDENT_AMBULATORY_CARE_PROVIDER_SITE_OTHER): Payer: Self-pay | Admitting: Orthopedic Surgery

## 2018-01-10 NOTE — Progress Notes (Signed)
Office Visit Note   Patient: [name]           Date of Birth: 1946/06/08           MRN: [id number] Visit Date: 01/06/2018 Requested by: Alroy Dust, L.Marlou Sa, [address] [employer] [place], [place] [address] PCP: Alroy Dust, L.Marlou Sa, MD  Subjective: Chief Complaint  Patient presents with  . Lower Back - Pain    HPI: [name] is a patient with a chief complaint today of "my back hurts and I want a shot".  Started about 2 weeks ago after doing a lot of sewing and cleaning while she was leaning over a bathtub.  She has had previous epidural steroid injection with Dr. Ernestina Patches but he wanted her to have a return office visit before scheduling for another injection.  Last injection was done 12/28/2014.  The patient describes right leg numbness tingling and radicular pain.  She denies any left-sided symptoms.  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  She has taken some medication from Dr. Paulla Fore without much help.  Diclofenac was not helpful.  She describes her pain as severe.  She denies any bowel and bladder symptoms or fevers and chills.              ROS: All systems reviewed are negative as they relate to the chief complaint within the history of present illness.  Patient denies  fevers or chills.   Assessment & Plan: Visit Diagnoses:  1. Chronic midline low back pain with right-sided sciatica     Plan: Impression is back pain with likely progression of disc protrusion from the annular tear that was present at L4-5 in 2016.  Plan is to schedule her for an injection sometime within the next 2 weeks either here or agrees for imaging.  Flexeril prescribed along with Mobic Phenergan and limited number of Dilaudid for severe pain.  I will see her back as needed.  Follow-Up Instructions: Return if symptoms worsen or fail to improve.   Orders:  Orders Placed This Encounter  Procedures  . XR Lumbar Spine 2-3 Views  . Ambulatory referral to Physical Medicine Rehab   Meds ordered  this encounter  Medications  . cyclobenzaprine (FLEXERIL) 10 MG tablet    Sig: 1 po bid prn spasms    Dispense:  30 tablet    Refill:  0  . meloxicam (MOBIC) 7.5 MG tablet    Sig: 1 po bid x 3 weeks    Dispense:  42 tablet    Refill:  0  . promethazine (PHENERGAN) 25 MG tablet    Sig: Take 1 tablet (25 mg total) by mouth every 8 (eight) hours as needed for nausea or vomiting.    Dispense:  12 tablet    Refill:  0  . HYDROmorphone (DILAUDID) 2 MG tablet    Sig: Take 0.5 tablets (1 mg total) by mouth every 12 (twelve) hours as needed for severe pain.    Dispense:  16 tablet    Refill:  0      Procedures: No procedures performed   Clinical Data: No additional findings.  Objective: Vital Signs: There were no vitals taken for this visit.  Physical Exam:   Constitutional: Patient appears well-developed HEENT:  Head: Normocephalic Eyes:EOM are normal Neck: Normal range of motion Cardiovascular: Normal rate Pulmonary/chest: Effort normal Neurologic: Patient is alert Skin: Skin is warm Psychiatric: Patient has normal mood and affect    Ortho Exam: Orthopedic exam  demonstrates nerve root tension signs on the right but negative on the left.  Reflexes generally symmetric 0 to 1+ out of 4 bilateral patella and Achilles.  Patient does not have any groin pain with internal/external rotation of the leg.  No other masses lymphadenopathy or skin changes noted in that left leg region or right leg region.  No muscle atrophy or weakness is present.  Specialty Comments:  No specialty comments available.  Imaging: No results found.   PMFS History: Patient Active Problem List   Diagnosis Date Noted  . Concussion with loss of consciousness 06/02/2014  . Left-sided weakness 05/28/2014  . Syncope and collapse 05/28/2014  . Scalp contusion 05/28/2014  . Hypertension    Past Medical History:  Diagnosis Date  . Depression   . Hypertension   . IBS (irritable bowel syndrome)   .  Malignant melanoma ([place])     Family History  Problem Relation Age of Onset  . Heart attack Mother   . Hypertension Mother   . Cancer Father   . Diabetes Father   . Hypertension Father     Past Surgical History:  Procedure Laterality Date  . FOOT SURGERY Bilateral   . HERNIA REPAIR    . MELANOMA EXCISION    . VAGINAL HYSTERECTOMY     Social History   Occupational History  . Occupation: retired  Tobacco Use  . Smoking status: Never Smoker  . Smokeless tobacco: Never Used  Substance and Sexual Activity  . Alcohol use: No  . Drug use: No  . Sexual activity: Not on file

## 2018-01-13 DIAGNOSIS — D225 Melanocytic nevi of trunk: Secondary | ICD-10-CM | POA: Diagnosis not present

## 2018-01-13 DIAGNOSIS — D1801 Hemangioma of skin and subcutaneous tissue: Secondary | ICD-10-CM | POA: Diagnosis not present

## 2018-01-13 DIAGNOSIS — L570 Actinic keratosis: Secondary | ICD-10-CM | POA: Diagnosis not present

## 2018-01-13 DIAGNOSIS — L820 Inflamed seborrheic keratosis: Secondary | ICD-10-CM | POA: Diagnosis not present

## 2018-01-13 DIAGNOSIS — R202 Paresthesia of skin: Secondary | ICD-10-CM | POA: Diagnosis not present

## 2018-01-13 DIAGNOSIS — L821 Other seborrheic keratosis: Secondary | ICD-10-CM | POA: Diagnosis not present

## 2018-01-13 DIAGNOSIS — Z85820 Personal history of malignant melanoma of skin: Secondary | ICD-10-CM | POA: Diagnosis not present

## 2018-01-13 DIAGNOSIS — D485 Neoplasm of uncertain behavior of skin: Secondary | ICD-10-CM | POA: Diagnosis not present

## 2018-01-20 ENCOUNTER — Telehealth (INDEPENDENT_AMBULATORY_CARE_PROVIDER_SITE_OTHER): Payer: Self-pay | Admitting: Orthopedic Surgery

## 2018-01-20 NOTE — Telephone Encounter (Signed)
Ok to rf  - not sure about earlier appt at this time

## 2018-01-20 NOTE — Telephone Encounter (Signed)
Any suggestions? [facility] first available is 2 weeks out. Please advise. Ok to refill meds?

## 2018-01-21 ENCOUNTER — Other Ambulatory Visit (INDEPENDENT_AMBULATORY_CARE_PROVIDER_SITE_OTHER): Payer: Self-pay

## 2018-01-21 MED ORDER — CYCLOBENZAPRINE HCL 10 MG PO TABS: ORAL_TABLET | ORAL | 0 refills | Status: DC

## 2018-01-21 MED ORDER — HYDROMORPHONE HCL 2 MG PO TABS: 1.0000 mg | ORAL_TABLET | Freq: Two times a day (BID) | ORAL | 0 refills | Status: DC | PRN

## 2018-01-21 NOTE — Telephone Encounter (Signed)
Dr Marlou Sa refilled the medication. She is keeping appt for Monday.

## 2018-01-25 ENCOUNTER — Ambulatory Visit (INDEPENDENT_AMBULATORY_CARE_PROVIDER_SITE_OTHER): Payer: PPO

## 2018-01-25 ENCOUNTER — Ambulatory Visit (INDEPENDENT_AMBULATORY_CARE_PROVIDER_SITE_OTHER): Payer: PPO | Admitting: Physical Medicine and Rehabilitation

## 2018-01-25 ENCOUNTER — Encounter (INDEPENDENT_AMBULATORY_CARE_PROVIDER_SITE_OTHER): Payer: Self-pay | Admitting: Physical Medicine and Rehabilitation

## 2018-01-25 VITALS — BP 134/63 | HR 60 | Temp 98.4°F

## 2018-01-25 DIAGNOSIS — M5416 Radiculopathy, lumbar region: Secondary | ICD-10-CM

## 2018-01-25 MED ORDER — METHYLPREDNISOLONE ACETATE 80 MG/ML IJ SUSP
80.0000 mg | Freq: Once | INTRAMUSCULAR | Status: AC
  Administered 2018-01-25: 80 mg

## 2018-01-25 NOTE — Patient Instructions (Signed)

## 2018-01-25 NOTE — Progress Notes (Signed)
 .  Numeric Pain Rating Scale and Functional Assessment Average Pain 10   In the last MONTH (on 0-10 scale) has pain interfered with the following?  1. General activity like being  able to carry out your everyday physical activities such as walking, climbing stairs, carrying groceries, or moving a chair?  Rating(6)   +Driver, -BT, -Dye Allergies.  

## 2018-02-03 NOTE — Procedures (Signed)
Lumbar Epidural Steroid Injection - Interlaminar Approach with Fluoroscopic Guidance  Patient: [name]      Date of Birth: 03-09-1946 MRN: [id number] PCP: Alroy Dust, L.Marlou Sa, MD      Visit Date: 01/25/2018   Universal Protocol:     Consent Given By: the patient  Position: PRONE  Additional Comments: Vital signs were monitored before and after the procedure. Patient was prepped and draped in the usual sterile fashion. The correct patient, procedure, and site was verified.   Injection Procedure Details:  Procedure Site One Meds Administered:  Meds ordered this encounter  Medications  . methylPREDNISolone acetate (DEPO-MEDROL) injection 80 mg     Laterality: Left  Location/Site:  L5-S1  Needle size: 20 G  Needle type: Tuohy  Needle Placement: Paramedian epidural  Findings:   -Comments: Excellent flow of contrast into the epidural space.  Procedure Details: Using a paramedian approach from the side mentioned above, the region overlying the inferior lamina was localized under fluoroscopic visualization and the soft tissues overlying this structure were infiltrated with 4 ml. of 1% Lidocaine without Epinephrine. The Tuohy needle was inserted into the epidural space using a paramedian approach.   The epidural space was localized using loss of resistance along with lateral and bi-planar fluoroscopic views.  After negative aspirate for air, blood, and CSF, a 2 ml. volume of Isovue-250 was injected into the epidural space and the flow of contrast was observed. Radiographs were obtained for documentation purposes.    The injectate was administered into the level noted above.   Additional Comments:  The patient tolerated the procedure well Dressing: Band-Aid    Post-procedure details: Patient was observed during the procedure. Post-procedure instructions were reviewed.  Patient left the clinic in stable condition.

## 2018-02-03 NOTE — Progress Notes (Signed)
ULANI DEGRASSE - 72 y.o. female MRN [id number]  Date of birth: 1946-06-25  Office Visit Note: Visit Date: 01/25/2018 PCP: Alroy Dust, L.Marlou Sa, MD Referred by: Alroy Dust, L.Marlou Sa, MD  Subjective: Chief Complaint  Patient presents with  . Lower Back - Pain  . Right Leg - Pain, Tingling  . Left Leg - Pain, Tingling   HPI: Mrs. Poblano is a 72 year old female who comes in today at the request of Dr. Marlou Sa after evaluation for low back and right radicular leg pain.  This is been a chronic ongoing problem for her for many years.  She reports exacerbation over the last month without relief using anti-inflammatories and rest and activity modification.  She does not endorse any specific injury but did have a lot of increased symptoms after increased activity and cleaning.  Initial pain was more in the right leg that she feels like is really in both legs and tingling.  MRI from 2016 only really showed annular tearing and some arthritis.  Epidural injection at the time was beneficial.  She has no other red flag complaints without focal weakness or bowel bladder difficulty or fevers or chills or night sweats.  I am going to complete a diagnostic and therapeutic left L5-S1 intralaminar epidural steroid injection.  Depending on relief she may need updated MRI and regrouping with physical therapy.   ROS Otherwise per HPI.  Assessment & Plan: Visit Diagnoses:  1. Lumbar radiculopathy     Plan: No additional findings.   Meds & Orders:  Meds ordered this encounter  Medications  . methylPREDNISolone acetate (DEPO-MEDROL) injection 80 mg    Orders Placed This Encounter  Procedures  . XR C-ARM NO REPORT  . Epidural Steroid injection    Follow-up: Return if symptoms worsen or fail to improve.   Procedures: No procedures performed  Lumbar Epidural Steroid Injection - Interlaminar Approach with Fluoroscopic Guidance  Patient: BRIHANA QUICKEL      Date of Birth: 02-Jan-1946 MRN: [id number] PCP: Alroy Dust,  L.Marlou Sa, MD      Visit Date: 01/25/2018   Universal Protocol:     Consent Given By: the patient  Position: PRONE  Additional Comments: Vital signs were monitored before and after the procedure. Patient was prepped and draped in the usual sterile fashion. The correct patient, procedure, and site was verified.   Injection Procedure Details:  Procedure Site One Meds Administered:  Meds ordered this encounter  Medications  . methylPREDNISolone acetate (DEPO-MEDROL) injection 80 mg     Laterality: Left  Location/Site:  L5-S1  Needle size: 20 G  Needle type: Tuohy  Needle Placement: Paramedian epidural  Findings:   -Comments: Excellent flow of contrast into the epidural space.  Procedure Details: Using a paramedian approach from the side mentioned above, the region overlying the inferior lamina was localized under fluoroscopic visualization and the soft tissues overlying this structure were infiltrated with 4 ml. of 1% Lidocaine without Epinephrine. The Tuohy needle was inserted into the epidural space using a paramedian approach.   The epidural space was localized using loss of resistance along with lateral and bi-planar fluoroscopic views.  After negative aspirate for air, blood, and CSF, a 2 ml. volume of Isovue-250 was injected into the epidural space and the flow of contrast was observed. Radiographs were obtained for documentation purposes.    The injectate was administered into the level noted above.   Additional Comments:  The patient tolerated the procedure well Dressing: Band-Aid    Post-procedure details: Patient was observed  during the procedure. Post-procedure instructions were reviewed.  Patient left the clinic in stable condition.   Clinical History: Lumbar spine x-ray 3/19 AP lateral lumbar spine reviewed. Slight narrowing at L4-5 is noted. No  spondylolisthesis or compression fractures present. No significant facet  joint arthritis present.    She reports that she has never smoked. She has never used smokeless tobacco. No results for input(s): HGBA1C, LABURIC in the last 8760 hours.  Objective:  VS:  HT:    WT:   BMI:     BP:134/63  HR:60bpm  TEMP:98.4 F (36.9 C)(Oral)  RESP:95 % Physical Exam  Ortho Exam Imaging: No results found.  Past Medical/Family/Surgical/Social History: Medications & Allergies reviewed per EMR, new medications updated. Patient Active Problem List   Diagnosis Date Noted  . Concussion with loss of consciousness 06/02/2014  . Left-sided weakness 05/28/2014  . Syncope and collapse 05/28/2014  . Scalp contusion 05/28/2014  . Hypertension    Past Medical History:  Diagnosis Date  . Depression   . Hypertension   . IBS (irritable bowel syndrome)   . Malignant melanoma ([place])    Family History  Problem Relation Age of Onset  . Heart attack Mother   . Hypertension Mother   . Cancer Father   . Diabetes Father   . Hypertension Father    Past Surgical History:  Procedure Laterality Date  . FOOT SURGERY Bilateral   . HERNIA REPAIR    . MELANOMA EXCISION    . VAGINAL HYSTERECTOMY     Social History   Occupational History  . Occupation: retired  Tobacco Use  . Smoking status: Never Smoker  . Smokeless tobacco: Never Used  Substance and Sexual Activity  . Alcohol use: No  . Drug use: No  . Sexual activity: Not on file

## 2018-05-01 DIAGNOSIS — R079 Chest pain, unspecified: Secondary | ICD-10-CM | POA: Diagnosis not present

## 2018-05-01 DIAGNOSIS — M940 Chondrocostal junction syndrome [Tietze]: Secondary | ICD-10-CM | POA: Diagnosis not present

## 2018-05-03 DIAGNOSIS — R079 Chest pain, unspecified: Secondary | ICD-10-CM | POA: Diagnosis not present

## 2018-05-03 DIAGNOSIS — I10 Essential (primary) hypertension: Secondary | ICD-10-CM | POA: Diagnosis not present

## 2018-05-03 DIAGNOSIS — R002 Palpitations: Secondary | ICD-10-CM | POA: Diagnosis not present

## 2018-05-10 ENCOUNTER — Other Ambulatory Visit: Payer: Self-pay | Admitting: Family Medicine

## 2018-05-10 ENCOUNTER — Ambulatory Visit (INDEPENDENT_AMBULATORY_CARE_PROVIDER_SITE_OTHER): Payer: PPO

## 2018-05-10 DIAGNOSIS — R002 Palpitations: Secondary | ICD-10-CM | POA: Diagnosis not present

## 2018-05-13 DIAGNOSIS — I493 Ventricular premature depolarization: Secondary | ICD-10-CM | POA: Diagnosis not present

## 2018-05-13 DIAGNOSIS — E039 Hypothyroidism, unspecified: Secondary | ICD-10-CM | POA: Diagnosis not present

## 2018-05-13 DIAGNOSIS — I10 Essential (primary) hypertension: Secondary | ICD-10-CM | POA: Diagnosis not present

## 2018-07-13 DIAGNOSIS — E039 Hypothyroidism, unspecified: Secondary | ICD-10-CM | POA: Diagnosis not present

## 2018-07-15 DIAGNOSIS — L570 Actinic keratosis: Secondary | ICD-10-CM | POA: Diagnosis not present

## 2018-07-15 DIAGNOSIS — L821 Other seborrheic keratosis: Secondary | ICD-10-CM | POA: Diagnosis not present

## 2018-09-20 DIAGNOSIS — E039 Hypothyroidism, unspecified: Secondary | ICD-10-CM | POA: Diagnosis not present

## 2018-12-22 ENCOUNTER — Other Ambulatory Visit: Payer: Self-pay | Admitting: Family Medicine

## 2018-12-22 DIAGNOSIS — Z1231 Encounter for screening mammogram for malignant neoplasm of breast: Secondary | ICD-10-CM

## 2018-12-28 ENCOUNTER — Telehealth: Payer: PPO | Admitting: Physician Assistant

## 2018-12-28 DIAGNOSIS — R0989 Other specified symptoms and signs involving the circulatory and respiratory systems: Secondary | ICD-10-CM

## 2018-12-28 DIAGNOSIS — R059 Cough, unspecified: Secondary | ICD-10-CM

## 2018-12-28 DIAGNOSIS — R05 Cough: Secondary | ICD-10-CM | POA: Diagnosis not present

## 2018-12-28 MED ORDER — BENZONATATE 100 MG PO CAPS: 100.0000 mg | ORAL_CAPSULE | Freq: Three times a day (TID) | ORAL | 0 refills | Status: DC

## 2018-12-28 NOTE — Progress Notes (Signed)
E visit for Flu like symptoms   We are sorry that you are not feeling well.  Here is how we plan to help! Based on what you have shared with me it looks like you may have flu-like symptoms that should be watched but do not seem to indicate anti-viral treatment.  Unfortunately Tamiflu works best if we treat you within 48 hours of illness onset.  Given that you report having the illness for greater than 2 days Tamiflu is unlikely to help you.  With regard to the abnormal lung sounds in your right upper lobe, I have some mild concern for pneumonia however you did not complain of chest pain or shortness of breath.  Most of the time the symptoms are present with pneumonia.  I recommend that she stay home and rest, drink lots of fluids, take Tylenol, and if you develop shortness of breath or chest pain please go to the emergency department or Urgent Care.  For your cough I am prescribing Tessalon Perles.  Influenza or "the flu" is   an infection caused by a respiratory virus. The flu virus is highly contagious and persons who did not receive their yearly flu vaccination may "catch" the flu from close contact.  We have anti-viral medications to treat the viruses that cause this infection. They are not a "cure" and only shorten the course of the infection. These prescriptions are most effective when they are given within the first 2 days of "flu" symptoms. Antiviral medication are indicated if you have a high risk of complications from the flu. You should  also consider an antiviral medication if you are in close contact with someone who is at risk. These medications can help patients avoid complications from the flu  but have side effects that you should know. Possible side effects from Tamiflu or oseltamivir include nausea, vomiting, diarrhea, dizziness, headaches, eye redness, sleep problems or other respiratory symptoms. You should not take Tamiflu if you have an allergy to oseltamivir or any to the ingredients  in Tamiflu.   ANYONE WHO HAS FLU SYMPTOMS SHOULD: . Stay home. The flu is highly contagious and going out or to work exposes others! . Be sure to drink plenty of fluids. Water is fine as well as fruit juices, sodas and electrolyte beverages. You may want to stay away from caffeine or alcohol. If you are nauseated, try taking small sips of liquids. How do you know if you are getting enough fluid? Your urine should be a pale yellow or almost colorless. . Get rest. . Taking a steamy shower or using a humidifier may help nasal congestion and ease sore throat pain. Using a saline nasal spray works much the same way. . Cough drops, hard candies and sore throat lozenges may ease your cough. . Line up a caregiver. Have someone check on you regularly.   GET HELP RIGHT AWAY IF: . You cannot keep down liquids or your medications. . You become short of breath . Your fell like you are going to pass out or loose consciousness. . Your symptoms persist after you have completed your treatment plan MAKE SURE YOU   Understand these instructions.  Will watch your condition.  Will get help right away if you are not doing well or get worse.  Your e-visit answers were reviewed by a board certified advanced clinical practitioner to complete your personal care plan.  Depending on the condition, your plan could have included both over the counter or prescription medications.  If  there is a problem please reply  once you have received a response from your provider.  Your safety is important to [place].  If you have drug allergies check your prescription carefully.    You can use MyChart to ask questions about today's visit, request a non-urgent call back, or ask for a work or school excuse for 24 hours related to this e-Visit. If it has been greater than 24 hours you will need to follow up with your provider, or enter a new e-Visit to address those concerns.  You will get an e-mail in the next two days asking about  your experience.  I hope that your e-visit has been valuable and will speed your recovery. Thank you for using e-visits.  A total of 5-10 minutes was spent evaluating this patients questionnaire and formulating a plan of care.

## 2019-01-21 ENCOUNTER — Ambulatory Visit: Payer: Self-pay

## 2019-02-04 DIAGNOSIS — J45909 Unspecified asthma, uncomplicated: Secondary | ICD-10-CM | POA: Diagnosis not present

## 2019-02-18 DIAGNOSIS — E039 Hypothyroidism, unspecified: Secondary | ICD-10-CM | POA: Diagnosis not present

## 2019-02-18 DIAGNOSIS — M545 Low back pain: Secondary | ICD-10-CM | POA: Diagnosis not present

## 2019-02-18 DIAGNOSIS — G4700 Insomnia, unspecified: Secondary | ICD-10-CM | POA: Diagnosis not present

## 2019-02-18 DIAGNOSIS — J309 Allergic rhinitis, unspecified: Secondary | ICD-10-CM | POA: Diagnosis not present

## 2019-02-18 DIAGNOSIS — R159 Full incontinence of feces: Secondary | ICD-10-CM | POA: Diagnosis not present

## 2019-02-18 DIAGNOSIS — Z0000 Encounter for general adult medical examination without abnormal findings: Secondary | ICD-10-CM | POA: Diagnosis not present

## 2019-02-18 DIAGNOSIS — E782 Mixed hyperlipidemia: Secondary | ICD-10-CM | POA: Diagnosis not present

## 2019-02-18 DIAGNOSIS — I10 Essential (primary) hypertension: Secondary | ICD-10-CM | POA: Diagnosis not present

## 2019-02-18 DIAGNOSIS — F411 Generalized anxiety disorder: Secondary | ICD-10-CM | POA: Diagnosis not present

## 2019-02-25 DIAGNOSIS — I10 Essential (primary) hypertension: Secondary | ICD-10-CM | POA: Diagnosis not present

## 2019-02-25 DIAGNOSIS — E782 Mixed hyperlipidemia: Secondary | ICD-10-CM | POA: Diagnosis not present

## 2019-02-25 DIAGNOSIS — E039 Hypothyroidism, unspecified: Secondary | ICD-10-CM | POA: Diagnosis not present

## 2019-04-05 ENCOUNTER — Other Ambulatory Visit: Payer: Self-pay

## 2019-04-05 ENCOUNTER — Emergency Department (HOSPITAL_COMMUNITY)
Admission: EM | Admit: 2019-04-05 | Discharge: 2019-04-05 | Disposition: A | Discharge status: Home / Self Care | Payer: PPO | Attending: Emergency Medicine | Admitting: Emergency Medicine

## 2019-04-05 ENCOUNTER — Emergency Department (HOSPITAL_COMMUNITY): Payer: PPO

## 2019-04-05 DIAGNOSIS — R079 Chest pain, unspecified: Secondary | ICD-10-CM | POA: Diagnosis not present

## 2019-04-05 DIAGNOSIS — Z79899 Other long term (current) drug therapy: Secondary | ICD-10-CM | POA: Insufficient documentation

## 2019-04-05 DIAGNOSIS — R0789 Other chest pain: Secondary | ICD-10-CM | POA: Insufficient documentation

## 2019-04-05 DIAGNOSIS — Z7982 Long term (current) use of aspirin: Secondary | ICD-10-CM | POA: Insufficient documentation

## 2019-04-05 DIAGNOSIS — I10 Essential (primary) hypertension: Secondary | ICD-10-CM | POA: Insufficient documentation

## 2019-04-05 DIAGNOSIS — R202 Paresthesia of skin: Secondary | ICD-10-CM | POA: Diagnosis not present

## 2019-04-05 DIAGNOSIS — R001 Bradycardia, unspecified: Secondary | ICD-10-CM | POA: Diagnosis not present

## 2019-04-05 DIAGNOSIS — R0902 Hypoxemia: Secondary | ICD-10-CM | POA: Diagnosis not present

## 2019-04-05 LAB — BASIC METABOLIC PANEL WITH GFR
Anion gap: 8 (ref 5–15)
BUN: 13 mg/dL (ref 8–23)
CO2: 27 mmol/L (ref 22–32)
Calcium: 8.8 mg/dL — ABNORMAL LOW (ref 8.9–10.3)
Chloride: 106 mmol/L (ref 98–111)
Creatinine, Ser: 1.04 mg/dL — ABNORMAL HIGH (ref 0.44–1.00)
GFR calc Af Amer: >60 mL/min
GFR calc non Af Amer: 53 mL/min — ABNORMAL LOW
Glucose, Bld: 103 mg/dL — ABNORMAL HIGH (ref 70–99)
Potassium: 3.8 mmol/L (ref 3.5–5.1)
Sodium: 141 mmol/L (ref 135–145)

## 2019-04-05 LAB — CBC
HCT: 44.1 % (ref 36.0–46.0)
Hemoglobin: 14.5 g/dL (ref 12.0–15.0)
MCH: 30.7 pg (ref 26.0–34.0)
MCHC: 32.9 g/dL (ref 30.0–36.0)
MCV: 93.2 fL (ref 80.0–100.0)
Platelets: 229 10*3/uL (ref 150–400)
RBC: 4.73 MIL/uL (ref 3.87–5.11)
RDW: 12.4 % (ref 11.5–15.5)
WBC: 8.8 10*3/uL (ref 4.0–10.5)
nRBC: 0 % (ref 0.0–0.2)

## 2019-04-05 LAB — TROPONIN I (HIGH SENSITIVITY)
Troponin I (High Sensitivity): 3 ng/L (ref <18)
Troponin I (High Sensitivity): 3 ng/L (ref <18)

## 2019-04-05 MED ORDER — SODIUM CHLORIDE 0.9% FLUSH: 3.0000 mL | Freq: Once | INTRAVENOUS | Status: DC

## 2019-04-05 MED ORDER — ACETAMINOPHEN 325 MG PO TABS
650.0000 mg | ORAL_TABLET | Freq: Once | ORAL | Status: AC
  Administered 2019-04-05: 650 mg via ORAL

## 2019-04-05 NOTE — Discharge Instructions (Addendum)
You were evaluated in the emergency department for chest pain.    Work up today was reassuring.  The cause of your pain is still unclear but based on your symptoms, work and risks cardiac ischemia (decreased blood flow) is unlikely.  You have moderate risk factors and we offered you hospital admission for cardiac work up but you felt comfortable with discharge and close follow up with cardiology as soon as possible.   Call cardiology as soon as possible to establish care and further discussion and work up of your symptoms on an outpatient setting  Please return to ED if: You have chest pain on exertion or activity You have a cough that gets worse, or you cough up blood. You have severe pain in chest, back or abdomen. You have chest pain or shortness of breath with exertion or activity You have sudden, unexplained discomfort in your chest, with radiation arms, back, neck, or jaw. You suddenly have chest pain and begin to sweat, or your skin gets clammy. You feel chest pain with nausea or vomiting. You suddenly feel light-headed or faint. Your heart begins to beat quickly, or it feels like it is skipping beats. You have one sided leg swelling or calf pain

## 2019-04-05 NOTE — ED Notes (Signed)
Pt discharged with all belongings. Discharge instructions reviewed with pt, and pt verbalized understanding. Opportunity for questions provided.  

## 2019-04-05 NOTE — ED Triage Notes (Signed)
Pt arrives by ems with c/o of chest pain that started suddenly while on the phone with [employer]. Pt states pain went across her chest into her right jaw.

## 2019-04-05 NOTE — ED Provider Notes (Signed)
[facility]ER[facility] DEPARTMENT Provider Note   CSN: [id number] Arrival date & time: 04/05/19  1338    History   Chief Complaint Chief Complaint  Patient presents with   Chest Pain    HPI [name] is a 73 y.o. female with history of IBS, HTN, HLD, depression, malignant melanoma is here for evaluation of chest pain.  Described as "an electric shock" diffuse to the front of her chest with radiation to the right neck and chin.  It lasted approximately 1-2 minutes.  There was associated nausea, shortness of breath "I had to take deeper breaths", tingling to her right hand.  Her thoracic back between shoulder blades and shoulders now feel "tight".  Currently reports 5/10 chest "tightness".  She did several hours of yard work this morning.  It occurred while she was sitting down on the phone with [employer].  States that she was able to continue the conversation with some difficulty due to the pain, eventually be stopped.  She chewed 3 aspirin and was given 650 mg of Tylenol in the ER. States she is very active and used to doing heavy yard work frequently, she has never had CP with exertion.   She denies any associated lightheadedness, palpitations, syncope, vomiting, diaphoresis.  No recent illnesses.  No history of DVT/PE, hormone therapy, recent surgery, prolonged immobilization, pleuritic chest pain, hemoptysis, leg swelling or calf pain.  Her mother had several heart attacks, unknown age.  Her father had a heart attack at age 55.  She has no personal history of CAD.     HPI  Past Medical History:  Diagnosis Date   Depression    Hypertension    IBS (irritable bowel syndrome)    Malignant melanoma ([place])     Patient Active Problem List   Diagnosis Date Noted   Concussion with loss of consciousness 06/02/2014   Left-sided weakness 05/28/2014   Syncope and collapse 05/28/2014   Scalp contusion 05/28/2014   Hypertension     Past Surgical History:    Procedure Laterality Date   FOOT SURGERY Bilateral    HERNIA REPAIR     MELANOMA EXCISION     VAGINAL HYSTERECTOMY       OB History   No obstetric history on file.      Home Medications    Prior to Admission medications   Medication Sig Start Date End Date Taking? Authorizing Provider  Acetaminophen (TYLENOL PO) Take 2 tablets by mouth daily as needed (for pain or headache).    [provider]  ALPRAZolam Duanne Moron) 1 MG tablet Take 2 mg by mouth at bedtime. 05/16/14   [provider]  aspirin EC 81 MG tablet Take 81 mg by mouth at bedtime.    [provider]  benzonatate (TESSALON) 100 MG capsule Take 1-2 capsules (100-200 mg total) by mouth 3 (three) times daily. 12/28/18   Tereasa Coop, PA-C  clindamycin (CLEOCIN) 300 MG capsule Take 1 capsule (300 mg total) by mouth 3 (three) times daily. 06/20/14   Junius Creamer, NP  conjugated estrogens (PREMARIN) vaginal cream Place 1 Applicatorful vaginally once a week.    [provider]  cyclobenzaprine (FLEXERIL) 10 MG tablet 1 po bid prn spasms 01/21/18   Meredith Pel, MD  hydrochlorothiazide (HYDRODIURIL) 25 MG tablet Take 25 mg by mouth daily. 05/11/14   [provider]  hydrocortisone valerate ointment (WESTCORT) 0.2 % Apply 1 application topically 2 (two) times daily. 08/05/15   Andrena Mews  T, MD  HYDROmorphone (DILAUDID) 2 MG tablet Take 0.5 tablets (1 mg total) by mouth every 12 (twelve) hours as needed for severe pain. 01/21/18   Meredith Pel, MD  ibuprofen (ADVIL,MOTRIN) 200 MG tablet Take 3 tablets (600 mg total) by mouth every 4 (four) hours as needed for fever, headache or mild pain. 05/30/14   Debbe Odea, MD  Ketotifen Fumarate (ALAWAY OP) Place 5 drops into both eyes daily as needed (for allergies).    [provider]  meloxicam (MOBIC) 7.5 MG tablet 1 po bid x 3 weeks 01/06/18   Meredith Pel, MD  metoprolol tartrate (LOPRESSOR) 25 MG tablet Take 25 mg  by mouth 2 (two) times daily. 05/11/14   [provider]  oxyCODONE-acetaminophen (PERCOCET) 10-325 MG per tablet Take 1 tablet by mouth every 6 (six) hours as needed for pain. 06/02/14   Marcial Pacas, MD  promethazine (PHENERGAN) 12.5 MG tablet Take 1 tablet (12.5 mg total) by mouth every 8 (eight) hours as needed for nausea or vomiting. 08/05/15   Kinnie Feil, MD  promethazine (PHENERGAN) 25 MG tablet Take 1 tablet (25 mg total) by mouth every 8 (eight) hours as needed for nausea or vomiting. 01/06/18   Meredith Pel, MD  sertraline (ZOLOFT) 50 MG tablet Take 50 mg by mouth daily. 05/06/14   [provider]  simvastatin (ZOCOR) 40 MG tablet Take 40 mg by mouth at bedtime. 03/31/14   [provider]  triamcinolone cream (KENALOG) 0.5 % Apply 1 application topically as needed (for skin irritation).  06/06/14   [provider]    Family History Family History  Problem Relation Age of Onset   Heart attack Mother    Hypertension Mother    Cancer Father    Diabetes Father    Hypertension Father     Social History Social History   Tobacco Use   Smoking status: Never Smoker   Smokeless tobacco: Never Used  Substance Use Topics   Alcohol use: No   Drug use: No     Allergies   Codeine, Penicillins, Flagyl [metronidazole], and Sulfa antibiotics   Review of Systems Review of Systems  Respiratory: Positive for chest tightness and shortness of breath.   Cardiovascular: Positive for chest pain.  Gastrointestinal: Positive for nausea.  Musculoskeletal: Positive for neck pain (right neck and chin/jaw pain).  Neurological:       Paresthesias to right hand   All other systems reviewed and are negative.    Physical Exam Updated Vital Signs BP (!) 153/103 (BP Location: Right Arm)    Pulse (!) 50    Temp 98 F (36.7 C) (Oral)    Resp 18    SpO2 98%   Physical Exam Constitutional:      Appearance: She is well-developed.     Comments:  NAD. Non toxic.   HENT:     Head: Normocephalic and atraumatic.     Nose: Nose normal.  Eyes:     General: Lids are normal.     Conjunctiva/sclera: Conjunctivae normal.  Neck:     Musculoskeletal: Normal range of motion.     Trachea: Trachea normal.     Comments: Trachea midline.  Cardiovascular:     Rate and Rhythm: Normal rate and regular rhythm.     Pulses:          Radial pulses are 1+ on the right side and 1+ on the left side.       Dorsalis pedis  pulses are 1+ on the right side and 1+ on the left side.     Heart sounds: Normal heart sounds, S1 normal and S2 normal.     Comments: No LE edema or calf tenderness.  Pulmonary:     Effort: Pulmonary effort is normal.     Breath sounds: Normal breath sounds.     Comments: Mild central/right sided chest wall tenderness. No reproducible chest pain with active ROM of upper extremities.  Abdominal:     General: Bowel sounds are normal.     Palpations: Abdomen is soft.     Tenderness: There is no abdominal tenderness.     Comments: No epigastric tenderness. No distention.   Skin:    General: Skin is warm and dry.     Capillary Refill: Capillary refill takes less than 2 seconds.     Comments: No rash to chest wall  Neurological:     Mental Status: She is alert.     GCS: GCS eye subscore is 4. GCS verbal subscore is 5. GCS motor subscore is 6.     Comments: Sensation and strength intact in upper and lower extremities.   Psychiatric:        Speech: Speech normal.        Behavior: Behavior normal.        Thought Content: Thought content normal.      ED Treatments / Results  Labs (all labs ordered are listed, but only abnormal results are displayed) Labs Reviewed  BASIC METABOLIC PANEL - Abnormal; Notable for the following components:      Result Value   Glucose, Bld 103 (*)    Creatinine, Ser 1.04 (*)    Calcium 8.8 (*)    GFR calc non Af Amer 53 (*)    All other components within normal limits  CBC  TROPONIN I (HIGH  SENSITIVITY)  TROPONIN I (HIGH SENSITIVITY)    EKG EKG Interpretation  Date/Time:  Tuesday April 05 2019 13:56:20 EDT Ventricular Rate:  54 PR Interval:  178 QRS Duration: 82 QT Interval:  468 QTC Calculation: 443 R Axis:   15 Text Interpretation:  Sinus bradycardia Minimal voltage criteria for LVH, may be normal variant Borderline ECG since last tracing no significant change Confirmed by Malvin Johns [phone]) on 04/05/2019 3:06:57 PM   Radiology Dg Chest 2 View  Result Date: 04/05/2019 CLINICAL DATA:  Chest pain. EXAM: CHEST - 2 VIEW COMPARISON:  Radiographs of May 24, 2009. FINDINGS: The heart size and mediastinal contours are within normal limits. Both lungs are clear. No pneumothorax or pleural effusion is noted. The visualized skeletal structures are unremarkable. IMPRESSION: No active cardiopulmonary disease. Electronically Signed   By: Marijo Conception M.D.   On: 04/05/2019 14:22    Procedures Procedures (including critical care time)  Medications Ordered in ED Medications  sodium chloride flush (NS) 0.9 % injection 3 mL (has no administration in time range)  acetaminophen (TYLENOL) tablet 650 mg (650 mg Oral Given 04/05/19 1447)     Initial Impression / Assessment and Plan / ED Course  I have reviewed the triage vital signs and the nursing notes.  Pertinent labs & imaging results that were available during my care of the patient were reviewed by me and considered in my medical decision making (see chart for details).  Clinical Course as of Apr 04 1726  Tue Apr 05, 2019  1617 Sinus bradycardia HR 54 Minimal voltage criteria for LVH, may be normal variant Borderline ECG since  last tracing no significant change Confirmed by Malvin Johns [phone]) on 04/05/2019 3:06:57 PM  ED EKG within 10 minutes [CG]  1617 Troponin I (High Sensitivity): 3 [CG]  1655 Troponin I (High Sensitivity): 3 [CG]    Clinical Course User Index [CG] Kinnie Feil, PA-C      Pt is a  73 y.o. female presents with CP. CP onset at 1 pm while sitting down. CP has atypical and typical features.  She did several hours of yard work and had no CP, CP began while sitting down on the phone. She has never had exertional CP in the past and she is active.  Some chest tightness in ER.  Symptoms described as "electrick shock" for 2 min, non exertional, non pleuritic, non positional. No associated concerning features such as fever, cough, SOB. No recent illnesses. No palpitations, light-headedness, syncope, pleuritic pain, leg swelling/calf pain to suggest DVT.  Cardiac risk factors include HTN, HLD, elevated BMI, positive family hx.  Echo in 2015 normal.   VS WNL and stable. CV and pulmonary exam benign. There is reproducible CP with palpation.  No LE edema or calf tenderness. No epigastric tenderness. No neuro or pulse deficits.   Work up benign. EKG non-ischemic.  Hs-trop 3 and 3. No risk factors for PE and Wells Score= 0, no tachycardia, tachypnea, hypoxemia or pleuritic CP to suggest PE. Heart score is 5.    HEART pathway implemented during MDM.  Pt meets criteria for obs/admission vs close Op follow up.  This was discussed with patient at length. She understands is moderate risk for ACS. Patient was offered hospital admission but ultimately she felt comfortable with close cardiology f/u.  I offered to call her daughter who is an Therapist, sports but patient declined.    Work up not suggestive of symptomatic anemia, PE, PTX, dissection, ACS. ED return preacutions given. Pt appears reliable for follow up, aware of symptoms that would warrant return to ER.  Pt is comfortable and agreeable with ER POC and discharge plan. Shared with EDP.  Final Clinical Impressions(s) / ED Diagnoses   Final diagnoses:  Atypical chest pain    ED Discharge Orders    None       Arlean Hopping 04/05/19 1727    Malvin Johns, MD 04/05/19 [phone]

## 2019-04-11 ENCOUNTER — Telehealth: Payer: Self-pay | Admitting: Pharmacist

## 2019-04-11 NOTE — Patient Outreach (Signed)
[facility]) Care Management  [name]   04/11/2019  [name] 04-27-1946 [id number]  Reason for referral: medication assistance  Referral source: [facility] Referral medication(s): Xopenex and Premarin Current insurance:HTA  HPI:  Hypertension, status post concussion with loss of memory, left sided weakness, and nausea.  Objective: Allergies  Allergen Reactions  . Codeine Nausea And Vomiting  . Penicillins Swelling  . Flagyl [Metronidazole] Hives  . Sulfa Antibiotics Nausea And Vomiting    Medications Reviewed Today    Reviewed by Elayne Guerin, [facility] (Pharmacist) on 04/11/19 at [place] List Status: <None>  Medication Order Taking? Sig Documenting Provider Last Dose Status Informant  Acetaminophen (TYLENOL PO) [id number] Yes Take 2 tablets by mouth daily as needed (for pain or headache). [provider] Taking Active Self  ALPRAZolam Duanne Moron) 1 MG tablet [id number] Yes Take 2 mg by mouth at bedtime. [provider] Taking Active Self           Med Note Duffy Bruce, Legrand Como   Tue Apr 05, 2019  3:10 PM)    aspirin EC 81 MG tablet [id number] Yes Take 81 mg by mouth at bedtime. [provider] Taking Active Self  atorvastatin (LIPITOR) 40 MG tablet [id number] Yes TAKE 1 TABLET BY MOUTH ONCE A DAY ON MON WED FRI [provider] Taking Active   benzonatate (TESSALON) 100 MG capsule [id number] Yes Take 1-2 capsules (100-200 mg total) by mouth 3 (three) times daily. Tereasa Coop, PA-C Taking Active   conjugated estrogens (PREMARIN) vaginal cream [id number] Yes Place 1 Applicatorful vaginally once a week. [provider] Taking Active Self  cyclobenzaprine (FLEXERIL) 5 MG tablet [id number] Yes TAKE 1 TABLET THREE TIMES A DAY AS NEEDED FOR SPASM [provider] Taking Active   hydrochlorothiazide (HYDRODIURIL) 25 MG tablet [id number] Yes Take 25 mg by mouth daily. [provider] Taking Active Self            Med Note Duffy Bruce, Legrand Como   Tue Apr 05, 2019  3:10 PM)    hydrocortisone valerate ointment (WESTCORT) 0.2 % [id number] Yes Apply 1 application topically 2 (two) times daily. Kinnie Feil, MD Taking Active   HYDROmorphone (DILAUDID) 2 MG tablet [id number] Yes Take 0.5 tablets (1 mg total) by mouth every 12 (twelve) hours as needed for severe pain. Meredith Pel, MD Taking Active   ibuprofen (ADVIL,MOTRIN) 200 MG tablet [id number] Yes Take 3 tablets (600 mg total) by mouth every 4 (four) hours as needed for fever, headache or mild pain. Debbe Odea, MD Taking Active Self  Ketotifen Fumarate (ALAWAY OP) [id number]  Place 5 drops into both eyes daily as needed (for allergies). [provider]  Active Self  levothyroxine (SYNTHROID) 50 MCG tablet [id number] Yes TAKE 1 TABLET BY MOUTH EVERY DAY IN THE MORNING ON EMPTY STOMACH [provider] Taking Active   meloxicam (MOBIC) 15 MG tablet [id number] Yes TAKE 1 TABLET BY MOUTH AS NEEDED FOR PAIN [provider] Taking Active   metoprolol tartrate (LOPRESSOR) 50 MG tablet [id number] Yes Take 50 mg by mouth 2 (two) times daily.  [provider] Taking Active Self           Med Note Duffy Bruce, Legrand Como   Tue Apr 05, 2019  3:10 PM)    montelukast (SINGULAIR) 10 MG tablet [id number] Yes Take 1 tablet by mouth daily. [provider] Taking Active        Patient not taking:  Discontinued 04/11/19 1836 (Completed Course)   PROAIR HFA 108 (90 Base) MCG/ACT inhaler [id number]  Inhale 2 puffs into the lungs every 4 (four) hours as needed. [provider]  Active   promethazine (PHENERGAN) 25 MG tablet [id number] Yes Take 1 tablet (25 mg total) by mouth every 8 (eight) hours as needed for nausea or vomiting. Meredith Pel, MD Taking Active   triamcinolone cream (KENALOG) 0.5 % [id number]  Apply 1 application topically as needed (for skin irritation).  [provider]  Active Self           Med Note  Duffy Bruce, Legrand Como   Tue Apr 05, 2019  3:10 PM)    zolpidem (AMBIEN) 10 MG tablet [id number]  TAKE 1 TABLET BY MOUTH EVERY DAY AS NEEDED FOR SLEEP [provider]  Active           Assessment:  Drugs sorted by system:  Neurologic/Psychologic: Alprazolam, Zolipidem  Cardiovascular: Aspirin, Atorvastatin, HCTZ, Metoprolol,   Pulmonary/Allergy: Benzonatate, Albuterol HFA, Montelukast      Gastrointestinal: Promethazine,   Endocrine: Levothyroxine  Topical: Hydrocortisone valerate ointment, Triamcinolone cream  Pain: Cyclobenzaprine, Ibuprofen, Meloxicam, Hydromorphone, Oxycodone/Acetaminophen  Genitourinary: Premarin Vaginal Cream  Miscellaneous: Ketoprofen Fumarate Eye drops  Medication Review Findings:  Increased risk of CNS depression-Zolpidem, Alprazolam, Hydromorphone  Medication Assistance Findings:  Medication assistance needs identified: Albuterol HFA--Proventil HFA Merck Patient Assistance Program.     Premarin Vaginal Cream-Pfizer Patient Assistance Program  Patient appears to meet financial criteria for both programs. She is over income for LIS/Extra Help.  Additional medication assistance options reviewed with patient as warranted:  No other options identified  Plan: I will route patient assistance letter to [place] technician who will coordinate patient assistance program application process for medications listed above.  [facility] pharmacy technician will assist with obtaining all required documents from both patient and provider(s) and submit application(s) once completed.    Follow up in 6-8 weeks.   Elayne Guerin, PharmD, [place] Clinical Pharmacist [phone]

## 2019-04-12 ENCOUNTER — Other Ambulatory Visit: Payer: Self-pay | Admitting: Pharmacy Technician

## 2019-04-12 DIAGNOSIS — Z9889 Other specified postprocedural states: Secondary | ICD-10-CM | POA: Diagnosis not present

## 2019-04-12 DIAGNOSIS — H2589 Other age-related cataract: Secondary | ICD-10-CM | POA: Diagnosis not present

## 2019-04-12 DIAGNOSIS — H04123 Dry eye syndrome of bilateral lacrimal glands: Secondary | ICD-10-CM | POA: Diagnosis not present

## 2019-04-12 DIAGNOSIS — H40013 Open angle with borderline findings, low risk, bilateral: Secondary | ICD-10-CM | POA: Diagnosis not present

## 2019-04-12 DIAGNOSIS — H2513 Age-related nuclear cataract, bilateral: Secondary | ICD-10-CM | POA: Diagnosis not present

## 2019-04-12 DIAGNOSIS — H5703 Miosis: Secondary | ICD-10-CM | POA: Diagnosis not present

## 2019-04-12 NOTE — Patient Outreach (Signed)
[facility]) Care Management  04/12/2019  [name] January 25, 1946 [id number]                           Medication Assistance Referral  Referral From: [place]  Medication/Company: Premarin Vaginal cream / Pfizer Patient application portion: Faxed  Provider application portion: Faxed  to Dr. Donavan Burnet  Medication/Company: Proventil HFA / Merck Patient application portion:  Mailed Provider application portion: Mailed to Dr. Donavan Burnet    Follow up:  Will follow up with patient in 5-10 business days to confirm application(s) have been received.  [name] P. [name], [place] Management [phone]

## 2019-04-20 ENCOUNTER — Other Ambulatory Visit: Payer: Self-pay

## 2019-04-20 ENCOUNTER — Ambulatory Visit
Admission: RE | Admit: 2019-04-20 | Discharge: 2019-04-20 | Disposition: A | Discharge status: Home / Self Care | Payer: PPO | Source: Ambulatory Visit | Attending: Family Medicine | Admitting: Family Medicine

## 2019-04-20 DIAGNOSIS — Z1231 Encounter for screening mammogram for malignant neoplasm of breast: Secondary | ICD-10-CM | POA: Diagnosis not present

## 2019-04-25 ENCOUNTER — Other Ambulatory Visit: Payer: Self-pay | Admitting: Pharmacy Technician

## 2019-04-25 NOTE — Patient Outreach (Signed)
[facility]) Care Management  04/25/2019  [name] 03-Jan-1946 [id number]   Unsuccessful outreach call placed to patient in regards to [place] application for Premarin Vaginall Cream and Merck application for [employer].  Unfortunately, patient did not answer the phone, HIPAA compliant voicemail left.  Was calling patient to inquire if she has received and mailed back the patient assistance applications.  Will followup with patient in 3-5 business days if call is not returned.  Maralee Higuchi P. Patton Swisher, [place] Management [phone]

## 2019-04-25 NOTE — Patient Outreach (Signed)
[facility]) Care Management  04/25/2019  [name] [birth year] [id number]   ADDENDUM  Incoming call received from patient, HIPAA identifiers verified.  Patient was returning my call.  Patient informed she had received and had mailed back the application with [place] for Premarin Vaginal Cream and with Merck for Proventil. She informed she mailed back the July 4 weekend.  Will followup with patient in10-14 business days if application has not been received.  Ghali Morissette P. Koralynn Greenspan, [place] Management [phone]

## 2019-04-26 ENCOUNTER — Other Ambulatory Visit: Payer: Self-pay | Admitting: Pharmacy Technician

## 2019-04-26 NOTE — Patient Outreach (Signed)
[facility]) Care Management  04/26/2019  LAIYAH EXLINE 1946-05-06 [id number]    Received all necessary paperwork and signautres from both patient and provider for [place] patient assistance for Premarin Vaginal Cream.  Submitted completed application via fax.  Will followup with [employer]izer in 2-5 business days.  Balin Vandegrift P. Mireyah Chervenak, [place] Management [phone]

## 2019-04-29 ENCOUNTER — Other Ambulatory Visit: Payer: Self-pay | Admitting: Pharmacy Technician

## 2019-04-29 NOTE — Patient Outreach (Signed)
[facility]) Care Management  04/29/2019  KARLISA GAUBERT March 31, 1946 [id number]  Care coordination call placed to [place] in regards to patient's application for Premarin Vaginal Cream.  Spoke to Hilda Blades who informed the proof of income that was submitted was invalid as it is a PDF and they need a copy of the actual 1099 or the 1040 not a summary page.  Hilda Blades informed when we re submit the proof of income to include the patient's ID which is [id number].  Will followup with patient.  Waymon Laser P. Sarp Vernier, [place] Management [phone]

## 2019-04-29 NOTE — Patient Outreach (Signed)
[facility]) Care Management  04/29/2019  [name] 1946/09/25 [id number]   ADDENDUM  Successful outreach call placed to patient in regards to [name] for Premarin Cream.  Spoke to patient, HIPAA identifiers verified.  Informed patient that the proof of income she submitted was not acceptable to the PAP. Informed patient that they needed the actual copy of the 1099 forms that were submitted of the first 2 pages of the actual return.  Patient inquired if an envelope could be sent to her so that she can return these documents.  Will mail patient envelope and followup as previously scheduled.  Deandre Brannan P. Eurika Sandy, [place] Management [phone]

## 2019-05-02 DIAGNOSIS — H2512 Age-related nuclear cataract, left eye: Secondary | ICD-10-CM | POA: Diagnosis not present

## 2019-05-03 ENCOUNTER — Other Ambulatory Visit: Payer: Self-pay | Admitting: Pharmacy Technician

## 2019-05-03 NOTE — Patient Outreach (Signed)
[facility]) Care Management  05/03/2019  JELICIA NANTZ 06-29-1946 [id number]    Received all necessary documents and signatures for [employer]ck patient assistance for Proventil HFA.  Submitted completed application via mail.  Will followup with [employer]ck in 10-14 business days.  Kert Shackett P. Maple Odaniel, [place] Management [phone]

## 2019-05-11 DIAGNOSIS — H2511 Age-related nuclear cataract, right eye: Secondary | ICD-10-CM | POA: Diagnosis not present

## 2019-05-11 DIAGNOSIS — Z961 Presence of intraocular lens: Secondary | ICD-10-CM | POA: Diagnosis not present

## 2019-05-12 ENCOUNTER — Other Ambulatory Visit: Payer: Self-pay | Admitting: Pharmacy Technician

## 2019-05-12 NOTE — Patient Outreach (Signed)
[facility]) Care Management  05/12/2019  [name] 1946/02/13 [id number]    Successful outreach call placed to patient in regards to [place] application for Premarin Vaginal Cream and [employer]ck application for Proventil HFA.  Spoke to patient, HIAPA identifiers verified.  Patient informed she had receive the envelope and had mailed back her Proof of income this week.  Informed patient that [employer]ck would be mailing her a letter with an attestation form in the next few weeks. Discussed the form with the patient and what was needed to be sent back to them. Patient verbalized understanding. Also informed patient to outreach me if she had questions about the form. Patient again verbalized understanding.  Will followup with patient in 10-14 business days if the financial information has not been received.  Chaselynn Kepple P. Cormac Wint, [place] Management [phone]

## 2019-05-16 DIAGNOSIS — H2511 Age-related nuclear cataract, right eye: Secondary | ICD-10-CM | POA: Diagnosis not present

## 2019-05-17 ENCOUNTER — Other Ambulatory Visit: Payer: Self-pay | Admitting: Pharmacy Technician

## 2019-05-17 NOTE — Patient Outreach (Signed)
[facility]) Care Management  05/17/2019  SAGAL GAYTON [birth year] [id number]    Received updated income information for [place] patient assistance for Premarin Vaginal Cream.  Submitted update information to [place] per their request.  Will followup with [place] in 2-3 business days.  Anjannette Gauger P. Tharon Kitch, [place] Management [phone]

## 2019-05-20 ENCOUNTER — Other Ambulatory Visit: Payer: Self-pay | Admitting: Pharmacy Technician

## 2019-05-20 NOTE — Patient Outreach (Signed)
[name]   Addendum  05/20/2019  [name] 07/18/1946 [id number]   Successful outreach call placed to patient in regards to [place] application for [place] for Premarin Vaginal Cream.  Spoke to patient, HIPAA identifiers verified.  Informed patient that the attestation form/letter from [place] was mailed to her on 05/13/2019. Discussed with patient the form and she verbalized understanding. Confirmed she had number if she had any questions about the form.  Also informed patient that she had been approved with [place] for Premarin Vaginal Cream and would notify patient when the medication is due to arrive at the provider's office.Informed patient that refills have to be placed by the provider to [place] and that her next refill could be placed 07/25/2019.  Patient verbalized understanding.  Will followup with [place] in 5-15 business days.  Kymari Lollis P. Jerrianne Hartin, [place] Management [phone]

## 2019-05-20 NOTE — Patient Outreach (Signed)
[place] [facility]) Care Management  05/20/2019  [name] 14-Nov-1945 [id number]   Care coordination call placed to [employer] patient assistance in regards to patient's application for  Proventil HFA.  Spoke to [place] who informed they had received the application and had subsequently mailed out the attestation form on 05/13/2019.  Will followup with patient.  Care coordination call placed to [place] patient assistance in regards to patient's application for Premarin Vaginal Cream.  Spoke to Hilda Blades who informed patient APPROVED technically on 04/27/2019-10/13/2019 but 1st shipment was not made until 05/18/2019. She informed 3 tubes would be sent to the provider's office via [place]. She had no tracking information as of yet but the order number is [id number]. She informed updates would be available on the IVR when you call into [place]. She also informed the provider has to call in for the refills. She informed the next refill date would be 07/25/2019.  Will followup with [employer] in 3-5 days to inquire about tracking.  [name] P. Trew Sunde, [place] Management [phone]

## 2019-05-23 ENCOUNTER — Ambulatory Visit: Payer: Self-pay | Admitting: Pharmacist

## 2019-05-27 ENCOUNTER — Other Ambulatory Visit: Payer: Self-pay | Admitting: Pharmacy Technician

## 2019-05-27 NOTE — Patient Outreach (Signed)
[facility]) Care Management  05/27/2019  [name] April 01, 1946 [id number]   Unsuccessful outreach call placed to patient in regards to [place] application for Premarin vaginal cream and [employer] application for [employer].  Unfortunately patient did not answer the phone, HIPAA compliant voicemail left.  Was calling patient to inquire if she had mailed back the [employer] form and to update her on the shipment of Premarin Vaginal cream with [employer].  Upon using the [place] the Premarin was shipped on 05/25/2019 via UPS and should arrive at the provider's office on 05/30/2019. The trackin number was [phone]. The next refill dates is 07/25/2019.  Will followup with patient in 5-7 business days if call is not returned.  Jaxzen Vanhorn P. Kymoni Lesperance, [place] Management [phone]

## 2019-06-01 ENCOUNTER — Other Ambulatory Visit: Payer: Self-pay | Admitting: Pharmacy Technician

## 2019-06-01 NOTE — Patient Outreach (Signed)
[place] [facility]) Care Management  06/01/2019  [name] 11-01-1945 [id number]  Care coordination call placed to [employer] patient assistance in regards to patient's application for Proventil HFA.  Spoke to [place] who informed patient had been APPROVED 06/01/2019-10/13/2019. She informed it can take up to 3 days for the pharmacy to receive and process the request and another 10-14 business days to arrive at patient's home.  Will followup with patient in 15-20 business days to inquire if medication was received.  [name] P. Chanan Detwiler, [place] Management [phone]

## 2019-06-03 ENCOUNTER — Other Ambulatory Visit: Payer: Self-pay | Admitting: Pharmacy Technician

## 2019-06-03 NOTE — Patient Outreach (Signed)
[place] [facility]) Care Management  06/03/2019  [name] 21-Apr-1946 [id number]   Unsuccessful outreach call placed to patient in regards to [place] application for Premarin Vaginal Cream and [employer]ck application for [employer].  Unfortunately patient did not answer the phone, HIPAA compliant voicemail left.  Was calling patient to inquire if she had received the Premarin Vaginal Cream and to update patient on Merck application. She was approved for [employer] and the Proventil has been shipped.   Will followup with patient in 10-20 business days to inquire if medication received.  Peter Daquila P. Tosh Glaze, [place] Management [phone]

## 2019-06-15 ENCOUNTER — Other Ambulatory Visit: Payer: Self-pay | Admitting: Pharmacy Technician

## 2019-06-15 NOTE — Patient Outreach (Signed)
[place] [facility]) Care Management  06/15/2019  FISHER WIECHMANN [birth year] [id number]  Unsuccessful outreach call placed to patient in regards to [employer] application for Premarin and Merck application for [employer].  Unfortunately patient did not answer the phone, HIPAA compliant voicemail left.  Was calling patient to inquire if she had received either medication as she was approved for both programs.  Will attempt another outreach call in 3-5 business days if call is not returned.  Javiana Anwar P. Jericca Russett, [place] Management [phone]

## 2019-06-16 ENCOUNTER — Other Ambulatory Visit: Payer: Self-pay | Admitting: Pharmacy Technician

## 2019-06-16 NOTE — Patient Outreach (Signed)
[name]) Care Management  06/16/2019  [name] 09-09-1946 [id number]    Successful outreach call placed to patient in regards to [place] application for Premarin Vaginal Cream and [employer]ck application for Proventil HFA.  Spoke to patient, HIPAA identifiers verified.  Patient informed she received 4 tubes of Premarin Vaginal Cream and 3 Proventil HFA inhalers from the respective companies. Discussed each company's refill procedure with the patient. Pfizer requires the provider's phone in the refills and for [employer], the patient may phone in refills. Informed patient to call when she has between 2-3 week supply remaining which will allow time for the companies to process and ship out the medications. Patient verbalized understanding.  Patient inquired about patient assistance for 2021. Informed patient that [facility] has contracted with [place] for care management/patient assistance needs for 2021. Informed patient to contact the [facility] concierge number on the back of [facility] card to inquire about contact information for [facility]. Patient verbalized understanding.  Patient informed she had no other questions or concerns relating to patient assistance at this time. Confirmed patient had contact information.  Will route note to [place] that patient assistance has been completed and will remove myself from care team.  [name]. [name], [place] Management [phone]

## 2019-06-17 ENCOUNTER — Telehealth: Payer: Self-pay | Admitting: Pharmacist

## 2019-06-17 NOTE — Patient Outreach (Signed)
[facility]) Care Management  06/17/2019  [name] Sep 09, 1946 [id number]   Patient's case is being closed because her patient assistance process has been completed. She reported receiving 4 tubes of Premarin Vaginal Cream and 3 Proventil HFA inhalers form Ambulance person.  Plan: Close case. Send closure letters to patient and PCP.   Elayne Guerin, PharmD, [place] Clinical Pharmacist [phone]

## 2019-06-23 ENCOUNTER — Ambulatory Visit: Payer: Self-pay | Admitting: Pharmacist

## 2019-07-14 DIAGNOSIS — J069 Acute upper respiratory infection, unspecified: Secondary | ICD-10-CM | POA: Diagnosis not present

## 2019-07-22 ENCOUNTER — Emergency Department (HOSPITAL_COMMUNITY): Payer: PPO

## 2019-07-22 ENCOUNTER — Other Ambulatory Visit: Payer: Self-pay

## 2019-07-22 ENCOUNTER — Inpatient Hospital Stay (HOSPITAL_COMMUNITY)
Admission: EM | Admit: 2019-07-22 | Discharge: 2019-07-27 | DRG: 378 | Disposition: A | Discharge status: Home / Self Care | Payer: PPO | Attending: Internal Medicine | Admitting: Internal Medicine

## 2019-07-22 ENCOUNTER — Encounter (HOSPITAL_COMMUNITY): Payer: Self-pay

## 2019-07-22 DIAGNOSIS — K2970 Gastritis, unspecified, without bleeding: Secondary | ICD-10-CM

## 2019-07-22 DIAGNOSIS — Z20828 Contact with and (suspected) exposure to other viral communicable diseases: Secondary | ICD-10-CM | POA: Diagnosis not present

## 2019-07-22 DIAGNOSIS — Z90710 Acquired absence of both cervix and uterus: Secondary | ICD-10-CM

## 2019-07-22 DIAGNOSIS — F419 Anxiety disorder, unspecified: Secondary | ICD-10-CM | POA: Diagnosis present

## 2019-07-22 DIAGNOSIS — E872 Acidosis: Secondary | ICD-10-CM | POA: Diagnosis present

## 2019-07-22 DIAGNOSIS — E039 Hypothyroidism, unspecified: Secondary | ICD-10-CM | POA: Diagnosis not present

## 2019-07-22 DIAGNOSIS — K529 Noninfective gastroenteritis and colitis, unspecified: Secondary | ICD-10-CM | POA: Diagnosis not present

## 2019-07-22 DIAGNOSIS — H2511 Age-related nuclear cataract, right eye: Secondary | ICD-10-CM | POA: Diagnosis not present

## 2019-07-22 DIAGNOSIS — Z881 Allergy status to other antibiotic agents status: Secondary | ICD-10-CM | POA: Diagnosis not present

## 2019-07-22 DIAGNOSIS — R1013 Epigastric pain: Secondary | ICD-10-CM | POA: Diagnosis not present

## 2019-07-22 DIAGNOSIS — K5909 Other constipation: Secondary | ICD-10-CM | POA: Diagnosis present

## 2019-07-22 DIAGNOSIS — H2512 Age-related nuclear cataract, left eye: Secondary | ICD-10-CM | POA: Diagnosis not present

## 2019-07-22 DIAGNOSIS — E669 Obesity, unspecified: Secondary | ICD-10-CM | POA: Diagnosis present

## 2019-07-22 DIAGNOSIS — R52 Pain, unspecified: Secondary | ICD-10-CM | POA: Diagnosis not present

## 2019-07-22 DIAGNOSIS — K921 Melena: Secondary | ICD-10-CM | POA: Diagnosis not present

## 2019-07-22 DIAGNOSIS — Z880 Allergy status to penicillin: Secondary | ICD-10-CM

## 2019-07-22 DIAGNOSIS — D62 Acute posthemorrhagic anemia: Secondary | ICD-10-CM | POA: Diagnosis not present

## 2019-07-22 DIAGNOSIS — Z882 Allergy status to sulfonamides status: Secondary | ICD-10-CM | POA: Diagnosis not present

## 2019-07-22 DIAGNOSIS — K2900 Acute gastritis without bleeding: Secondary | ICD-10-CM | POA: Diagnosis not present

## 2019-07-22 DIAGNOSIS — K922 Gastrointestinal hemorrhage, unspecified: Secondary | ICD-10-CM

## 2019-07-22 DIAGNOSIS — K920 Hematemesis: Secondary | ICD-10-CM | POA: Diagnosis not present

## 2019-07-22 DIAGNOSIS — E785 Hyperlipidemia, unspecified: Secondary | ICD-10-CM | POA: Diagnosis not present

## 2019-07-22 DIAGNOSIS — Z885 Allergy status to narcotic agent status: Secondary | ICD-10-CM

## 2019-07-22 DIAGNOSIS — Z8249 Family history of ischemic heart disease and other diseases of the circulatory system: Secondary | ICD-10-CM | POA: Diagnosis not present

## 2019-07-22 DIAGNOSIS — Z79899 Other long term (current) drug therapy: Secondary | ICD-10-CM

## 2019-07-22 DIAGNOSIS — K5792 Diverticulitis of intestine, part unspecified, without perforation or abscess without bleeding: Secondary | ICD-10-CM | POA: Diagnosis not present

## 2019-07-22 DIAGNOSIS — Z7982 Long term (current) use of aspirin: Secondary | ICD-10-CM

## 2019-07-22 DIAGNOSIS — Z6832 Body mass index (BMI) 32.0-32.9, adult: Secondary | ICD-10-CM

## 2019-07-22 DIAGNOSIS — K259 Gastric ulcer, unspecified as acute or chronic, without hemorrhage or perforation: Secondary | ICD-10-CM | POA: Diagnosis not present

## 2019-07-22 DIAGNOSIS — K582 Mixed irritable bowel syndrome: Secondary | ICD-10-CM | POA: Diagnosis present

## 2019-07-22 DIAGNOSIS — R935 Abnormal findings on diagnostic imaging of other abdominal regions, including retroperitoneum: Secondary | ICD-10-CM | POA: Diagnosis not present

## 2019-07-22 DIAGNOSIS — Z85820 Personal history of malignant melanoma of skin: Secondary | ICD-10-CM | POA: Diagnosis not present

## 2019-07-22 DIAGNOSIS — R109 Unspecified abdominal pain: Secondary | ICD-10-CM | POA: Diagnosis present

## 2019-07-22 DIAGNOSIS — K5713 Diverticulitis of small intestine without perforation or abscess with bleeding: Principal | ICD-10-CM | POA: Diagnosis present

## 2019-07-22 DIAGNOSIS — R1010 Upper abdominal pain, unspecified: Secondary | ICD-10-CM | POA: Diagnosis not present

## 2019-07-22 DIAGNOSIS — K2901 Acute gastritis with bleeding: Secondary | ICD-10-CM | POA: Diagnosis not present

## 2019-07-22 DIAGNOSIS — K5732 Diverticulitis of large intestine without perforation or abscess without bleeding: Secondary | ICD-10-CM | POA: Diagnosis not present

## 2019-07-22 DIAGNOSIS — I10 Essential (primary) hypertension: Secondary | ICD-10-CM | POA: Diagnosis present

## 2019-07-22 DIAGNOSIS — F329 Major depressive disorder, single episode, unspecified: Secondary | ICD-10-CM | POA: Diagnosis present

## 2019-07-22 DIAGNOSIS — A09 Infectious gastroenteritis and colitis, unspecified: Secondary | ICD-10-CM | POA: Diagnosis not present

## 2019-07-22 DIAGNOSIS — E876 Hypokalemia: Secondary | ICD-10-CM | POA: Diagnosis not present

## 2019-07-22 DIAGNOSIS — Z79890 Hormone replacement therapy (postmenopausal): Secondary | ICD-10-CM

## 2019-07-22 DIAGNOSIS — K5712 Diverticulitis of small intestine without perforation or abscess without bleeding: Secondary | ICD-10-CM | POA: Diagnosis not present

## 2019-07-22 DIAGNOSIS — E7800 Pure hypercholesterolemia, unspecified: Secondary | ICD-10-CM | POA: Diagnosis not present

## 2019-07-22 DIAGNOSIS — K279 Peptic ulcer, site unspecified, unspecified as acute or chronic, without hemorrhage or perforation: Secondary | ICD-10-CM | POA: Diagnosis not present

## 2019-07-22 LAB — POC OCCULT BLOOD, ED: Fecal Occult Bld: POSITIVE — AB

## 2019-07-22 LAB — TYPE AND SCREEN
ABO/RH(D): O POS
Antibody Screen: NEGATIVE

## 2019-07-22 LAB — COMPREHENSIVE METABOLIC PANEL
ALT: 28 U/L (ref 0–44)
AST: 16 U/L (ref 15–41)
Albumin: 3.4 g/dL — ABNORMAL LOW (ref 3.5–5.0)
Alkaline Phosphatase: 69 U/L (ref 38–126)
Anion gap: 10 (ref 5–15)
BUN: 34 mg/dL — ABNORMAL HIGH (ref 8–23)
CO2: 23 mmol/L (ref 22–32)
Calcium: 8.7 mg/dL — ABNORMAL LOW (ref 8.9–10.3)
Chloride: 106 mmol/L (ref 98–111)
Creatinine, Ser: 0.84 mg/dL (ref 0.44–1.00)
GFR calc Af Amer: >60 mL/min (ref >60)
GFR calc non Af Amer: >60 mL/min (ref >60)
Glucose, Bld: 108 mg/dL — ABNORMAL HIGH (ref 70–99)
Potassium: 3.6 mmol/L (ref 3.5–5.1)
Sodium: 139 mmol/L (ref 135–145)
Total Bilirubin: 0.4 mg/dL (ref 0.3–1.2)
Total Protein: 6.2 g/dL — ABNORMAL LOW (ref 6.5–8.1)

## 2019-07-22 LAB — CBC
HCT: 37.7 % (ref 36.0–46.0)
Hemoglobin: 12.4 g/dL (ref 12.0–15.0)
MCH: 30.9 pg (ref 26.0–34.0)
MCHC: 32.9 g/dL (ref 30.0–36.0)
MCV: 94 fL (ref 80.0–100.0)
Platelets: 273 10*3/uL (ref 150–400)
RBC: 4.01 MIL/uL (ref 3.87–5.11)
RDW: 13.3 % (ref 11.5–15.5)
WBC: 18.9 10*3/uL — ABNORMAL HIGH (ref 4.0–10.5)
nRBC: 0 % (ref 0.0–0.2)

## 2019-07-22 MED ORDER — IOHEXOL 300 MG/ML  SOLN
100.0000 mL | Freq: Once | INTRAMUSCULAR | Status: AC | PRN
  Administered 2019-07-22: 100 mL via INTRAVENOUS

## 2019-07-22 MED ORDER — CIPROFLOXACIN IN D5W 400 MG/200ML IV SOLN
400.0000 mg | Freq: Once | INTRAVENOUS | Status: AC
  Administered 2019-07-23: 400 mg via INTRAVENOUS
  Filled 2019-07-22: qty 200

## 2019-07-22 MED ORDER — SODIUM CHLORIDE 0.9 % IV SOLN
80.0000 mg | Freq: Once | INTRAVENOUS | Status: AC
  Administered 2019-07-22: 80 mg via INTRAVENOUS
  Filled 2019-07-22: qty 80

## 2019-07-22 MED ORDER — DICYCLOMINE HCL 10 MG/ML IM SOLN
20.0000 mg | Freq: Once | INTRAMUSCULAR | Status: AC
  Administered 2019-07-22: 20 mg via INTRAMUSCULAR
  Filled 2019-07-22: qty 2

## 2019-07-22 MED ORDER — TIGECYCLINE 50 MG IV SOLR
100.0000 mg | Freq: Once | INTRAVENOUS | Status: AC
  Administered 2019-07-23: 100 mg via INTRAVENOUS
  Filled 2019-07-22: qty 100

## 2019-07-22 NOTE — ED Notes (Signed)
Pt transported to Xray. 

## 2019-07-22 NOTE — ED Notes (Signed)
Pt transported to CT with CT Tech.

## 2019-07-22 NOTE — ED Triage Notes (Signed)
Patient c/o left mid abdominal pain and states she began having black tarry stools last night. Patient denies any vomiting.

## 2019-07-22 NOTE — ED Provider Notes (Signed)
[facility] DEPT Provider Note   CSN: [id number] Arrival date & time: 07/22/19  [place]     History   Chief Complaint Chief Complaint  Patient presents with   GI Bleeding   Abdominal Pain    HPI [name] is a 73 y.o. 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  Patient's medication list is significant for aspirin 81 mg daily.  Patient reports that she had sudden onset abdominal pain yesterday evening which was followed by nausea and retching.  She reports that after the retching, she developed hematemesis.  She reports that she has had previous hematemesis after having surgery to her hard palate.  She reports that she was having bright red blood and clots.  Shortly afterwards, patient developed melena.  She did not have any further episodes of hematemesis after yesterday evening and her last melenic stool was at 1 PM this afternoon.  She is passing gas.  Patient does endorse fever, dizziness.      Past Medical History:  Diagnosis Date   Depression    Hypertension    IBS (irritable bowel syndrome)    Malignant melanoma ([place])     Patient Active Problem List   Diagnosis Date Noted   Concussion with loss of consciousness 06/02/2014   Left-sided weakness 05/28/2014   Syncope and collapse 05/28/2014   Scalp contusion 05/28/2014   Hypertension     Past Surgical History:  Procedure Laterality Date   BLADDER REPAIR     FOOT SURGERY Bilateral    HERNIA REPAIR     MELANOMA EXCISION     VAGINAL HYSTERECTOMY       OB History   No obstetric history on file.      Home Medications    Prior to Admission medications   Medication Sig Start Date End Date Taking? Authorizing  Provider  Acetaminophen (TYLENOL PO) Take 2 tablets by mouth daily as needed (for pain or headache).   Yes [provider]  ALPRAZolam Duanne Moron) 1 MG tablet Take 2 mg by mouth 2 (two) times daily as needed for anxiety.  05/16/14  Yes [provider]  aspirin EC 81 MG tablet Take 81 mg by mouth at bedtime.   Yes [provider]  atorvastatin (LIPITOR) 40 MG tablet Take 40 mg by mouth daily at 6 PM.  01/27/19  Yes [provider]  cyclobenzaprine (FLEXERIL) 5 MG tablet Take 5 mg by mouth 3 (three) times daily as needed for muscle spasms.  02/15/19  Yes [provider]  hydrochlorothiazide (HYDRODIURIL) 25 MG tablet Take 25 mg by mouth daily. 05/11/14  Yes [provider]  Ketotifen Fumarate (ALAWAY OP) Place 5 drops into both eyes daily as needed (for allergies).   Yes [provider]  levothyroxine (SYNTHROID) 50 MCG tablet Take 50 mcg by mouth daily before breakfast.  02/13/19  Yes [provider]  meloxicam (MOBIC) 15 MG tablet Take 15 mg by mouth daily as needed for pain.  02/15/19  Yes [provider]  metoprolol tartrate (LOPRESSOR) 50 MG tablet Take 50 mg by mouth 2 (two) times daily.  05/11/14  Yes [provider]  montelukast (SINGULAIR) 10 MG tablet Take 1 tablet by mouth daily. 02/03/19  Yes [provider]  [place] 108 [phone] [place])  MCG/ACT inhaler Inhale 2 puffs into the lungs every 4 (four) hours as needed. 02/04/19  Yes [provider]  promethazine (PHENERGAN) 25 MG tablet Take 1 tablet (25 mg total) by mouth every 8 (eight) hours as needed for nausea or vomiting. 01/06/18  Yes Dean, Tonna [place], MD  zolpidem (AMBIEN) 10 MG tablet Take 10 mg by mouth at bedtime as needed for sleep.  03/22/19  Yes [provider]  benzonatate (TESSALON) 100 MG capsule Take 1-2 capsules (100-200 mg total) by mouth 3 (three) times daily. Patient not taking: Reported on 07/22/2019 12/28/18   Tereasa Coop,  PA-C  conjugated estrogens (PREMARIN) vaginal cream Place 1 Applicatorful vaginally once a week.    [provider]  hydrocortisone valerate ointment (WESTCORT) 0.2 % Apply 1 application topically 2 (two) times daily. Patient not taking: Reported on 07/22/2019 08/05/15   Kinnie Feil, MD  HYDROmorphone (DILAUDID) 2 MG tablet Take 0.5 tablets (1 mg total) by mouth every 12 (twelve) hours as needed for severe pain. Patient not taking: Reported on 07/22/2019 01/21/18   Meredith Pel, MD  ibuprofen (ADVIL,MOTRIN) 200 MG tablet Take 3 tablets (600 mg total) by mouth every 4 (four) hours as needed for fever, headache or mild pain. Patient not taking: Reported on 07/22/2019 05/30/14   Debbe Odea, MD    Family History Family History  Problem Relation Age of Onset   Heart attack Mother    Hypertension Mother    Cancer Father    Diabetes Father    Hypertension Father     Social History Social History   Tobacco Use   Smoking status: Never Smoker   Smokeless tobacco: Never Used  Substance Use Topics   Alcohol use: No   Drug use: No     Allergies   Codeine, Penicillins, Flagyl [metronidazole], and Sulfa antibiotics   Review of Systems Review of Systems  Constitutional: Positive for fatigue and fever.  HENT: Negative for postnasal drip, sinus pressure, sneezing and sore throat.   Eyes: Negative.   Respiratory: Negative.   Cardiovascular: Negative.   Gastrointestinal: Positive for abdominal pain, blood in stool, nausea and vomiting. Negative for abdominal distention.  Genitourinary: Negative for dysuria and frequency.  Musculoskeletal: Negative.   Skin: Negative.  Negative for pallor.  Allergic/Immunologic: Negative for immunocompromised state.  Neurological: Positive for dizziness and light-headedness.  Hematological: Negative.      Physical Exam Updated Vital Signs BP (!) 106/46    Pulse 81    Temp 99.6 F (37.6 C) (Oral)    Resp 18    Ht 5\' 5"   (1.651 m)    Wt 88.5 kg    SpO2 96%    BMI 32.45 kg/m   Physical Exam   ED Treatments / Results  Labs (all labs ordered are listed, but only abnormal results are displayed) Labs Reviewed  COMPREHENSIVE METABOLIC PANEL - Abnormal; Notable for the following components:      Result Value   Glucose, Bld 108 (*)    BUN 34 (*)    Calcium 8.7 (*)    Total Protein 6.2 (*)    Albumin 3.4 (*)    All other components within normal limits  CBC - Abnormal; Notable for the following components:   WBC 18.9 (*)    All other components within normal limits  POC OCCULT BLOOD, ED - Abnormal; Notable for the following components:   Fecal Occult Bld POSITIVE (*)    All other components within normal limits  SARS CORONAVIRUS 2 (TAT 6-24 HRS)  CULTURE, BLOOD (ROUTINE X 2)  CULTURE, BLOOD (ROUTINE X 2)  LACTIC ACID, PLASMA  TYPE AND SCREEN    EKG None  Radiology Dg Chest 2 View  Result Date: 07/22/2019 CLINICAL DATA:  Lower abdominal pain with hematemesis and melena. EXAM: CHEST - 2 VIEW COMPARISON:  Chest x-ray dated April 05, 2019. FINDINGS: The heart size and mediastinal contours are within normal limits. Both lungs are clear. The visualized skeletal structures are unremarkable. Unchanged surgical clips near the gastroesophageal junction. Prior cholecystectomy. IMPRESSION: No active cardiopulmonary disease. Electronically Signed   By: Titus Dubin M.D.   On: 07/22/2019 20:38   Ct Abdomen Pelvis W Contrast  Result Date: 07/22/2019 CLINICAL DATA:  GI bleeding. EXAM: CT ABDOMEN AND PELVIS WITH CONTRAST TECHNIQUE: Multidetector CT imaging of the abdomen and pelvis was performed using the standard protocol following bolus administration of intravenous contrast. CONTRAST:  178mL OMNIPAQUE IOHEXOL 300 MG/ML  SOLN COMPARISON:  None. FINDINGS: Lower chest: Postsurgical changes at the GE junction. Hepatobiliary: No focal liver abnormality is seen. Status post cholecystectomy. No biliary dilatation.  Pancreas: Unremarkable. No pancreatic ductal dilatation or surrounding inflammatory changes. Spleen: Normal in size without focal abnormality. Adrenals/Urinary Tract: Adrenal glands are unremarkable. Kidneys are normal, without renal calculi, focal lesion, or hydronephrosis. Bladder is unremarkable. Stomach/Bowel: Probable prior Nissen fundoplication. No evidence of small-bowel obstruction. Focal mucosal thickening and distension of short segment of distal ileum in the right pelvis with adjacent mesenteric stranding and 2 tiny gas bubbles, image 72/94, axial images and 82/151, coronal images. A cluster of small marked rounded lymph nodes in the adjacent mesentery. Milder circumferential mucosal thickening of the upstream ileum, image 72/94. Vascular/Lymphatic: No significant vascular findings are present. No enlarged abdominal or pelvic lymph nodes. Reproductive: Status post hysterectomy. No adnexal masses. Other: No abdominal wall hernia or abnormality. No abdominopelvic ascites. Musculoskeletal: No acute or significant osseous findings. IMPRESSION: 1. Focal mucosal thickening and distension of short segment of distal ileum in the right pelvis with adjacent mesenteric stranding and 2 tiny gas bubbles. This may represent a focal area of infectious/inflammatory enteritis, or potentially malignancy. Micro perforation cannot be excluded. Milder circumferential mucosal thickening of the upstream ileum is also present. 2. Cluster of small, few mm, but rounded lymph nodes in the adjacent mesentery. 3. No evidence of small-bowel obstruction. 4. Postsurgical changes at the GE junction. 5. No evidence of acute abnormalities within the solid abdominal organs. Electronically Signed   By: Fidela Salisbury M.D.   On: 07/22/2019 22:01    Procedures Procedures (including critical care time)  Medications Ordered in ED Medications  tigecycline (TYGACIL) 100 mg in sodium chloride 0.9 % 100 mL IVPB (has no administration in  time range)  dicyclomine (BENTYL) injection 20 mg (20 mg Intramuscular Given 07/22/19 2148)  pantoprazole (PROTONIX) 80 mg in sodium chloride 0.9 % 100 mL IVPB (0 mg Intravenous Stopped 07/22/19 2219)  iohexol (OMNIPAQUE) 300 MG/ML solution 100 mL (100 mLs Intravenous Contrast Given 07/22/19 2127)     Initial Impression / Assessment and Plan / ED Course  I have reviewed the triage vital signs and the nursing notes.  Pertinent labs & imaging results that were available during my care of the patient were reviewed by me and considered in my medical decision making (see chart for details). Hematemesis/Melena: Unclear of what caused initial nausea and heaving, however hematemesis likely consistent [place] 2/2 retching.  Additionally, patient with history of esophageal surgery as well as surgery  to elevate roof of mouth to elevate palate which she reports has bled in the past.  No history of hepatic problems in the past and unknown if patient has any portal hypertension.  Patient is overall otherwise stable and her vitals are within normal limits with exception of fever to 102 upon arrival.  Her orthostatic vitals are also negative for orthostasis.  Will start IV Protonix 80 mg given concern for upper GI bleed with several risk factors.  Patient does endorse fatigue and dizziness earlier today concerning for symptomatic anemia secondary to GI bleed.  However CBC returned with normal hemoglobin.  Patient reports that her daughter is a Marine scientist on the fifth floor here at [place] [employer].   Lower abdominal pain: Patient also with severe lower abdominal pain but is still passing gas.  Less concern for SBO but should rule out diverticulitis, colitis, mesenteric ischemia, appendicitis.  Given fever and white blood cell count, concern for infection.  Will obtain CT abdomen pelvis with contrast.  Patient will likely require admission and will obtain COVID testing at this time.  CT abdomen pelvis significant for focal  mucosal thickening and distention in the distal ileum and adjacent mesenteric stranding and few tiny gas bubbles.  Per read, this is likely to represent either infectious or inflammatory changes and cannot rule out any malignancy. Spoke with patient and her husband about admission for IV antibiotics and further monitoring given her initial fever, white blood cell count and potential for microperforation seen on MRI.  They are both agreeable.  We will start empiric IV medication for intra-abdominal infection with Cipro 400 mg.  Given metronidazole allergy and penicillin allergy, called pharmacy who suggested tygacil as initial dose, which will last until noon on 07/23/19 for narrowing abx or consulting ID in the AM.    Final Clinical Impressions(s) / ED Diagnoses   Final diagnoses:  Upper GI bleed  Colitis, nonspecific   Admit to hospitalist team  ED Discharge Orders    None       Wilber Oliphant, MD 07/22/19 2324    Carmin Muskrat, MD 07/22/19 2354

## 2019-07-23 ENCOUNTER — Inpatient Hospital Stay (HOSPITAL_COMMUNITY): Payer: PPO

## 2019-07-23 DIAGNOSIS — E039 Hypothyroidism, unspecified: Secondary | ICD-10-CM | POA: Diagnosis present

## 2019-07-23 DIAGNOSIS — F419 Anxiety disorder, unspecified: Secondary | ICD-10-CM

## 2019-07-23 DIAGNOSIS — K920 Hematemesis: Secondary | ICD-10-CM

## 2019-07-23 DIAGNOSIS — K2901 Acute gastritis with bleeding: Secondary | ICD-10-CM | POA: Diagnosis not present

## 2019-07-23 DIAGNOSIS — Z20828 Contact with and (suspected) exposure to other viral communicable diseases: Secondary | ICD-10-CM | POA: Diagnosis present

## 2019-07-23 DIAGNOSIS — K2900 Acute gastritis without bleeding: Secondary | ICD-10-CM | POA: Diagnosis present

## 2019-07-23 DIAGNOSIS — K5909 Other constipation: Secondary | ICD-10-CM | POA: Diagnosis present

## 2019-07-23 DIAGNOSIS — I10 Essential (primary) hypertension: Secondary | ICD-10-CM | POA: Diagnosis present

## 2019-07-23 DIAGNOSIS — A09 Infectious gastroenteritis and colitis, unspecified: Secondary | ICD-10-CM | POA: Diagnosis present

## 2019-07-23 DIAGNOSIS — K5713 Diverticulitis of small intestine without perforation or abscess with bleeding: Secondary | ICD-10-CM | POA: Diagnosis present

## 2019-07-23 DIAGNOSIS — K921 Melena: Secondary | ICD-10-CM | POA: Diagnosis not present

## 2019-07-23 DIAGNOSIS — R109 Unspecified abdominal pain: Secondary | ICD-10-CM | POA: Diagnosis present

## 2019-07-23 DIAGNOSIS — E669 Obesity, unspecified: Secondary | ICD-10-CM | POA: Diagnosis present

## 2019-07-23 DIAGNOSIS — Z6832 Body mass index (BMI) 32.0-32.9, adult: Secondary | ICD-10-CM | POA: Diagnosis not present

## 2019-07-23 DIAGNOSIS — Z8249 Family history of ischemic heart disease and other diseases of the circulatory system: Secondary | ICD-10-CM | POA: Diagnosis not present

## 2019-07-23 DIAGNOSIS — Z7982 Long term (current) use of aspirin: Secondary | ICD-10-CM | POA: Diagnosis not present

## 2019-07-23 DIAGNOSIS — E876 Hypokalemia: Secondary | ICD-10-CM | POA: Diagnosis present

## 2019-07-23 DIAGNOSIS — Z882 Allergy status to sulfonamides status: Secondary | ICD-10-CM | POA: Diagnosis not present

## 2019-07-23 DIAGNOSIS — K5792 Diverticulitis of intestine, part unspecified, without perforation or abscess without bleeding: Secondary | ICD-10-CM | POA: Diagnosis not present

## 2019-07-23 DIAGNOSIS — D62 Acute posthemorrhagic anemia: Secondary | ICD-10-CM | POA: Diagnosis present

## 2019-07-23 DIAGNOSIS — Z885 Allergy status to narcotic agent status: Secondary | ICD-10-CM | POA: Diagnosis not present

## 2019-07-23 DIAGNOSIS — Z880 Allergy status to penicillin: Secondary | ICD-10-CM | POA: Diagnosis not present

## 2019-07-23 DIAGNOSIS — K529 Noninfective gastroenteritis and colitis, unspecified: Secondary | ICD-10-CM

## 2019-07-23 DIAGNOSIS — E785 Hyperlipidemia, unspecified: Secondary | ICD-10-CM | POA: Diagnosis present

## 2019-07-23 DIAGNOSIS — F329 Major depressive disorder, single episode, unspecified: Secondary | ICD-10-CM | POA: Diagnosis present

## 2019-07-23 DIAGNOSIS — R52 Pain, unspecified: Secondary | ICD-10-CM | POA: Diagnosis not present

## 2019-07-23 DIAGNOSIS — E872 Acidosis: Secondary | ICD-10-CM | POA: Diagnosis present

## 2019-07-23 DIAGNOSIS — Z881 Allergy status to other antibiotic agents status: Secondary | ICD-10-CM | POA: Diagnosis not present

## 2019-07-23 DIAGNOSIS — Z85820 Personal history of malignant melanoma of skin: Secondary | ICD-10-CM | POA: Diagnosis not present

## 2019-07-23 DIAGNOSIS — Z90710 Acquired absence of both cervix and uterus: Secondary | ICD-10-CM | POA: Diagnosis not present

## 2019-07-23 DIAGNOSIS — K582 Mixed irritable bowel syndrome: Secondary | ICD-10-CM | POA: Diagnosis present

## 2019-07-23 DIAGNOSIS — K279 Peptic ulcer, site unspecified, unspecified as acute or chronic, without hemorrhage or perforation: Secondary | ICD-10-CM | POA: Diagnosis not present

## 2019-07-23 LAB — BASIC METABOLIC PANEL
Anion gap: 12 (ref 5–15)
BUN: 31 mg/dL — ABNORMAL HIGH (ref 8–23)
CO2: 21 mmol/L — ABNORMAL LOW (ref 22–32)
Calcium: 8.2 mg/dL — ABNORMAL LOW (ref 8.9–10.3)
Chloride: 106 mmol/L (ref 98–111)
Creatinine, Ser: 0.93 mg/dL (ref 0.44–1.00)
GFR calc Af Amer: >60 mL/min (ref >60)
GFR calc non Af Amer: >60 mL/min (ref >60)
Glucose, Bld: 106 mg/dL — ABNORMAL HIGH (ref 70–99)
Potassium: 3.6 mmol/L (ref 3.5–5.1)
Sodium: 139 mmol/L (ref 135–145)

## 2019-07-23 LAB — CBC
HCT: 37.1 % (ref 36.0–46.0)
HCT: 34.6 % — ABNORMAL LOW (ref 36.0–46.0)
HCT: 33 % — ABNORMAL LOW (ref 36.0–46.0)
HCT: 33.9 % — ABNORMAL LOW (ref 36.0–46.0)
Hemoglobin: 11.9 g/dL — ABNORMAL LOW (ref 12.0–15.0)
Hemoglobin: 11.3 g/dL — ABNORMAL LOW (ref 12.0–15.0)
Hemoglobin: 10.6 g/dL — ABNORMAL LOW (ref 12.0–15.0)
Hemoglobin: 11 g/dL — ABNORMAL LOW (ref 12.0–15.0)
MCH: 31.2 pg (ref 26.0–34.0)
MCH: 31.2 pg (ref 26.0–34.0)
MCH: 30.8 pg (ref 26.0–34.0)
MCH: 31.1 pg (ref 26.0–34.0)
MCHC: 32.1 g/dL (ref 30.0–36.0)
MCHC: 32.7 g/dL (ref 30.0–36.0)
MCHC: 32.1 g/dL (ref 30.0–36.0)
MCHC: 32.4 g/dL (ref 30.0–36.0)
MCV: 97.1 fL (ref 80.0–100.0)
MCV: 95.6 fL (ref 80.0–100.0)
MCV: 95.9 fL (ref 80.0–100.0)
MCV: 95.8 fL (ref 80.0–100.0)
Platelets: 237 10*3/uL (ref 150–400)
Platelets: 236 10*3/uL (ref 150–400)
Platelets: 225 10*3/uL (ref 150–400)
Platelets: 234 10*3/uL (ref 150–400)
RBC: 3.82 MIL/uL — ABNORMAL LOW (ref 3.87–5.11)
RBC: 3.62 MIL/uL — ABNORMAL LOW (ref 3.87–5.11)
RBC: 3.44 MIL/uL — ABNORMAL LOW (ref 3.87–5.11)
RBC: 3.54 MIL/uL — ABNORMAL LOW (ref 3.87–5.11)
RDW: 13.6 % (ref 11.5–15.5)
RDW: 13.5 % (ref 11.5–15.5)
RDW: 13.6 % (ref 11.5–15.5)
RDW: 13.5 % (ref 11.5–15.5)
WBC: 17 10*3/uL — ABNORMAL HIGH (ref 4.0–10.5)
WBC: 14.3 10*3/uL — ABNORMAL HIGH (ref 4.0–10.5)
WBC: 13.8 10*3/uL — ABNORMAL HIGH (ref 4.0–10.5)
WBC: 14.7 10*3/uL — ABNORMAL HIGH (ref 4.0–10.5)
nRBC: 0 % (ref 0.0–0.2)
nRBC: 0 % (ref 0.0–0.2)
nRBC: 0 % (ref 0.0–0.2)
nRBC: 0 % (ref 0.0–0.2)

## 2019-07-23 LAB — LIPASE, BLOOD: Lipase: 20 U/L (ref 11–51)

## 2019-07-23 LAB — BRAIN NATRIURETIC PEPTIDE: B Natriuretic Peptide: 44.1 pg/mL (ref 0.0–100.0)

## 2019-07-23 LAB — SARS CORONAVIRUS 2 (TAT 6-24 HRS): SARS Coronavirus 2: NEGATIVE

## 2019-07-23 MED ORDER — OXYCODONE HCL 5 MG PO TABS
5.0000 mg | ORAL_TABLET | Freq: Four times a day (QID) | ORAL | Status: DC | PRN
  Administered 2019-07-23 – 2019-07-26 (×4): 5 mg via ORAL
  Filled 2019-07-23 (×4): qty 1

## 2019-07-23 MED ORDER — STERILE WATER FOR INJECTION IJ SOLN
INTRAMUSCULAR | Status: AC
  Filled 2019-07-23: qty 10

## 2019-07-23 MED ORDER — METOPROLOL TARTRATE 50 MG PO TABS
50.0000 mg | ORAL_TABLET | Freq: Two times a day (BID) | ORAL | Status: DC
  Administered 2019-07-23 – 2019-07-27 (×9): 50 mg via ORAL
  Filled 2019-07-23 (×10): qty 1

## 2019-07-23 MED ORDER — LEVOTHYROXINE SODIUM 50 MCG PO TABS
50.0000 ug | ORAL_TABLET | Freq: Every day | ORAL | Status: DC
  Administered 2019-07-23 – 2019-07-27 (×5): 50 ug via ORAL
  Filled 2019-07-23 (×7): qty 1

## 2019-07-23 MED ORDER — MONTELUKAST SODIUM 10 MG PO TABS
10.0000 mg | ORAL_TABLET | Freq: Every day | ORAL | Status: DC
  Administered 2019-07-23 – 2019-07-27 (×4): 10 mg via ORAL
  Filled 2019-07-23 (×5): qty 1

## 2019-07-23 MED ORDER — ALPRAZOLAM 1 MG PO TABS: 2.0000 mg | ORAL_TABLET | Freq: Two times a day (BID) | ORAL | Status: DC | PRN

## 2019-07-23 MED ORDER — OXYCODONE HCL 5 MG PO TABS
5.0000 mg | ORAL_TABLET | Freq: Once | ORAL | Status: AC
  Administered 2019-07-23: 5 mg via ORAL
  Filled 2019-07-23: qty 1

## 2019-07-23 MED ORDER — TRAMADOL HCL 50 MG PO TABS
50.0000 mg | ORAL_TABLET | Freq: Two times a day (BID) | ORAL | Status: DC | PRN
  Administered 2019-07-23 – 2019-07-26 (×4): 50 mg via ORAL
  Filled 2019-07-23 (×4): qty 1

## 2019-07-23 MED ORDER — ENOXAPARIN SODIUM 40 MG/0.4ML ~~LOC~~ SOLN
40.0000 mg | SUBCUTANEOUS | Status: DC
  Administered 2019-07-23 – 2019-07-26 (×4): 40 mg via SUBCUTANEOUS
  Filled 2019-07-23 (×4): qty 0.4

## 2019-07-23 MED ORDER — PANTOPRAZOLE SODIUM 40 MG IV SOLR
40.0000 mg | Freq: Every day | INTRAVENOUS | Status: DC
  Administered 2019-07-23 (×2): 40 mg via INTRAVENOUS
  Filled 2019-07-23 (×2): qty 40

## 2019-07-23 MED ORDER — ATORVASTATIN CALCIUM 40 MG PO TABS
40.0000 mg | ORAL_TABLET | Freq: Every day | ORAL | Status: DC
  Administered 2019-07-23 – 2019-07-26 (×4): 40 mg via ORAL
  Filled 2019-07-23 (×4): qty 1

## 2019-07-23 MED ORDER — SODIUM CHLORIDE 0.9 % IV SOLN
INTRAVENOUS | Status: DC
  Administered 2019-07-23 (×2): via INTRAVENOUS

## 2019-07-23 MED ORDER — ONDANSETRON HCL 4 MG/2ML IJ SOLN
4.0000 mg | Freq: Three times a day (TID) | INTRAMUSCULAR | Status: DC
  Administered 2019-07-23 – 2019-07-24 (×5): 4 mg via INTRAVENOUS
  Filled 2019-07-23 (×5): qty 2

## 2019-07-23 MED ORDER — ALPRAZOLAM 1 MG PO TABS: 1.0000 mg | ORAL_TABLET | Freq: Two times a day (BID) | ORAL | Status: DC | PRN

## 2019-07-23 MED ORDER — HYDROCHLOROTHIAZIDE 25 MG PO TABS
25.0000 mg | ORAL_TABLET | Freq: Every day | ORAL | Status: DC
  Administered 2019-07-23 – 2019-07-27 (×5): 25 mg via ORAL
  Filled 2019-07-23 (×5): qty 1

## 2019-07-23 MED ORDER — SODIUM CHLORIDE 0.9 % IV SOLN
1.0000 g | Freq: Three times a day (TID) | INTRAVENOUS | Status: DC
  Administered 2019-07-23 – 2019-07-27 (×12): 1 g via INTRAVENOUS
  Filled 2019-07-23 (×14): qty 1

## 2019-07-23 MED ORDER — ZOLPIDEM TARTRATE 5 MG PO TABS
5.0000 mg | ORAL_TABLET | Freq: Every evening | ORAL | Status: DC | PRN
  Administered 2019-07-23 – 2019-07-26 (×4): 5 mg via ORAL
  Filled 2019-07-23 (×4): qty 1

## 2019-07-23 MED ORDER — ALPRAZOLAM 0.5 MG PO TABS
0.5000 mg | ORAL_TABLET | Freq: Two times a day (BID) | ORAL | Status: DC | PRN
  Administered 2019-07-23 – 2019-07-26 (×3): 0.5 mg via ORAL
  Filled 2019-07-23 (×3): qty 1

## 2019-07-23 MED ORDER — PROMETHAZINE HCL 25 MG/ML IJ SOLN
12.5000 mg | Freq: Four times a day (QID) | INTRAMUSCULAR | Status: DC | PRN
  Administered 2019-07-23 – 2019-07-24 (×2): 12.5 mg via INTRAVENOUS
  Filled 2019-07-23 (×2): qty 1

## 2019-07-23 NOTE — Progress Notes (Signed)
Pharmacy Antibiotic Note  [name] is a 73 y.o. female admitted on 07/22/2019 with Intra-abdominal infection.  Pharmacy has been consulted for meropenem dosing.  Patient with allergy to penicillins and flagyl.  Only antibiotics noted in Epic was prior clindamycin.  Plan: Meropenem 1 gm IV q8h Pharmacy to sign off  Height: 5\' 5"  (165.1 cm) Weight: 195 lb (88.5 kg) IBW/kg (Calculated) : 57  Temp (24hrs), Avg:98.7 F (37.1 C), Min:98.2 F (36.8 C), Max:99.6 F (37.6 C)  Recent Labs  Lab 07/22/19 1945 07/23/19 0328 07/23/19 0643  WBC 18.9* 17.0* 14.3*  CREATININE 0.84 0.93  --     Estimated Creatinine Clearance: 59.2 mL/min (by C-G formula based on SCr of 0.93 mg/dL).    Allergies  Allergen Reactions  . Codeine Nausea And Vomiting  . Penicillins Swelling  . Flagyl [Metronidazole] Hives  . Sulfa Antibiotics Nausea And Vomiting   Thank you for allowing pharmacy to be a part of this patient's care.  Eudelia Bunch, Pharm.D [phone] 07/23/2019 11:12 AM

## 2019-07-23 NOTE — H&P (Addendum)
History and Physical    [name] [id number] DOB: [birth year] DOA: 07/22/2019  PCP: Alroy Dust, L.Marlou Sa, MD  Patient coming from: Home, husband at bedside  I have personally briefly reviewed patient's old medical records in [place]  Chief Complaint: Abdominal pain  HPI: [name] is a 73 y.o. female with medical history significant of IBS with alternating constipation and diarrhea, concussion with residual memory decline, malignant melanoma, hypertension, depression who presented with concerns of fatigue, and increasing abdominal pain.  Patient started to have midline abdominal pain yesterday and had diarrhea with dark tarry stool.  Patient has had a history of what appears to be Nissen fundoplication and has a hard time vomiting but was retching and also noticed blood and small clots in her vomit.  She felt warm but could not check her temperature.  She denies any past history of GI bleed.  Reports having a colonoscopy several years ago with no significant finding other than polyps. Patient denies any chest pain, shortness of breath.  ED Course: Patient reportedly was febrile up to 102 ( not documented) and normotensive on room air.  CBC showed leukocytosis of 18.9 with stable hemoglobin of 12.4.  CMP showed elevated BUN of 34 and stable creatinine of 0.84.  Fecal occult blood positive. Patient was started on Tigecycline given allergies to penicillin and flagyl.   CT abdomen and pelvis showed focal mucosal thickening and distention of short segment of distal ileum in the right pelvis with adjacent mesenteric stranding and 2 tiny gas bubbles."  May represent focal areas of infection/inflammatory enteritis, or potential malignancy.  Microperforation cannot be excluded"    Review of Systems:  Constitutional: No Weight Change, + Fever ENT/Mouth: No sore throat, No Rhinorrhea Eyes: No Eye Pain, No Vision Changes Cardiovascular: No Chest Pain, no SOB,  Respiratory: No Cough,  No Sputum, No Wheezing, no Dyspnea  Gastrointestinal:+Nausea, + Vomiting, No Diarrhea, No Constipation, + Pain Genitourinary: no Urinary Incontinence, No Urgency, No Flank Pain Musculoskeletal: No Arthralgias, No Myalgias Skin: No Skin Lesions, No Pruritus, Neuro: no Weakness, No Numbness,  No Loss of Consciousness, No Syncope Psych: No Anxiety/Panic, No Depression, no decrease appetite Heme/Lymph: No Bruising, No Bleeding  Past Medical History:  Diagnosis Date   Depression    Hypertension    IBS (irritable bowel syndrome)    Malignant melanoma (HCC)     Past Surgical History:  Procedure Laterality Date   BLADDER REPAIR     FOOT SURGERY Bilateral    HERNIA REPAIR     MELANOMA EXCISION     VAGINAL HYSTERECTOMY       reports that she has never smoked. She has never used smokeless tobacco. She reports that she does not drink alcohol or use drugs.  Allergies  Allergen Reactions   Codeine Nausea And Vomiting   Penicillins Swelling   Flagyl [Metronidazole] Hives   Sulfa Antibiotics Nausea And Vomiting    Family History  Problem Relation Age of Onset   Heart attack Mother    Hypertension Mother    Cancer Father    Diabetes Father    Hypertension Father    : Family history reviewed and not pertinent   Prior to Admission medications   Medication Sig Start Date End Date Taking? Authorizing Provider  Acetaminophen (TYLENOL PO) Take 2 tablets by mouth daily as needed (for pain or headache).   Yes [provider]  ALPRAZolam Duanne Moron) 1 MG tablet Take 2 mg by mouth 2 (two) times daily  as needed for anxiety.  05/16/14  Yes [provider]  aspirin EC 81 MG tablet Take 81 mg by mouth at bedtime.   Yes [provider]  atorvastatin (LIPITOR) 40 MG tablet Take 40 mg by mouth daily at 6 PM.  01/27/19  Yes [provider]  cyclobenzaprine (FLEXERIL) 5 MG tablet Take 5 mg by mouth 3 (three) times daily as needed for muscle spasms.   02/15/19  Yes [provider]  hydrochlorothiazide (HYDRODIURIL) 25 MG tablet Take 25 mg by mouth daily. 05/11/14  Yes [provider]  Ketotifen Fumarate (ALAWAY OP) Place 5 drops into both eyes daily as needed (for allergies).   Yes [provider]  levothyroxine (SYNTHROID) 50 MCG tablet Take 50 mcg by mouth daily before breakfast.  02/13/19  Yes [provider]  meloxicam (MOBIC) 15 MG tablet Take 15 mg by mouth daily as needed for pain.  02/15/19  Yes [provider]  metoprolol tartrate (LOPRESSOR) 50 MG tablet Take 50 mg by mouth 2 (two) times daily.  05/11/14  Yes [provider]  montelukast (SINGULAIR) 10 MG tablet Take 1 tablet by mouth daily. 02/03/19  Yes [provider]  PROAIR HFA 108 (90 Base) MCG/ACT inhaler Inhale 2 puffs into the lungs every 4 (four) hours as needed. 02/04/19  Yes [provider]  promethazine (PHENERGAN) 25 MG tablet Take 1 tablet (25 mg total) by mouth every 8 (eight) hours as needed for nausea or vomiting. 01/06/18  Yes Dean, Tonna Corner, MD  zolpidem (AMBIEN) 10 MG tablet Take 10 mg by mouth at bedtime as needed for sleep.  03/22/19  Yes [provider]  benzonatate (TESSALON) 100 MG capsule Take 1-2 capsules (100-200 mg total) by mouth 3 (three) times daily. Patient not taking: Reported on 07/22/2019 12/28/18   Tereasa Coop, PA-C  conjugated estrogens (PREMARIN) vaginal cream Place 1 Applicatorful vaginally once a week.    [provider]  hydrocortisone valerate ointment (WESTCORT) 0.2 % Apply 1 application topically 2 (two) times daily. Patient not taking: Reported on 07/22/2019 08/05/15   Kinnie Feil, MD  HYDROmorphone (DILAUDID) 2 MG tablet Take 0.5 tablets (1 mg total) by mouth every 12 (twelve) hours as needed for severe pain. Patient not taking: Reported on 07/22/2019 01/21/18   Meredith Pel, MD  ibuprofen (ADVIL,MOTRIN) 200 MG tablet Take 3 tablets (600 mg  total) by mouth every 4 (four) hours as needed for fever, headache or mild pain. Patient not taking: Reported on 07/22/2019 05/30/14   Debbe Odea, MD    Physical Exam: Vitals:   07/22/19 2152 07/22/19 2153 07/22/19 2226 07/23/19 0030  BP: (!) 136/51 (!) 136/51 (!) 106/46 118/81  Pulse: 75 72 81 80  Resp: 16 18 18 16   Temp:      TempSrc:      SpO2: 99% 100% 96% 98%  Weight:      Height:        Constitutional: NAD, calm, comfortable laying at a 30 degree incline in bed Vitals:   07/22/19 2152 07/22/19 2153 07/22/19 2226 07/23/19 0030  BP: (!) 136/51 (!) 136/51 (!) 106/46 118/81  Pulse: 75 72 81 80  Resp: 16 18 18 16   Temp:      TempSrc:      SpO2: 99% 100% 96% 98%  Weight:      Height:       Eyes: PERRL, lids and conjunctivae normal ENMT: Mucous membranes are moist. Posterior pharynx clear of  any exudate or lesions.Normal dentition.  Neck: normal, supple, no masses Respiratory: clear to auscultation bilaterally, no wheezing, no crackles. Normal respiratory effort. No accessory muscle use.  Cardiovascular: Regular rate and rhythm, no murmurs / rubs / gallops. No extremity edema. 2+ pedal pulses. No carotid bruits.  Abdomen: diffuse tenderness worse at epigastric and lower quadrant with light touch, no rebound tenderness or guarding.  No masses palpated.  Bowel sounds positive.  Musculoskeletal: no clubbing / cyanosis. No joint deformity upper and lower extremities. Good ROM, no contractures. Normal muscle tone.  Skin: no rashes, lesions, ulcers. No induration Neurologic: CN 2-12 grossly intact. Sensation intact. Strength 5/5 in all 4.  Psychiatric: Normal judgment and insight. Alert and oriented x 3. Anxious mood.     Labs on Admission: I have personally reviewed following labs and imaging studies  CBC: Recent Labs  Lab 07/22/19 1945  WBC 18.9*  HGB 12.4  HCT 37.7  MCV 94.0  PLT 123456   Basic Metabolic Panel: Recent Labs  Lab 07/22/19 1945  NA 139  K 3.6  CL  106  CO2 23  GLUCOSE 108*  BUN 34*  CREATININE 0.84  CALCIUM 8.7*   GFR: Estimated Creatinine Clearance: 65.5 mL/min (by C-G formula based on SCr of 0.84 mg/dL). Liver Function Tests: Recent Labs  Lab 07/22/19 1945  AST 16  ALT 28  ALKPHOS 69  BILITOT 0.4  PROT 6.2*  ALBUMIN 3.4*   No results for input(s): LIPASE, AMYLASE in the last 168 hours. No results for input(s): AMMONIA in the last 168 hours. Coagulation Profile: No results for input(s): INR, PROTIME in the last 168 hours. Cardiac Enzymes: No results for input(s): CKTOTAL, CKMB, CKMBINDEX, TROPONINI in the last 168 hours. BNP (last 3 results) No results for input(s): PROBNP in the last 8760 hours. HbA1C: No results for input(s): HGBA1C in the last 72 hours. CBG: No results for input(s): GLUCAP in the last 168 hours. Lipid Profile: No results for input(s): CHOL, HDL, LDLCALC, TRIG, CHOLHDL, LDLDIRECT in the last 72 hours. Thyroid Function Tests: No results for input(s): TSH, T4TOTAL, FREET4, T3FREE, THYROIDAB in the last 72 hours. Anemia Panel: No results for input(s): VITAMINB12, FOLATE, FERRITIN, TIBC, IRON, RETICCTPCT in the last 72 hours. Urine analysis:    Component Value Date/Time   COLORURINE YELLOW 05/28/2014 2150   APPEARANCEUR CLEAR 05/28/2014 2150   LABSPEC 1.019 05/28/2014 2150   PHURINE 6.5 05/28/2014 2150   GLUCOSEU NEGATIVE 05/28/2014 2150   HGBUR NEGATIVE 05/28/2014 2150   BILIRUBINUR NEGATIVE 05/28/2014 2150   KETONESUR NEGATIVE 05/28/2014 2150   PROTEINUR NEGATIVE 05/28/2014 2150   UROBILINOGEN 0.2 05/28/2014 2150   NITRITE NEGATIVE 05/28/2014 2150   LEUKOCYTESUR NEGATIVE 05/28/2014 2150    Radiological Exams on Admission: Dg Chest 2 View  Result Date: 07/22/2019 CLINICAL DATA:  Lower abdominal pain with hematemesis and melena. EXAM: CHEST - 2 VIEW COMPARISON:  Chest x-ray dated April 05, 2019. FINDINGS: The heart size and mediastinal contours are within normal limits. Both lungs are  clear. The visualized skeletal structures are unremarkable. Unchanged surgical clips near the gastroesophageal junction. Prior cholecystectomy. IMPRESSION: No active cardiopulmonary disease. Electronically Signed   By: Titus Dubin M.D.   On: 07/22/2019 20:38   Ct Abdomen Pelvis W Contrast  Result Date: 07/22/2019 CLINICAL DATA:  GI bleeding. EXAM: CT ABDOMEN AND PELVIS WITH CONTRAST TECHNIQUE: Multidetector CT imaging of the abdomen and pelvis was performed using the standard protocol following bolus administration of intravenous contrast. CONTRAST:  126mL OMNIPAQUE  IOHEXOL 300 MG/ML  SOLN COMPARISON:  None. FINDINGS: Lower chest: Postsurgical changes at the GE junction. Hepatobiliary: No focal liver abnormality is seen. Status post cholecystectomy. No biliary dilatation. Pancreas: Unremarkable. No pancreatic ductal dilatation or surrounding inflammatory changes. Spleen: Normal in size without focal abnormality. Adrenals/Urinary Tract: Adrenal glands are unremarkable. Kidneys are normal, without renal calculi, focal lesion, or hydronephrosis. Bladder is unremarkable. Stomach/Bowel: Probable prior Nissen fundoplication. No evidence of small-bowel obstruction. Focal mucosal thickening and distension of short segment of distal ileum in the right pelvis with adjacent mesenteric stranding and 2 tiny gas bubbles, image 72/94, axial images and 82/151, coronal images. A cluster of small marked rounded lymph nodes in the adjacent mesentery. Milder circumferential mucosal thickening of the upstream ileum, image 72/94. Vascular/Lymphatic: No significant vascular findings are present. No enlarged abdominal or pelvic lymph nodes. Reproductive: Status post hysterectomy. No adnexal masses. Other: No abdominal wall hernia or abnormality. No abdominopelvic ascites. Musculoskeletal: No acute or significant osseous findings. IMPRESSION: 1. Focal mucosal thickening and distension of short segment of distal ileum in the right  pelvis with adjacent mesenteric stranding and 2 tiny gas bubbles. This may represent a focal area of infectious/inflammatory enteritis, or potentially malignancy. Micro perforation cannot be excluded. Milder circumferential mucosal thickening of the upstream ileum is also present. 2. Cluster of small, few mm, but rounded lymph nodes in the adjacent mesentery. 3. No evidence of small-bowel obstruction. 4. Postsurgical changes at the GE junction. 5. No evidence of acute abnormalities within the solid abdominal organs. Electronically Signed   By: Fidela Salisbury M.D.   On: 07/22/2019 22:01    EKG: Independently reviewed.   Assessment/Plan Melena, hematemesis secondary to infectious colitis -CT abdomen and pelvis showed focal mucosal thickening and distention of short segment of distal ileum in the right pelvis with adjacent mesenteric stranding and 2 tiny gas bubbles."  -Discussed CT findings briefly with on call surgeon regarding concerns of perforation and she recommend monitoring on IV antibiotics.  -abdominal exam was reassuring without any rebound tenderness or guarding - ID consult to continue Tigecycline- suggested by pharmach secondary to allergies to PCN and Flagyl - Hemoglobin is stable. Will trend q6hr and monitor for any active bleed.  - IV PPI - IV NS fluid - consult GI especially for hememesis given abdominal procedures in the past   Hypertension  -Continue HCTZ, metoprolol  Anxiety -Continue home Ambien as needed for sleep  Hypothyroidism -Continue levothyroxine  DVT prophylaxis:SCD Code Status:Full Family Communication: Plan discussed with patient and husband at bedside  disposition Plan: Home with at least 2 midnight stays  Consults called: GI, ID Admission status: inpatient   Mary-Ann Pennella T Tuwanda Vokes DO [facility]   If 7PM-7AM, please contact night-coverage [email or web address] Password [email or web address]  07/23/2019, 12:39 AM

## 2019-07-23 NOTE — Progress Notes (Signed)
Spoke with patient and informed her that I had spoken with the [facility] about transferring her to [facility] as she had requested and that the [facility] would work on the transfer.

## 2019-07-23 NOTE — ED Notes (Signed)
This NT transported pt to [facility] for admission from ED. When pt arrived on unit, pt asked what unit she was on and writer explained it was 3[facility]. Pt stated she did not want to be admitted to 3-west and only wanted to be admitted to [facility] due to her daughter working on [facility]. This was all explained to the oncoming RN Pam on Erskine Squibb, RN assumed care and stated she would speak with the [facility], Misha, RN to see if there was anything to make the pt's stay more pleasant.  ED Charge RN Maylon Cos and primary ED RNs Lovena Le and Joi made aware.

## 2019-07-23 NOTE — Progress Notes (Signed)
Pharmacy Antibiotic Note  [name] is a 73 y.o. female admitted on 07/22/2019 with Intra-abdominal infection.  Pharmacy has been consulted for Tigecycline dosing.  Patient with allergy to penicillins and flagyl.  Only antibiotics noted in Epic was prior clindamycin.  Plan: Tigecycline 100mg  iv x1. ID to be consulted and decide if medication continued or different medication used.   Height: 5\' 5"  (165.1 cm) Weight: 195 lb (88.5 kg) IBW/kg (Calculated) : 57  Temp (24hrs), Avg:98.9 F (37.2 C), Min:98.2 F (36.8 C), Max:99.6 F (37.6 C)  Recent Labs  Lab 07/22/19 1945 07/23/19 0328  WBC 18.9* 17.0*  CREATININE 0.84  --     Estimated Creatinine Clearance: 65.5 mL/min (by C-G formula based on SCr of 0.84 mg/dL).    Allergies  Allergen Reactions  . Codeine Nausea And Vomiting  . Penicillins Swelling  . Flagyl [Metronidazole] Hives  . Sulfa Antibiotics Nausea And Vomiting    Antimicrobials this admission: Ciprofloxacin 07/23/2019 x1 Tigecycline 07/23/2019 x1   Dose adjustments this admission: -  Microbiology results: -  Thank you for allowing pharmacy to be a part of this patient's care.  Nani Skillern Crowford 07/23/2019 5:01 AM

## 2019-07-23 NOTE — H&P (View-Only) (Signed)
Referring Provider: Dr. Nevada Crane Primary Care Physician:  Alroy Dust, Carlean Jews.Marlou Sa, MD Primary Gastroenterologist:  Althia Forts  Reason for Consultation:  Melena  HPI: [name] is a 73 y.o. female s/p Nissen fundoplication years ago who started having crampy and sharp diffuse abdominal pain along with several episodes of black tarry stools that day. That night started having retching and dry heaves and had red colored fluid mixed in her saliva but she and her husband deny that she vomited. 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 Denies previous episodes of black stools. No BMs since admit. Takes Aleve prn. CT scan shows inflammation of a short segment of the distal ileum. Reports having a colonoscopy 3-5 years ago by Dr. Watt Climes but records not available at this time. History of melanoma. Denies alcohol. Husband at bedside.   Past Medical History:  Diagnosis Date  . Depression   . Hypertension   . IBS (irritable bowel syndrome)   . Malignant melanoma ([name])     Past Surgical History:  Procedure Laterality Date  . BLADDER REPAIR    . FOOT SURGERY Bilateral   . HERNIA REPAIR    . MELANOMA EXCISION    . VAGINAL HYSTERECTOMY      Prior to Admission medications   Medication Sig Start Date End Date Taking? Authorizing Provider  Acetaminophen (TYLENOL PO) Take 2 tablets by mouth daily as needed (for pain or headache).   Yes [provider]  ALPRAZolam Duanne Moron) 1 MG tablet Take 2 mg by mouth 2 (two) times daily as needed for anxiety.  05/16/14  Yes [provider]  aspirin EC 81 MG tablet Take 81 mg by mouth at bedtime.   Yes [provider]  atorvastatin (LIPITOR) 40 MG tablet Take 40 mg by mouth daily at 6 PM.  01/27/19  Yes [provider]  cyclobenzaprine (FLEXERIL) 5 MG tablet Take 5 mg by mouth 3 (three) times daily as needed for muscle spasms.  02/15/19  Yes [provider]  hydrochlorothiazide  (HYDRODIURIL) 25 MG tablet Take 25 mg by mouth daily. 05/11/14  Yes [provider]  Ketotifen Fumarate (ALAWAY OP) Place 5 drops into both eyes daily as needed (for allergies).   Yes [provider]  levothyroxine (SYNTHROID) 50 MCG tablet Take 50 mcg by mouth daily before breakfast.  02/13/19  Yes [provider]  meloxicam (MOBIC) 15 MG tablet Take 15 mg by mouth daily as needed for pain.  02/15/19  Yes [provider]  metoprolol tartrate (LOPRESSOR) 50 MG tablet Take 50 mg by mouth 2 (two) times daily.  05/11/14  Yes [provider]  montelukast (SINGULAIR) 10 MG tablet Take 1 tablet by mouth daily. 02/03/19  Yes [provider]  PROAIR HFA 108 (90 Base) MCG/ACT inhaler Inhale 2 puffs into the lungs every 4 (four) hours as needed. 02/04/19  Yes [provider]  promethazine (PHENERGAN) 25 MG tablet Take 1 tablet (25 mg total) by mouth every 8 (eight) hours as needed for nausea or vomiting. 01/06/18  Yes Dean, Tonna Corner, MD  zolpidem (AMBIEN) 10 MG tablet Take 10 mg by mouth at bedtime as needed for sleep.  03/22/19  Yes [provider]  benzonatate (TESSALON) 100 MG capsule Take 1-2 capsules (100-200 mg total) by mouth 3 (three) times daily. Patient not taking: Reported on 07/22/2019 12/28/18   Tereasa Coop, PA-C  conjugated estrogens (PREMARIN) vaginal cream Place 1 Applicatorful vaginally once a week.  [provider]  hydrocortisone valerate ointment (WESTCORT) 0.2 % Apply 1 application topically 2 (two) times daily. Patient not taking: Reported on 07/22/2019 08/05/15   Kinnie Feil, MD  HYDROmorphone (DILAUDID) 2 MG tablet Take 0.5 tablets (1 mg total) by mouth every 12 (twelve) hours as needed for severe pain. Patient not taking: Reported on 07/22/2019 01/21/18   Meredith Pel, MD  ibuprofen (ADVIL,MOTRIN) 200 MG tablet Take 3 tablets (600 mg total) by mouth every 4 (four) hours as needed for fever,  headache or mild pain. Patient not taking: Reported on 07/22/2019 05/30/14   Debbe Odea, MD    Scheduled Meds: . atorvastatin  40 mg Oral q1800  . enoxaparin (LOVENOX) injection  40 mg Subcutaneous Q24H  . hydrochlorothiazide  25 mg Oral Daily  . levothyroxine  50 mcg Oral Q0600  . metoprolol tartrate  50 mg Oral BID  . montelukast  10 mg Oral Daily  . ondansetron (ZOFRAN) IV  4 mg Intravenous Q8H  . pantoprazole (PROTONIX) IV  40 mg Intravenous QHS   Continuous Infusions: . sodium chloride 50 mL/hr at 07/23/19 1740  . meropenem (MERREM) IV Stopped (07/23/19 1520)   PRN Meds:.ALPRAZolam, oxyCODONE, promethazine, traMADol, zolpidem  Allergies as of 07/22/2019 - Review Complete 07/22/2019  Allergen Reaction Noted  . Codeine Nausea And Vomiting 05/28/2014  . Penicillins Swelling 05/28/2014  . Flagyl [metronidazole] Hives 05/28/2014  . Sulfa antibiotics Nausea And Vomiting 05/28/2014    Family History  Problem Relation Age of Onset  . Heart attack Mother   . Hypertension Mother   . Cancer Father   . Diabetes Father   . Hypertension Father     Social History   Socioeconomic History  . Marital status: Married    Spouse name: [name]  . Number of children: 2  . Years of education: 12th  . Highest education level: Not on file  Occupational History  . Occupation: retired  Scientific laboratory technician  . Financial resource strain: Not on file  . Food insecurity    Worry: Not on file    Inability: Not on file  . Transportation needs    Medical: Not on file    Non-medical: Not on file  Tobacco Use  . Smoking status: Never Smoker  . Smokeless tobacco: Never Used  Substance and Sexual Activity  . Alcohol use: No  . Drug use: No  . Sexual activity: Not on file  Lifestyle  . Physical activity    Days per week: Not on file    Minutes per session: Not on file  . Stress: Not on file  Relationships  . Social Herbalist on phone: Not on file    Gets together: Not on file     Attends religious service: Not on file    Active member of club or organization: Not on file    Attends meetings of clubs or organizations: Not on file    Relationship status: Not on file  . Intimate partner violence    Fear of current or ex partner: Not on file    Emotionally abused: Not on file    Physically abused: Not on file    Forced sexual activity: Not on file  Other Topics Concern  . Not on file  Social History Narrative   Patient lives at home with her spouse.   Caffeine Use: 1-2 cups daily    Review of Systems: All negative except as stated above in HPI.  Physical Exam: Vital  signs: Vitals:   07/23/19 0556 07/23/19 1402  BP: (!) 137/51 114/72  Pulse: (!) 59 (!) 51  Resp: 15 16  Temp: 98.2 F (36.8 C) 98.1 F (36.7 C)  SpO2: 96% 97%   Last BM Date: 07/23/19 General:  Lethargic, elderly, well-nourished, pleasant and cooperative in NAD Head: normocephalic, atraumatic Eyes: anicteric sclera ENT: oropharynx clear Neck: supple, nontender Lungs:  Clear throughout to auscultation.   No wheezes, crackles, or rhonchi. No acute distress. Heart:  Regular rate and rhythm; no murmurs, clicks, rubs,  or gallops. Abdomen: diffuse tenderness with guarding, soft, nondistended, +BS  Rectal:  Deferred Ext: no edema  GI:  Lab Results: Recent Labs    07/23/19 0328 07/23/19 0643 07/23/19 1236  WBC 17.0* 14.3* 13.8*  HGB 11.9* 11.3* 10.6*  HCT 37.1 34.6* 33.0*  PLT 237 236 225   BMET Recent Labs    07/22/19 1945 07/23/19 0328  NA 139 139  K 3.6 3.6  CL 106 106  CO2 23 21*  GLUCOSE 108* 106*  BUN 34* 31*  CREATININE 0.84 0.93  CALCIUM 8.7* 8.2*   LFT Recent Labs    07/22/19 1945  PROT 6.2*  ALBUMIN 3.4*  AST 16  ALT 28  ALKPHOS 69  BILITOT 0.4   PT/INR No results for input(s): LABPROT, INR in the last 72 hours.   Studies/Results: Dg Chest 2 View  Result Date: 07/22/2019 CLINICAL DATA:  Lower abdominal pain with hematemesis and melena. EXAM:  CHEST - 2 VIEW COMPARISON:  Chest x-ray dated April 05, 2019. FINDINGS: The heart size and mediastinal contours are within normal limits. Both lungs are clear. The visualized skeletal structures are unremarkable. Unchanged surgical clips near the gastroesophageal junction. Prior cholecystectomy. IMPRESSION: No active cardiopulmonary disease. Electronically Signed   By: Titus Dubin M.D.   On: 07/22/2019 20:38   Ct Abdomen Pelvis W Contrast  Result Date: 07/22/2019 CLINICAL DATA:  GI bleeding. EXAM: CT ABDOMEN AND PELVIS WITH CONTRAST TECHNIQUE: Multidetector CT imaging of the abdomen and pelvis was performed using the standard protocol following bolus administration of intravenous contrast. CONTRAST:  121mL OMNIPAQUE IOHEXOL 300 MG/ML  SOLN COMPARISON:  None. FINDINGS: Lower chest: Postsurgical changes at the GE junction. Hepatobiliary: No focal liver abnormality is seen. Status post cholecystectomy. No biliary dilatation. Pancreas: Unremarkable. No pancreatic ductal dilatation or surrounding inflammatory changes. Spleen: Normal in size without focal abnormality. Adrenals/Urinary Tract: Adrenal glands are unremarkable. Kidneys are normal, without renal calculi, focal lesion, or hydronephrosis. Bladder is unremarkable. Stomach/Bowel: Probable prior Nissen fundoplication. No evidence of small-bowel obstruction. Focal mucosal thickening and distension of short segment of distal ileum in the right pelvis with adjacent mesenteric stranding and 2 tiny gas bubbles, image 72/94, axial images and 82/151, coronal images. A cluster of small marked rounded lymph nodes in the adjacent mesentery. Milder circumferential mucosal thickening of the upstream ileum, image 72/94. Vascular/Lymphatic: No significant vascular findings are present. No enlarged abdominal or pelvic lymph nodes. Reproductive: Status post hysterectomy. No adnexal masses. Other: No abdominal wall hernia or abnormality. No abdominopelvic ascites.  Musculoskeletal: No acute or significant osseous findings. IMPRESSION: 1. Focal mucosal thickening and distension of short segment of distal ileum in the right pelvis with adjacent mesenteric stranding and 2 tiny gas bubbles. This may represent a focal area of infectious/inflammatory enteritis, or potentially malignancy. Micro perforation cannot be excluded. Milder circumferential mucosal thickening of the upstream ileum is also present. 2. Cluster of small, few mm, but rounded lymph nodes in the adjacent mesentery.  3. No evidence of small-bowel obstruction. 4. Postsurgical changes at the GE junction. 5. No evidence of acute abnormalities within the solid abdominal organs. Electronically Signed   By: Fidela Salisbury M.D.   On: 07/22/2019 22:01   Dg Abd 2 Views  Result Date: 07/23/2019 CLINICAL DATA:  Upper abdominal pain. EXAM: ABDOMEN - 2 VIEW COMPARISON:  None. FINDINGS: The bowel gas pattern is normal. There is no evidence of free air. No radio-opaque calculi or other significant radiographic abnormality is seen. IMPRESSION: Negative. Electronically Signed   By: Marijo Conception M.D.   On: 07/23/2019 10:44    Impression/Plan: Melena with retching and streaks of blood in saliva during dry heaving. Denies vomiting. S/P Nissen fundoplication years ago. Abdominal pain likely due to enteritis seen on CT. EGD tomorrow to evaluate for peptic ulcer disease with melena. May need a capsule endoscopy if abdominal pain persists despite IV Abx but do not think an updated colonoscopy is needed. Ice chips ok and sips with meds otherwise keep NPO. EGD tomorrow.    LOS: 0 days   Lear Ng  07/23/2019, 6:07 PM  Questions please call [phone]

## 2019-07-23 NOTE — Progress Notes (Signed)
Patient requested pain and nausea medication. Paged floor coverage.

## 2019-07-23 NOTE — Progress Notes (Signed)
[name] is a 73 y.o. female with medical history significant of IBS with alternating constipation and diarrhea, concussion with residual memory decline, malignant melanoma, hypertension, depression who presented with concerns of fatigue, and increasing abdominal pain.  Patient started to have midline abdominal pain yesterday and had diarrhea with dark tarry stool.  Patient has had a history of what appears to be Nissen fundoplication and has a hard time vomiting but was retching and also noticed blood and small clots in her vomit.  She felt warm but could not check her temperature.  She denies any past history of GI bleed.  Reports having a colonoscopy several years ago with no significant finding other than polyps. Patient denies any chest pain, shortness of breath.  ED Course: Patient reportedly was febrile up to 102 ( not documented) and normotensive on room air.  CBC showed leukocytosis of 18.9 with stable hemoglobin of 12.4.  CMP showed elevated BUN of 34 and stable creatinine of 0.84.  Fecal occult blood positive. Patient was started on Tigecycline given allergies to penicillin and flagyl.   07/23/19: Patient was seen and examined at her bedside this morning.  Abdominal pain is persistent 7 out of 10 but improved from presentation with pain management.  Abdomen x-ray ordered this morning and pending.  Nausea is persistent.  IV Phenergan PRN added for intractable nausea and vomiting.  Leukocytosis is persistent at 17 K, blood cultures x2 in process.  Per CT abdomen and pelvis with contrast on 07/22/2019, suspected infectious/inflammatory enteritis or potential malignancy.  Continue tigecycline empirically.  Allergies to penicillin and Flagyl.  Mild anion gap metabolic acidosis with chemistry bicarb 21 and anion gap of 12.  On IV fluid hydration normal saline at 75 cc/h.  Will repeat BMP in the morning.  Please refer to H&P dictated by Dr. Flossie Buffy on 07/23/2019 for further details of the  assessment and plan.

## 2019-07-23 NOTE — Progress Notes (Signed)
Dr. Nevada Crane notified via amion of pt leg swelling and possible fluid overload. She has said this leg pain started yesterday and has been getting worse.

## 2019-07-23 NOTE — Consult Note (Signed)
Referring Provider: Dr. Nevada Dudley Primary Care Physician:  [name], Mary Jews.Mary Sa, MD Primary Gastroenterologist:  [name]  Reason for Consultation:  Melena  HPI: [name] is a 73 y.o. female s/p Nissen fundoplication years ago who started having crampy and sharp diffuse abdominal pain along with several episodes of black tarry stools that day. That night started having retching and dry heaves and had red colored fluid mixed in her saliva but she and her husband deny that she vomited. 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 Denies previous episodes of black stools. No BMs since admit. Takes Aleve prn. CT scan shows inflammation of a short segment of the distal ileum. Reports having a colonoscopy 3-5 years ago by Dr. Watt Dudley but records not available at this time. History of melanoma. Denies alcohol. Husband at bedside.   Past Medical History:  Diagnosis Date  . Depression   . Hypertension   . IBS (irritable bowel syndrome)   . Malignant melanoma ([place])     Past Surgical History:  Procedure Laterality Date  . BLADDER REPAIR    . FOOT SURGERY Bilateral   . HERNIA REPAIR    . MELANOMA EXCISION    . VAGINAL HYSTERECTOMY      Prior to Admission medications   Medication Sig Start Date End Date Taking? Authorizing Provider  Acetaminophen (TYLENOL PO) Take 2 tablets by mouth daily as needed (for pain or headache).   Yes [provider]  ALPRAZolam Duanne Moron) 1 MG tablet Take 2 mg by mouth 2 (two) times daily as needed for anxiety.  05/16/14  Yes [provider]  aspirin EC 81 MG tablet Take 81 mg by mouth at bedtime.   Yes [provider]  atorvastatin (LIPITOR) 40 MG tablet Take 40 mg by mouth daily at 6 PM.  01/27/19  Yes [provider]  cyclobenzaprine (FLEXERIL) 5 MG tablet Take 5 mg by mouth 3 (three) times daily as needed for muscle spasms.  02/15/19  Yes [provider]  hydrochlorothiazide  (HYDRODIURIL) 25 MG tablet Take 25 mg by mouth daily. 05/11/14  Yes [provider]  Ketotifen Fumarate (ALAWAY OP) Place 5 drops into both eyes daily as needed (for allergies).   Yes [provider]  levothyroxine (SYNTHROID) 50 MCG tablet Take 50 mcg by mouth daily before breakfast.  02/13/19  Yes [provider]  meloxicam (MOBIC) 15 MG tablet Take 15 mg by mouth daily as needed for pain.  02/15/19  Yes [provider]  metoprolol tartrate (LOPRESSOR) 50 MG tablet Take 50 mg by mouth 2 (two) times daily.  05/11/14  Yes [provider]  montelukast (SINGULAIR) 10 MG tablet Take 1 tablet by mouth daily. 02/03/19  Yes [provider]  PROAIR HFA 108 (90 Base) MCG/ACT inhaler Inhale 2 puffs into the lungs every 4 (four) hours as needed. 02/04/19  Yes [provider]  promethazine (PHENERGAN) 25 MG tablet Take 1 tablet (25 mg total) by mouth every 8 (eight) hours as needed for nausea or vomiting. 01/06/18  Yes Dean, Tonna Corner, MD  zolpidem (AMBIEN) 10 MG tablet Take 10 mg by mouth at bedtime as needed for sleep.  03/22/19  Yes [provider]  benzonatate (TESSALON) 100 MG capsule Take 1-2 capsules (100-200 mg total) by mouth 3 (three) times daily. Patient not taking: Reported on 07/22/2019 12/28/18   Tereasa Coop, PA-C  conjugated estrogens (PREMARIN) vaginal cream Place 1 Applicatorful vaginally once a week.  [provider]  hydrocortisone valerate ointment (WESTCORT) 0.2 % Apply 1 application topically 2 (two) times daily. Patient not taking: Reported on 07/22/2019 08/05/15   Kinnie Feil, MD  HYDROmorphone (DILAUDID) 2 MG tablet Take 0.5 tablets (1 mg total) by mouth every 12 (twelve) hours as needed for severe pain. Patient not taking: Reported on 07/22/2019 01/21/18   Meredith Pel, MD  ibuprofen (ADVIL,MOTRIN) 200 MG tablet Take 3 tablets (600 mg total) by mouth every 4 (four) hours as needed for fever,  headache or mild pain. Patient not taking: Reported on 07/22/2019 05/30/14   Debbe Odea, MD    Scheduled Meds: . atorvastatin  40 mg Oral q1800  . enoxaparin (LOVENOX) injection  40 mg Subcutaneous Q24H  . hydrochlorothiazide  25 mg Oral Daily  . levothyroxine  50 mcg Oral Q0600  . metoprolol tartrate  50 mg Oral BID  . montelukast  10 mg Oral Daily  . ondansetron (ZOFRAN) IV  4 mg Intravenous Q8H  . pantoprazole (PROTONIX) IV  40 mg Intravenous QHS   Continuous Infusions: . sodium chloride 50 mL/hr at 07/23/19 1740  . meropenem (MERREM) IV Stopped (07/23/19 1520)   PRN Meds:.ALPRAZolam, oxyCODONE, promethazine, traMADol, zolpidem  Allergies as of 07/22/2019 - Review Complete 07/22/2019  Allergen Reaction Noted  . Codeine Nausea And Vomiting 05/28/2014  . Penicillins Swelling 05/28/2014  . Flagyl [metronidazole] Hives 05/28/2014  . Sulfa antibiotics Nausea And Vomiting 05/28/2014    Family History  Problem Relation Age of Onset  . Heart attack Mother   . Hypertension Mother   . Cancer Father   . Diabetes Father   . Hypertension Father     Social History   Socioeconomic History  . Marital status: Married    Spouse name: [name]  . Number of children: 2  . Years of education: 12th  . Highest education level: Not on file  Occupational History  . Occupation: retired  Scientific laboratory technician  . Financial resource strain: Not on file  . Food insecurity    Worry: Not on file    Inability: Not on file  . Transportation needs    Medical: Not on file    Non-medical: Not on file  Tobacco Use  . Smoking status: Never Smoker  . Smokeless tobacco: Never Used  Substance and Sexual Activity  . Alcohol use: No  . Drug use: No  . Sexual activity: Not on file  Lifestyle  . Physical activity    Days per week: Not on file    Minutes per session: Not on file  . Stress: Not on file  Relationships  . Social Herbalist on phone: Not on file    Gets together: Not on file     Attends religious service: Not on file    Active member of club or organization: Not on file    Attends meetings of clubs or organizations: Not on file    Relationship status: Not on file  . Intimate partner violence    Fear of current or ex partner: Not on file    Emotionally abused: Not on file    Physically abused: Not on file    Forced sexual activity: Not on file  Other Topics Concern  . Not on file  Social History Narrative   Patient lives at home with her spouse.   Caffeine Use: 1-2 cups daily    Review of Systems: All negative except as stated above in HPI.  Physical Exam: Vital  signs: Vitals:   07/23/19 0556 07/23/19 1402  BP: (!) 137/51 114/72  Pulse: (!) 59 (!) 51  Resp: 15 16  Temp: 98.2 F (36.8 C) 98.1 F (36.7 C)  SpO2: 96% 97%   Last BM Date: 07/23/19 General:  Lethargic, elderly, well-nourished, pleasant and cooperative in NAD Head: normocephalic, atraumatic Eyes: anicteric sclera ENT: oropharynx clear Neck: supple, nontender Lungs:  Clear throughout to auscultation.   No wheezes, crackles, or rhonchi. No acute distress. Heart:  Regular rate and rhythm; no murmurs, clicks, rubs,  or gallops. Abdomen: diffuse tenderness with guarding, soft, nondistended, +BS  Rectal:  Deferred Ext: no edema  GI:  Lab Results: Recent Labs    07/23/19 0328 07/23/19 0643 07/23/19 1236  WBC 17.0* 14.3* 13.8*  HGB 11.9* 11.3* 10.6*  HCT 37.1 34.6* 33.0*  PLT 237 236 225   BMET Recent Labs    07/22/19 1945 07/23/19 0328  NA 139 139  K 3.6 3.6  CL 106 106  CO2 23 21*  GLUCOSE 108* 106*  BUN 34* 31*  CREATININE 0.84 0.93  CALCIUM 8.7* 8.2*   LFT Recent Labs    07/22/19 1945  PROT 6.2*  ALBUMIN 3.4*  AST 16  ALT 28  ALKPHOS 69  BILITOT 0.4   PT/INR No results for input(s): LABPROT, INR in the last 72 hours.   Studies/Results: Dg Chest 2 View  Result Date: 07/22/2019 CLINICAL DATA:  Lower abdominal pain with hematemesis and melena. EXAM:  CHEST - 2 VIEW COMPARISON:  Chest x-ray dated April 05, 2019. FINDINGS: The heart size and mediastinal contours are within normal limits. Both lungs are clear. The visualized skeletal structures are unremarkable. Unchanged surgical clips near the gastroesophageal junction. Prior cholecystectomy. IMPRESSION: No active cardiopulmonary disease. Electronically Signed   By: Titus Dubin M.D.   On: 07/22/2019 20:38   Ct Abdomen Pelvis W Contrast  Result Date: 07/22/2019 CLINICAL DATA:  GI bleeding. EXAM: CT ABDOMEN AND PELVIS WITH CONTRAST TECHNIQUE: Multidetector CT imaging of the abdomen and pelvis was performed using the standard protocol following bolus administration of intravenous contrast. CONTRAST:  1107mL OMNIPAQUE IOHEXOL 300 MG/ML  SOLN COMPARISON:  None. FINDINGS: Lower chest: Postsurgical changes at the GE junction. Hepatobiliary: No focal liver abnormality is seen. Status post cholecystectomy. No biliary dilatation. Pancreas: Unremarkable. No pancreatic ductal dilatation or surrounding inflammatory changes. Spleen: Normal in size without focal abnormality. Adrenals/Urinary Tract: Adrenal glands are unremarkable. Kidneys are normal, without renal calculi, focal lesion, or hydronephrosis. Bladder is unremarkable. Stomach/Bowel: Probable prior Nissen fundoplication. No evidence of small-bowel obstruction. Focal mucosal thickening and distension of short segment of distal ileum in the right pelvis with adjacent mesenteric stranding and 2 tiny gas bubbles, image 72/94, axial images and 82/151, coronal images. A cluster of small marked rounded lymph nodes in the adjacent mesentery. Milder circumferential mucosal thickening of the upstream ileum, image 72/94. Vascular/Lymphatic: No significant vascular findings are present. No enlarged abdominal or pelvic lymph nodes. Reproductive: Status post hysterectomy. No adnexal masses. Other: No abdominal wall hernia or abnormality. No abdominopelvic ascites.  Musculoskeletal: No acute or significant osseous findings. IMPRESSION: 1. Focal mucosal thickening and distension of short segment of distal ileum in the right pelvis with adjacent mesenteric stranding and 2 tiny gas bubbles. This may represent a focal area of infectious/inflammatory enteritis, or potentially malignancy. Micro perforation cannot be excluded. Milder circumferential mucosal thickening of the upstream ileum is also present. 2. Cluster of small, few mm, but rounded lymph nodes in the adjacent mesentery.  3. No evidence of small-bowel obstruction. 4. Postsurgical changes at the GE junction. 5. No evidence of acute abnormalities within the solid abdominal organs. Electronically Signed   By: Fidela Salisbury M.D.   On: 07/22/2019 22:01   Dg Abd 2 Views  Result Date: 07/23/2019 CLINICAL DATA:  Upper abdominal pain. EXAM: ABDOMEN - 2 VIEW COMPARISON:  None. FINDINGS: The bowel gas pattern is normal. There is no evidence of free air. No radio-opaque calculi or other significant radiographic abnormality is seen. IMPRESSION: Negative. Electronically Signed   By: Marijo Conception M.D.   On: 07/23/2019 10:44    Impression/Plan: Melena with retching and streaks of blood in saliva during dry heaving. Denies vomiting. S/P Nissen fundoplication years ago. Abdominal pain likely due to enteritis seen on CT. EGD tomorrow to evaluate for peptic ulcer disease with melena. May need a capsule endoscopy if abdominal pain persists despite IV Abx but do not think an updated colonoscopy is needed. Ice chips ok and sips with meds otherwise keep NPO. EGD tomorrow.    LOS: 0 days   Lear Ng  07/23/2019, 6:07 PM  Questions please call [phone]

## 2019-07-23 NOTE — Plan of Care (Signed)
  Problem: Clinical Measurements: Goal: Cardiovascular complication will be avoided Outcome: Progressing   Problem: Activity: Goal: Risk for activity intolerance will decrease Outcome: Progressing   Problem: Coping: Goal: Level of anxiety will decrease Outcome: Progressing   Problem: Elimination: Goal: Will not experience complications related to bowel motility Outcome: Progressing

## 2019-07-24 ENCOUNTER — Inpatient Hospital Stay (HOSPITAL_COMMUNITY): Payer: PPO | Admitting: Certified Registered"

## 2019-07-24 ENCOUNTER — Inpatient Hospital Stay (HOSPITAL_COMMUNITY): Payer: PPO

## 2019-07-24 ENCOUNTER — Encounter (HOSPITAL_COMMUNITY): Payer: Self-pay

## 2019-07-24 ENCOUNTER — Encounter (HOSPITAL_COMMUNITY)
Admission: EM | Disposition: A | Discharge status: Home / Self Care | Payer: Self-pay | Source: Home / Self Care | Attending: Internal Medicine

## 2019-07-24 DIAGNOSIS — R52 Pain, unspecified: Secondary | ICD-10-CM

## 2019-07-24 HISTORY — PX: ESOPHAGOGASTRODUODENOSCOPY (EGD) WITH PROPOFOL: SHX5813

## 2019-07-24 LAB — CBC
HCT: 33.3 % — ABNORMAL LOW (ref 36.0–46.0)
Hemoglobin: 10.5 g/dL — ABNORMAL LOW (ref 12.0–15.0)
MCH: 30.6 pg (ref 26.0–34.0)
MCHC: 31.5 g/dL (ref 30.0–36.0)
MCV: 97.1 fL (ref 80.0–100.0)
Platelets: 234 10*3/uL (ref 150–400)
RBC: 3.43 MIL/uL — ABNORMAL LOW (ref 3.87–5.11)
RDW: 13.6 % (ref 11.5–15.5)
WBC: 11.7 10*3/uL — ABNORMAL HIGH (ref 4.0–10.5)
nRBC: 0 % (ref 0.0–0.2)

## 2019-07-24 LAB — BASIC METABOLIC PANEL
Anion gap: 9 (ref 5–15)
BUN: 21 mg/dL (ref 8–23)
CO2: 25 mmol/L (ref 22–32)
Calcium: 7.8 mg/dL — ABNORMAL LOW (ref 8.9–10.3)
Chloride: 104 mmol/L (ref 98–111)
Creatinine, Ser: 0.86 mg/dL (ref 0.44–1.00)
GFR calc Af Amer: >60 mL/min (ref >60)
GFR calc non Af Amer: >60 mL/min (ref >60)
Glucose, Bld: 88 mg/dL (ref 70–99)
Potassium: 3.6 mmol/L (ref 3.5–5.1)
Sodium: 138 mmol/L (ref 135–145)

## 2019-07-24 SURGERY — ESOPHAGOGASTRODUODENOSCOPY (EGD) WITH PROPOFOL
Anesthesia: Monitor Anesthesia Care

## 2019-07-24 MED ORDER — ACETAMINOPHEN 325 MG PO TABS
650.0000 mg | ORAL_TABLET | Freq: Four times a day (QID) | ORAL | Status: DC | PRN
  Administered 2019-07-26 (×2): 650 mg via ORAL
  Filled 2019-07-24 (×2): qty 2

## 2019-07-24 MED ORDER — PROPOFOL 500 MG/50ML IV EMUL
INTRAVENOUS | Status: DC | PRN
  Administered 2019-07-24: 135 ug/kg/min via INTRAVENOUS

## 2019-07-24 MED ORDER — PROPOFOL 10 MG/ML IV BOLUS
INTRAVENOUS | Status: DC | PRN
  Administered 2019-07-24 (×4): 10 mg via INTRAVENOUS

## 2019-07-24 MED ORDER — LACTATED RINGERS IV SOLN
INTRAVENOUS | Status: DC
  Administered 2019-07-24: 10:00:00 via INTRAVENOUS

## 2019-07-24 MED ORDER — PROMETHAZINE HCL 25 MG PO TABS
12.5000 mg | ORAL_TABLET | Freq: Four times a day (QID) | ORAL | Status: DC | PRN
  Administered 2019-07-24 – 2019-07-25 (×3): 12.5 mg via ORAL
  Filled 2019-07-24 (×3): qty 1

## 2019-07-24 MED ORDER — SODIUM CHLORIDE 0.9 % IV SOLN
INTRAVENOUS | Status: DC
  Administered 2019-07-24 – 2019-07-26 (×3): via INTRAVENOUS

## 2019-07-24 MED ORDER — PANTOPRAZOLE SODIUM 40 MG PO TBEC
40.0000 mg | DELAYED_RELEASE_TABLET | Freq: Every day | ORAL | Status: DC
  Administered 2019-07-24 – 2019-07-27 (×4): 40 mg via ORAL
  Filled 2019-07-24 (×4): qty 1

## 2019-07-24 SURGICAL SUPPLY — 15 items

## 2019-07-24 NOTE — Op Note (Signed)
[facility] Patient Name: [name] Procedure Date: 07/24/2019 MRN: [id number] Attending MD: Lear Ng , MD Date of Birth: 07/26/1946 CSN: [id number] Age: 73 Admit Type: Inpatient Procedure:                Upper GI endoscopy Indications:              Suspected upper gastrointestinal bleeding, Melena Providers:                Lear Ng, MD, Grace Isaac, RN, Elspeth Cho Tech., Technician Referring MD:             hospital team Medicines:                Propofol per Anesthesia, Monitored Anesthesia Care Complications:            No immediate complications. Estimated Blood Loss:     Estimated blood loss: none. Procedure:                Pre-Anesthesia Assessment:                           - Prior to the procedure, a History and Physical                            was performed, and patient medications and                            allergies were reviewed. The patient's tolerance of                            previous anesthesia was also reviewed. The risks                            and benefits of the procedure and the sedation                            options and risks were discussed with the patient.                            All questions were answered, and informed consent                            was obtained. Prior Anticoagulants: The patient has                            taken no previous anticoagulant or antiplatelet                            agents. ASA Grade Assessment: II - A patient with                            mild systemic disease. After reviewing the risks  and benefits, the patient was deemed in                            satisfactory condition to undergo the procedure.                           After obtaining informed consent, the endoscope was                            passed under direct vision. Throughout the                            procedure, the patient's  blood pressure, pulse, and                            oxygen saturations were monitored continuously. The                            [id number]-[id number]) Olympus gastroscope was                            introduced through the mouth, and advanced to the                            second part of duodenum. The upper GI endoscopy was                            accomplished without difficulty. The patient                            tolerated the procedure well. Scope In: Scope Out: Findings:      There is no endoscopic evidence of ulcerations in the entire esophagus.      The Z-line was regular and was found 36 cm from the incisors.      One non-bleeding superficial gastric ulcer with no stigmata of bleeding       was found in the prepyloric region of the stomach. The lesion was 3 mm       in largest dimension.      Localized mild inflammation characterized by congestion (edema) and       erythema was found in the gastric antrum.      The cardia and gastric fundus were normal on retroflexion.      The examined duodenum was normal.      Focal red spot in distal esophagus (non-specific). Impression:               - Z-line regular, 36 cm from the incisors.                           - Non-bleeding gastric ulcer with no stigmata of                            bleeding.                           - Acute gastritis.                           -  Normal examined duodenum.                           - No specimens collected. Moderate Sedation:      Not Applicable - Patient had care per Anesthesia. Recommendation:           - Full liquid diet.                           - Observe patient's clinical course. Procedure Code(s):        --- Professional ---                           [phone], Esophagogastroduodenoscopy, flexible,                            transoral; diagnostic, including collection of                            specimen(s) by brushing or washing, when performed                             (separate procedure) Diagnosis Code(s):        --- Professional ---                           K25.9, Gastric ulcer, unspecified as acute or                            chronic, without hemorrhage or perforation                           K29.00, Acute gastritis without bleeding                           K92.1, Melena (includes Hematochezia) CPT copyright 2019 American Medical Association. All rights reserved. The codes documented in this report are preliminary and upon coder review may  be revised to meet current compliance requirements. Lear Ng, MD 07/24/2019 10:40:36 AM This report has been signed electronically. Number of Addenda: 0

## 2019-07-24 NOTE — Anesthesia Postprocedure Evaluation (Signed)
Anesthesia Post Note  Patient: [name]  Procedure(s) Performed: ESOPHAGOGASTRODUODENOSCOPY (EGD) WITH PROPOFOL (N/A )     Patient location during evaluation: Endoscopy Anesthesia Type: MAC Level of consciousness: awake and alert Pain management: pain level controlled Vital Signs Assessment: post-procedure vital signs reviewed and stable Respiratory status: spontaneous breathing, nonlabored ventilation and respiratory function stable Cardiovascular status: stable and blood pressure returned to baseline Postop Assessment: no apparent nausea or vomiting Anesthetic complications: no    Last Vitals:  Vitals:   07/24/19 1030 07/24/19 1040  BP: (!) 130/46 (!) 123/44  Pulse: 63 (!) 57  Resp: 19 18  Temp:    SpO2: 94% 93%    Last Pain:  Vitals:   07/24/19 1210  TempSrc:   PainSc: 6                  Yuktha Kerchner,W. EDMOND

## 2019-07-24 NOTE — Progress Notes (Signed)
Lower extremity venous has been completed.   Preliminary results in CV Proc.   Abram Sander 07/24/2019 9:24 AM

## 2019-07-24 NOTE — Anesthesia Procedure Notes (Signed)
Procedure Name: MAC Date/Time: 07/24/2019 10:00 AM Performed by: Niel Hummer, CRNA Pre-anesthesia Checklist: Patient identified, Emergency Drugs available, Suction available and Patient being monitored Patient Re-evaluated:Patient Re-evaluated prior to induction Oxygen Delivery Method: Simple face mask

## 2019-07-24 NOTE — Progress Notes (Signed)
Pt back from endoscopy in stable condition. No needs at time of return.

## 2019-07-24 NOTE — Plan of Care (Signed)
Plan of care reviewed and discussed with the patient. 

## 2019-07-24 NOTE — Interval H&P Note (Signed)
History and Physical Interval Note:  07/24/2019 10:01 AM  [name]  has presented today for surgery, with the diagnosis of Melena.  The various methods of treatment have been discussed with the patient and family. After consideration of risks, benefits and other options for treatment, the patient has consented to  Procedure(s): ESOPHAGOGASTRODUODENOSCOPY (EGD) WITH PROPOFOL (N/A) as a surgical intervention.  The patient's history has been reviewed, patient examined, no change in status, stable for surgery.  I have reviewed the patient's chart and labs.  Questions were answered to the patient's satisfaction.     Lear Ng

## 2019-07-24 NOTE — Plan of Care (Signed)
  Problem: Clinical Measurements: Goal: Cardiovascular complication will be avoided Outcome: Progressing   Problem: Activity: Goal: Risk for activity intolerance will decrease Outcome: Progressing   Problem: Clinical Measurements: Goal: Will remain free from infection Outcome: Progressing   Problem: Elimination: Goal: Will not experience complications related to bowel motility Outcome: Progressing

## 2019-07-24 NOTE — Brief Op Note (Signed)
Small nonbleeding pre-pyloric channel ulcer. No bleeding or blood products seen. See endopro for complete recs/findings. Full liquid diet. Continue IV Abx. Question small bowel inflammation as source of recent bleeding and may need a capsule endoscopy in the near future vs updated colonoscopy. [facility] to f/u tomorrow.

## 2019-07-24 NOTE — Transfer of Care (Signed)
Immediate Anesthesia Transfer of Care Note  Patient: [name]  Procedure(s) Performed: ESOPHAGOGASTRODUODENOSCOPY (EGD) WITH PROPOFOL (N/A )  Patient Location: PACU  Anesthesia Type:MAC  Level of Consciousness: awake, alert  and oriented  Airway & Oxygen Therapy: Patient Spontanous Breathing and Patient connected to nasal cannula oxygen  Post-op Assessment: Report given to RN, Post -op Vital signs reviewed and stable and Patient moving all extremities X 4  Post vital signs: Reviewed and stable  Last Vitals:  Vitals Value Taken Time  BP    Temp    Pulse    Resp    SpO2      Last Pain:  Vitals:   07/24/19 0932  TempSrc:   PainSc: 6       Patients Stated Pain Goal: 2 (29/93/71 6967)  Complications: No apparent anesthesia complications

## 2019-07-24 NOTE — Progress Notes (Signed)
PROGRESS NOTE  [name] [id number] DOB: November 07, 1945 DOA: 07/22/2019 PCP: Alroy Dust, L.Marlou Sa, MD  HPI/Recap of past 24 hours: [name] a 73 y.o.femalewith medical history significant ofIBS with alternating constipation and diarrhea, concussion with residual memory decline, malignant melanoma, hypertension, depression who presented with concerns offatigue, and increasing abdominal pain. Patient started to have midline abdominal pain yesterday and had diarrhea with dark tarry stool. Patient has had a history of what appears to be Nissen fundoplication and has a hard time vomiting but was retching and also noticed blood and small clots in her vomit. She felt warm but could not check her temperature. She denies any past history of GI bleed. Reports having a colonoscopy several years ago with no significant finding other than polyps. Patient denies any chest pain, shortness of breath.  ED Course:Patient reportedly was febrile up to 102 ( not documented) and normotensive on room air. CBC showed leukocytosis of 18.9 with stable hemoglobin of 12.4. CMP showed elevated BUN of 34 and stable creatinine of 0.84. Fecal occult blood positive. Patient was started on Tigecycline given allergies to penicillin and flagyl.  07/23/19: Patient was seen and examined at her bedside this morning.  Abdominal pain is persistent 7 out of 10 but improved from presentation with pain management.  Abdomen x-ray ordered this morning and pending.  Nausea is persistent.  IV Phenergan PRN added for intractable nausea and vomiting.  Leukocytosis is persistent at 17 K, blood cultures x2 in process.  Per CT abdomen and pelvis with contrast on 07/22/2019, suspected infectious/inflammatory enteritis or potential malignancy.  Continue tigecycline empirically.  Allergies to penicillin and Flagyl.  Mild anion gap metabolic acidosis with chemistry bicarb 21 and anion gap of 12.  On IV fluid hydration normal  saline at 75 cc/h.  Will repeat BMP in the morning.  07/24/19: Patient was seen and examined at her bedside this morning prior to EGD.  States she feels cold.    POD #0 post EGD by Dr. Michail Sermon with following findings: - Non-bleeding gastric ulcer with no stigmata of bleeding. - Acute gastritis. - Normal examined duodenum. - No specimens collected.   Assessment/Plan: Active Problems:   Melena  Melena/hematemesis post EGD showing pre-pyloric nonbleeding gastric ulcer and acute gastritis on 07/24/2019 by Dr. Michail Sermon Question small bowel inflammation as source of recent bleeding, may need a capsule endoscopy in the near future versus updated colonoscopy per GI.  Highly appreciate GI assistance. Currently on IV Protonix 40 mg nightly, will switch to oral since as resumed a diet, full liquid diet as recommended by GI CT abdomen and pelvis done on admission showed focal mucosal thickening and distention of short segment of distal ileum in the right pelvis with adjacent mesenteric stranding and 2 tiny gas bubbles."  Abdominal x-ray done on 07/23/2019 showed no sign of perforation or obstruction Continue IV antibiotics  Prepyloric nonbleeding gastric ulcer and acute gastritis Management per GI, as stated above  Possible infectious colitis Managed as stated above Presented with leukocytosis which is trending down Repeat CBC with differentials in the morning  Acute blood loss anemia likely secondary to GI bleed Management as above Hemoglobin dropped this morning from 11.0 on 07/23/2019 to 10.5 on 07/24/2019 Obtain CBC in the morning  Essential hypertension Blood pressure is currently at goal On HCTZ and metoprolol  Chronic anxiety Stable Continue Xanax 0.5 mg twice daily as needed She takes 1 mg twice daily as needed at home  Hypothyroidism Continue levothyroxine  Hyperlipidemia Continue Lipitor  Obesity Recommend weight loss outpatient with regular physical activity and  healthy dieting  DVT prophylaxis:SCD Code Status:Full Family Communication: None at bedside.  disposition Plan: Patient is currently not appropriate for discharge at this time due to recent EGD, currently on full liquid diet. She will need to tolerate a soft diet prior to discharge.  Also due to acute blood loss anemia needing monitoring of H&H.  Consults called: GI, ID    Objective: Vitals:   07/24/19 0932 07/24/19 1018 07/24/19 1030 07/24/19 1040  BP: (!) 148/52 (!) 102/33 (!) 130/46 (!) 123/44  Pulse: (!) 54 (!) 56 63 (!) 57  Resp: 16 (!) 21 19 18   Temp:  99.2 F (37.3 C)    TempSrc:  Oral    SpO2: 95% 100% 94% 93%  Weight:      Height:        Intake/Output Summary (Last 24 hours) at 07/24/2019 1153 Last data filed at 07/24/2019 1018 Gross per 24 hour  Intake 1993.84 ml  Output 0 ml  Net 1993.84 ml   Filed Weights   07/22/19 1844  Weight: 88.5 kg    Exam:   General: 73 y.o. year-old female well developed well nourished in no acute distress.  Alert and oriented x3.  Cardiovascular: Regular rate and rhythm with no rubs or gallops.  No thyromegaly or JVD noted.    Respiratory: Clear to auscultation with no wheezes or rales. Good inspiratory effort.  Abdomen: Soft nondistended with hypoactive bowel sounds present.   Musculoskeletal: No lower extremity edema. 2/4 pulses in all 4 extremities.  Psychiatry: Mood is appropriate for condition and setting   Data Reviewed: CBC: Recent Labs  Lab 07/23/19 0328 07/23/19 0643 07/23/19 1236 07/23/19 1811 07/24/19 0720  WBC 17.0* 14.3* 13.8* 14.7* 11.7*  HGB 11.9* 11.3* 10.6* 11.0* 10.5*  HCT 37.1 34.6* 33.0* 33.9* 33.3*  MCV 97.1 95.6 95.9 95.8 97.1  PLT 237 236 225 234 [id number]   Basic Metabolic Panel: Recent Labs  Lab 07/22/19 1945 07/23/19 0328  NA 139 139  K 3.6 3.6  CL 106 106  CO2 23 21*  GLUCOSE 108* 106*  BUN 34* 31*  CREATININE 0.84 0.93  CALCIUM 8.7* 8.2*   GFR: Estimated Creatinine  Clearance: 59.2 mL/min (by C-G formula based on SCr of 0.93 mg/dL). Liver Function Tests: Recent Labs  Lab 07/22/19 1945  AST 16  ALT 28  ALKPHOS 69  BILITOT 0.4  PROT 6.2*  ALBUMIN 3.4*   Recent Labs  Lab 07/23/19 1236  LIPASE 20   No results for input(s): AMMONIA in the last 168 hours. Coagulation Profile: No results for input(s): INR, PROTIME in the last 168 hours. Cardiac Enzymes: No results for input(s): CKTOTAL, CKMB, CKMBINDEX, TROPONINI in the last 168 hours. BNP (last 3 results) No results for input(s): PROBNP in the last 8760 hours. HbA1C: No results for input(s): HGBA1C in the last 72 hours. CBG: No results for input(s): GLUCAP in the last 168 hours. Lipid Profile: No results for input(s): CHOL, HDL, LDLCALC, TRIG, CHOLHDL, LDLDIRECT in the last 72 hours. Thyroid Function Tests: No results for input(s): TSH, T4TOTAL, FREET4, T3FREE, THYROIDAB in the last 72 hours. Anemia Panel: No results for input(s): VITAMINB12, FOLATE, FERRITIN, TIBC, IRON, RETICCTPCT in the last 72 hours. Urine analysis:    Component Value Date/Time   COLORURINE YELLOW 05/28/2014 2150   APPEARANCEUR CLEAR 05/28/2014 2150   LABSPEC 1.019 05/28/2014 2150   PHURINE 6.5 05/28/2014 2150   GLUCOSEU NEGATIVE 05/28/2014 2150  HGBUR NEGATIVE 05/28/2014 2150   BILIRUBINUR NEGATIVE 05/28/2014 2150   KETONESUR NEGATIVE 05/28/2014 2150   PROTEINUR NEGATIVE 05/28/2014 2150   UROBILINOGEN 0.2 05/28/2014 2150   NITRITE NEGATIVE 05/28/2014 2150   LEUKOCYTESUR NEGATIVE 05/28/2014 2150   Sepsis Labs: @LABRCNTIP (procalcitonin:4,lacticidven:4)  ) Recent Results (from the past 240 hour(s))  SARS CORONAVIRUS 2 (TAT 6-24 HRS) Nasopharyngeal Nasopharyngeal Swab     Status: None   Collection Time: 07/22/19  8:48 PM   Specimen: Nasopharyngeal Swab  Result Value Ref Range Status   SARS Coronavirus 2 NEGATIVE NEGATIVE Final    Comment: (NOTE) SARS-CoV-2 target nucleic acids are NOT DETECTED. The  SARS-CoV-2 RNA is generally detectable in upper and lower respiratory specimens during the acute phase of infection. Negative results do not preclude SARS-CoV-2 infection, do not rule out co-infections with other pathogens, and should not be used as the sole basis for treatment or other patient management decisions. Negative results must be combined with clinical observations, patient history, and epidemiological information. The expected result is Negative. Fact Sheet for Patients: SugarRoll.be Fact Sheet for Healthcare Providers: https://www.woods-mathews.com/ This test is not yet approved or cleared by the [place] FDA and  has been authorized for detection and/or diagnosis of SARS-CoV-2 by FDA under an Emergency Use Authorization (EUA). This EUA will remain  in effect (meaning this test can be used) for the duration of the COVID-19 declaration under Section 56 4(b)(1) of the Act, 21 U.S.C. section 360bbb-3(b)(1), unless the authorization is terminated or revoked sooner. Performed at [facility], [place] [address] [place], [place] [address]   Culture, blood (routine x 2)     Status: None (Preliminary result)   Collection Time: 07/22/19 11:04 PM   Specimen: BLOOD  Result Value Ref Range Status   Specimen Description   Final    BLOOD LEFT ANTECUBITAL Performed at [facility], [place] [address] [place], [place] [address]    Special Requests   Final    BOTTLES DRAWN AEROBIC AND ANAEROBIC Blood Culture results may not be optimal due to an inadequate volume of blood received in culture bottles Performed at [facility] [place] [address], [place], [place] [address]    Culture PENDING  Incomplete   Report Status PENDING  Incomplete      Studies: Vas [place] Lower Extremity Venous (dvt)  Result Date: 07/24/2019  Lower Venous Study Indications: Pain.  Comparison Study: no prior Performing Technologist: Abram Sander RVS  Examination Guidelines: A complete evaluation includes B-mode imaging, spectral Doppler, color Doppler, and power Doppler as needed of all accessible portions of each vessel. Bilateral testing is considered an integral part of a complete examination. Limited examinations for reoccurring indications may be performed as noted.  +---------+---------------+---------+-----------+----------+--------------+  RIGHT     Compressibility Phasicity Spontaneity Properties Thrombus Aging  +---------+---------------+---------+-----------+----------+--------------+  CFV       Full            Yes       Yes                                    +---------+---------------+---------+-----------+----------+--------------+  SFJ       Full                                                             +---------+---------------+---------+-----------+----------+--------------+  FV Prox   Full                                                             +---------+---------------+---------+-----------+----------+--------------+  FV Mid    Full                                                             +---------+---------------+---------+-----------+----------+--------------+  FV Distal Full                                                             +---------+---------------+---------+-----------+----------+--------------+  PFV       Full                                                             +---------+---------------+---------+-----------+----------+--------------+  POP       Full            Yes       Yes                                    +---------+---------------+---------+-----------+----------+--------------+  PTV       Full                                                             +---------+---------------+---------+-----------+----------+--------------+  PERO                                                       Not visualized  +---------+---------------+---------+-----------+----------+--------------+    +---------+---------------+---------+-----------+----------+--------------+  LEFT      Compressibility Phasicity Spontaneity Properties Thrombus Aging  +---------+---------------+---------+-----------+----------+--------------+  CFV       Full            Yes       Yes                                    +---------+---------------+---------+-----------+----------+--------------+  SFJ       Full                                                             +---------+---------------+---------+-----------+----------+--------------+  FV Prox   Full                                                             +---------+---------------+---------+-----------+----------+--------------+  FV Mid    Full                                                             +---------+---------------+---------+-----------+----------+--------------+  FV Distal Full                                                             +---------+---------------+---------+-----------+----------+--------------+  PFV       Full                                                             +---------+---------------+---------+-----------+----------+--------------+  POP       Full            Yes       Yes                                    +---------+---------------+---------+-----------+----------+--------------+  PTV       Full                                                             +---------+---------------+---------+-----------+----------+--------------+  PERO      Full                                                             +---------+---------------+---------+-----------+----------+--------------+     Summary: Right: There is no evidence of deep vein thrombosis in the lower extremity. No cystic structure found in the popliteal fossa. Left: There is no evidence of deep vein thrombosis in the lower extremity. No cystic structure found in the popliteal fossa.  *See table(s) above for measurements and observations.    Preliminary     Scheduled  Meds:  atorvastatin  40 mg Oral q1800   enoxaparin (LOVENOX) injection  40 mg Subcutaneous Q24H   hydrochlorothiazide  25 mg Oral Daily   levothyroxine  50 mcg Oral Q0600   metoprolol tartrate  50 mg Oral BID   montelukast  10 mg Oral Daily   ondansetron (ZOFRAN) IV  4 mg Intravenous Q8H  pantoprazole (PROTONIX) IV  40 mg Intravenous QHS    Continuous Infusions:  sodium chloride     meropenem (MERREM) IV 1 g (07/24/19 0627)     LOS: 1 day     Kayleen Memos, MD [facility] Pager [phone]  If 7PM-7AM, please contact night-coverage [email or web address] Password [email or web address] 07/24/2019, 11:53 AM

## 2019-07-24 NOTE — Anesthesia Preprocedure Evaluation (Addendum)
Anesthesia Evaluation  Patient identified by MRN, date of birth, ID band Patient awake    Reviewed: Allergy & Precautions, H&P , NPO status , Patient's Chart, lab work & pertinent test results, reviewed documented beta blocker date and time   Airway Mallampati: II  TM Distance: >3 FB Neck ROM: Full    Dental no notable dental hx. (+) Teeth Intact, Dental Advisory Given   Pulmonary neg pulmonary ROS,    Pulmonary exam normal breath sounds clear to auscultation       Cardiovascular hypertension, Pt. on medications and Pt. on home beta blockers  Rhythm:Regular Rate:Normal     Neuro/Psych Depression negative neurological ROS     GI/Hepatic negative GI ROS, Neg liver ROS,   Endo/Other  negative endocrine ROS  Renal/GU negative Renal ROS  negative genitourinary   Musculoskeletal   Abdominal   Peds  Hematology negative hematology ROS (+)   Anesthesia Other Findings   Reproductive/Obstetrics negative OB ROS                            Anesthesia Physical Anesthesia Plan  ASA: II  Anesthesia Plan: MAC   Post-op Pain Management:    Induction: Intravenous  PONV Risk Score and Plan: 2 and Propofol infusion and Ondansetron  Airway Management Planned: Nasal Cannula  Additional Equipment:   Intra-op Plan:   Post-operative Plan:   Informed Consent: I have reviewed the patients History and Physical, chart, labs and discussed the procedure including the risks, benefits and alternatives for the proposed anesthesia with the patient or authorized representative who has indicated his/her understanding and acceptance.     Dental advisory given  Plan Discussed with: CRNA  Anesthesia Plan Comments:         Anesthesia Quick Evaluation

## 2019-07-25 ENCOUNTER — Inpatient Hospital Stay (HOSPITAL_COMMUNITY): Payer: PPO

## 2019-07-25 ENCOUNTER — Encounter (HOSPITAL_COMMUNITY): Payer: Self-pay | Admitting: Gastroenterology

## 2019-07-25 LAB — CBC WITH DIFFERENTIAL/PLATELET
Abs Immature Granulocytes: 0.1 10*3/uL — ABNORMAL HIGH (ref 0.00–0.07)
Basophils Absolute: 0.1 10*3/uL (ref 0.0–0.1)
Basophils Relative: 1 %
Eosinophils Absolute: 0.6 10*3/uL — ABNORMAL HIGH (ref 0.0–0.5)
Eosinophils Relative: 5 %
HCT: 35.2 % — ABNORMAL LOW (ref 36.0–46.0)
Hemoglobin: 11.5 g/dL — ABNORMAL LOW (ref 12.0–15.0)
Immature Granulocytes: 1 %
Lymphocytes Relative: 31 %
Lymphs Abs: 3.4 10*3/uL (ref 0.7–4.0)
MCH: 30.8 pg (ref 26.0–34.0)
MCHC: 32.7 g/dL (ref 30.0–36.0)
MCV: 94.4 fL (ref 80.0–100.0)
Monocytes Absolute: 1 10*3/uL (ref 0.1–1.0)
Monocytes Relative: 9 %
Neutro Abs: 5.7 10*3/uL (ref 1.7–7.7)
Neutrophils Relative %: 53 %
Platelets: 169 10*3/uL (ref 150–400)
RBC: 3.73 MIL/uL — ABNORMAL LOW (ref 3.87–5.11)
RDW: 13.2 % (ref 11.5–15.5)
WBC: 10.8 10*3/uL — ABNORMAL HIGH (ref 4.0–10.5)
nRBC: 0 % (ref 0.0–0.2)

## 2019-07-25 LAB — MAGNESIUM: Magnesium: 2 mg/dL (ref 1.7–2.4)

## 2019-07-25 LAB — PHOSPHORUS: Phosphorus: 2.9 mg/dL (ref 2.5–4.6)

## 2019-07-25 LAB — SEDIMENTATION RATE: Sed Rate: 20 mm/hr (ref 0–22)

## 2019-07-25 MED ORDER — BARIUM SULFATE 0.1 % PO SUSP: 450.0000 mL | Freq: Three times a day (TID) | ORAL | Status: DC | PRN

## 2019-07-25 MED ORDER — SODIUM CHLORIDE (PF) 0.9 % IJ SOLN
INTRAMUSCULAR | Status: AC
  Filled 2019-07-25: qty 50

## 2019-07-25 MED ORDER — IOHEXOL 300 MG/ML  SOLN
100.0000 mL | Freq: Once | INTRAMUSCULAR | Status: AC | PRN
  Administered 2019-07-25: 100 mL via INTRAVENOUS

## 2019-07-25 MED ORDER — BARIUM SULFATE 0.1 % PO SUSP
ORAL | Status: AC
  Filled 2019-07-25: qty 3

## 2019-07-25 MED ORDER — POLYETHYLENE GLYCOL 3350 17 G PO PACK
17.0000 g | PACK | Freq: Every day | ORAL | Status: DC
  Administered 2019-07-25: 17 g via ORAL
  Filled 2019-07-25: qty 1

## 2019-07-25 MED ORDER — SENNOSIDES-DOCUSATE SODIUM 8.6-50 MG PO TABS
2.0000 | ORAL_TABLET | Freq: Two times a day (BID) | ORAL | Status: DC
  Administered 2019-07-25: 2 via ORAL
  Filled 2019-07-25 (×2): qty 2

## 2019-07-25 NOTE — Progress Notes (Signed)
PROGRESS NOTE  [name] [id number] DOB: [birth year] DOA: 07/22/2019 PCP: Alroy Dust, L.Marlou Sa, MD  HPI/Recap of past 24 hours: [name] a 73 y.o.femalewith medical history significant ofIBS with alternating constipation and diarrhea, concussion with residual memory decline, malignant melanoma, hypertension, depression who presented with concerns offatigue, and increasing abdominal pain. Patient started to have midline abdominal pain yesterday and had diarrhea with dark tarry stool. Patient has had a history of what appears to be Nissen fundoplication and has a hard time vomiting but was retching and also noticed blood and small clots in her vomit. She felt warm but could not check her temperature. She denies any past history of GI bleed. Reports having a colonoscopy several years ago with no significant finding other than polyps. Patient denies any chest pain, shortness of breath.  ED Course:Patient reportedly was febrile up to 102 ( not documented) and normotensive on room air. CBC showed leukocytosis of 18.9 with stable hemoglobin of 12.4. CMP showed elevated BUN of 34 and stable creatinine of 0.84. Fecal occult blood positive. Patient was started on Tigecycline given allergies to penicillin and flagyl.  EGD completed on 07/24/2019 by Dr. Michail Sermon  with following findings: - Non-bleeding gastric ulcer with no stigmata of bleeding. - Acute gastritis. - Normal examined duodenum. - No specimens collected.   07/25/19: Patient was seen and examined at her bedside this morning.  Reports mild epigastric pain.  States she had retching yesterday afternoon however appears to tolerate a full liquid diet at this time.  Assessment/Plan: Active Problems:   Melena  Melena/hematemesis post EGD showing pre-pyloric nonbleeding gastric ulcer and acute gastritis on 07/24/2019 by Dr. Michail Sermon Question small bowel inflammation as source of recent bleeding Seen by GI, CT  enterography planned today  Currently on p.o. Protonix daily  Management per GI CT abdomen and pelvis done on admission showed focal mucosal thickening and distention of short segment of distal ileum in the right pelvis with adjacent mesenteric stranding and 2 tiny gas bubbles."  Abdominal x-ray done on 07/23/2019 showed no obvious sign of perforation or obstruction Continue IV antibiotics, on meropenem due to allergy to penicillin and Flagyl  Prepyloric nonbleeding gastric ulcer and acute gastritis Management per GI  Possible infectious colitis Managed as stated above Presented with leukocytosis which is resolving WBC 10.8 on 07/25/2019 from 14.7K Blood cultures drawn on 07/22/2019- x2 days  Improving acute blood loss anemia likely secondary to GI bleed Management as above Hemoglobin dropped  from 11.0 on 07/23/2019 to 10.5 on 07/24/2019 Hemoglobin 11.5 on 07/25/2019. Continue to monitor H&H  Constipation Start Senokot 2 tablet twice daily and MiraLAX daily  Essential hypertension Blood pressure is currently at goal On HCTZ and metoprolol  Chronic anxiety Stable Continue Xanax 0.5 mg twice daily as needed She takes 1 mg twice daily as needed at home  Hypothyroidism Continue levothyroxine  Hyperlipidemia Continue Lipitor  Obesity Recommend weight loss outpatient with regular physical activity and healthy dieting  DVT prophylaxis:SCD Code Status:Full Family Communication: None at bedside.  disposition Plan: Patient is currently not appropriate for discharge at this time due to recent EGD, currently on full liquid diet. She will need to tolerate a soft diet prior to discharge.  Also due to acute blood loss anemia needing monitoring of H&H.  Consults called: GI, ID    Objective: Vitals:   07/24/19 1610 07/24/19 2150 07/24/19 2208 07/25/19 0514  BP: 135/69  (!) 127/53 (!) 117/59  Pulse: (!) 50 (!) 54 (!) 55 Marland Kitchen)  52  Resp: 16 16 18 16   Temp: 98.9 F (37.2 C)  98.5  F (36.9 C) 98.7 F (37.1 C)  TempSrc:      SpO2: 99%  97% 100%  Weight:      Height:        Intake/Output Summary (Last 24 hours) at 07/25/2019 1435 Last data filed at 07/25/2019 1000 Gross per 24 hour  Intake 1895.14 ml  Output -  Net 1895.14 ml   Filed Weights   07/22/19 1844  Weight: 88.5 kg    Exam:  . General: 73 y.o. year-old female well-developed well-nourished in no acute distress.  Alert and oriented x4.   . Cardiovascular: Regular rate and rhythm no rubs or gallops no JVD or thyromegaly noted. Marland Kitchen Respiratory: Auscultation no wheezing or rales.  Poor inspiratory effort.   . Abdomen: Mild epigastric pain with moderate palpation is noted.  . Musculoskeletal: Trace lower extremity edema.  2/4 pulses in all 4 extremities.   Marland Kitchen Psychiatry: Mood is appropriate for condition and setting.   Data Reviewed: CBC: Recent Labs  Lab 07/23/19 0643 07/23/19 1236 07/23/19 1811 07/24/19 0720 07/25/19 0326  WBC 14.3* 13.8* 14.7* 11.7* 10.8*  NEUTROABS  --   --   --   --  5.7  HGB 11.3* 10.6* 11.0* 10.5* 11.5*  HCT 34.6* 33.0* 33.9* 33.3* 35.2*  MCV 95.6 95.9 95.8 97.1 94.4  PLT 236 225 234 234 [id number]   Basic Metabolic Panel: Recent Labs  Lab 07/22/19 1945 07/23/19 0328 07/24/19 0720 07/25/19 0326  NA 139 139 138  --   K 3.6 3.6 3.6  --   CL 106 106 104  --   CO2 23 21* 25  --   GLUCOSE 108* 106* 88  --   BUN 34* 31* 21  --   CREATININE 0.84 0.93 0.86  --   CALCIUM 8.7* 8.2* 7.8*  --   MG  --   --   --  2.0  PHOS  --   --   --  2.9   GFR: Estimated Creatinine Clearance: 64 mL/min (by C-G formula based on SCr of 0.86 mg/dL). Liver Function Tests: Recent Labs  Lab 07/22/19 1945  AST 16  ALT 28  ALKPHOS 69  BILITOT 0.4  PROT 6.2*  ALBUMIN 3.4*   Recent Labs  Lab 07/23/19 1236  LIPASE 20   No results for input(s): AMMONIA in the last 168 hours. Coagulation Profile: No results for input(s): INR, PROTIME in the last 168 hours. Cardiac Enzymes: No  results for input(s): CKTOTAL, CKMB, CKMBINDEX, TROPONINI in the last 168 hours. BNP (last 3 results) No results for input(s): PROBNP in the last 8760 hours. HbA1C: No results for input(s): HGBA1C in the last 72 hours. CBG: No results for input(s): GLUCAP in the last 168 hours. Lipid Profile: No results for input(s): CHOL, HDL, LDLCALC, TRIG, CHOLHDL, LDLDIRECT in the last 72 hours. Thyroid Function Tests: No results for input(s): TSH, T4TOTAL, FREET4, T3FREE, THYROIDAB in the last 72 hours. Anemia Panel: No results for input(s): VITAMINB12, FOLATE, FERRITIN, TIBC, IRON, RETICCTPCT in the last 72 hours. Urine analysis:    Component Value Date/Time   COLORURINE YELLOW 05/28/2014 2150   APPEARANCEUR CLEAR 05/28/2014 2150   LABSPEC 1.019 05/28/2014 2150   PHURINE 6.5 05/28/2014 2150   GLUCOSEU NEGATIVE 05/28/2014 2150   HGBUR NEGATIVE 05/28/2014 2150   BILIRUBINUR NEGATIVE 05/28/2014 2150   [place] NEGATIVE 05/28/2014 2150   PROTEINUR NEGATIVE 05/28/2014 2150   UROBILINOGEN  0.2 05/28/2014 2150   NITRITE NEGATIVE 05/28/2014 2150   LEUKOCYTESUR NEGATIVE 05/28/2014 2150   Sepsis Labs: @LABRCNTIP (procalcitonin:4,lacticidven:4)  ) Recent Results (from the past 240 hour(s))  SARS CORONAVIRUS 2 (TAT 6-24 HRS) Nasopharyngeal Nasopharyngeal Swab     Status: None   Collection Time: 07/22/19  8:48 PM   Specimen: Nasopharyngeal Swab  Result Value Ref Range Status   SARS Coronavirus 2 NEGATIVE NEGATIVE Final    Comment: (NOTE) SARS-CoV-2 target nucleic acids are NOT DETECTED. The SARS-CoV-2 RNA is generally detectable in upper and lower respiratory specimens during the acute phase of infection. Negative results do not preclude SARS-CoV-2 infection, do not rule out co-infections with other pathogens, and should not be used as the sole basis for treatment or other patient management decisions. Negative results must be combined with clinical observations, patient history, and  epidemiological information. The expected result is Negative. Fact Sheet for Patients: SugarRoll.be Fact Sheet for Healthcare Providers: https://www.woods-mathews.com/ This test is not yet approved or cleared by the [place] FDA and  has been authorized for detection and/or diagnosis of SARS-CoV-2 by FDA under an Emergency Use Authorization (EUA). This EUA will remain  in effect (meaning this test can be used) for the duration of the COVID-19 declaration under Section 56 4(b)(1) of the Act, 21 U.S.C. section 360bbb-3(b)(1), unless the authorization is terminated or revoked sooner. Performed at [facility], [place] [address] [place], [place] [address]   Culture, blood (routine x 2)     Status: None (Preliminary result)   Collection Time: 07/22/19 11:04 PM   Specimen: BLOOD  Result Value Ref Range Status   Specimen Description   Final    BLOOD LEFT ANTECUBITAL Performed at [facility], [place] [address], [place], [place] [address]    Special Requests   Final    BOTTLES DRAWN AEROBIC AND ANAEROBIC Blood Culture results may not be optimal due to an inadequate volume of blood received in culture bottles Performed at [place] [address] [place], [place] [address]    Culture   Final    NO GROWTH 2 DAYS Performed at [place] [address] [place], [place] [address]    Report Status PENDING  Incomplete  Culture, blood (routine x 2)     Status: None (Preliminary result)   Collection Time: 07/22/19 11:09 PM   Specimen: BLOOD LEFT HAND  Result Value Ref Range Status   Specimen Description   Final    BLOOD LEFT HAND Performed at [place] [address] [place], [place] [address]    Special Requests   Final    BOTTLES DRAWN AEROBIC AND ANAEROBIC Blood Culture adequate volume Performed at [facility] [address] [place], [place] [address]     Culture   Final    NO GROWTH 2 DAYS Performed at [place] [address] [place],  [address]    Report Status PENDING  Incomplete      Studies: No results found.  Scheduled Meds: . atorvastatin  40 mg Oral q1800  . barium      . enoxaparin (LOVENOX) injection  40 mg Subcutaneous Q24H  . hydrochlorothiazide  25 mg Oral Daily  . levothyroxine  50 mcg Oral Q0600  . metoprolol tartrate  50 mg Oral BID  . montelukast  10 mg Oral Daily  . pantoprazole  40 mg Oral Daily  . senna-docusate  2 tablet Oral BID  .  sodium chloride (PF)        Continuous Infusions: . sodium chloride 50 mL/hr at 07/25/19 0600  . meropenem (MERREM) IV 1 g (07/25/19 0705)     LOS: 2 days     Kayleen Memos, MD [facility] Pager [phone]  If 7PM-7AM, please contact night-coverage [email or web address] Password [email or web address] 07/25/2019, 2:35 PM

## 2019-07-25 NOTE — Progress Notes (Signed)
Subjective: Patient states she has not had a bowel movement since Friday. She reports improvement in abdominal pain, however felt nauseous twice yesterday and states she is not able to vomit due to history of fundoplication. T-max was 100.1 F and is able to tolerate full liquid diet.  Objective: Vital signs in last 24 hours: Temp:  [98.5 F (36.9 C)-100.1 F (37.8 C)] 98.7 F (37.1 C) (10/12 0514) Pulse Rate:  [50-65] 52 (10/12 0514) Resp:  [16-21] 16 (10/12 0514) BP: (102-148)/(33-69) 117/59 (10/12 0514) SpO2:  [93 %-100 %] 100 % (10/12 0514) Weight change:  Last BM Date: 07/23/19  PE: Overweight, not in distress GENERAL: Mild pallor ABDOMEN: Soft, mild tenderness in right lower quadrant and left upper quadrant EXTREMITIES: No deformity  Lab Results: Results for orders placed or performed during the hospital encounter of 07/22/19 (from the past 48 hour(s))  CBC     Status: Abnormal   Collection Time: 07/23/19 12:36 PM  Result Value Ref Range   WBC 13.8 (H) 4.0 - 10.5 K/uL   RBC 3.44 (L) 3.87 - 5.11 MIL/uL   Hemoglobin 10.6 (L) 12.0 - 15.0 g/dL   HCT 33.0 (L) 36.0 - 46.0 %   MCV 95.9 80.0 - 100.0 fL   MCH 30.8 26.0 - 34.0 pg   MCHC 32.1 30.0 - 36.0 g/dL   RDW 13.6 11.5 - 15.5 %   Platelets 225 150 - 400 K/uL   nRBC 0.0 0.0 - 0.2 %    Comment: Performed at [facility], [place] [address], [place], [place] [address]  Lipase, blood     Status: None   Collection Time: 07/23/19 12:36 PM  Result Value Ref Range   Lipase 20 11 - 51 U/L    Comment: Performed at [facility], [place] [address], [place], [place] [address]  Brain natriuretic peptide     Status: None   Collection Time: 07/23/19 12:36 PM  Result Value Ref Range   B Natriuretic Peptide 44.1 0.0 - 100.0 pg/mL    Comment: Performed at [facility], [place] [address], [place], [place] [address]  CBC     Status: Abnormal   Collection Time: 07/23/19  6:11 PM  Result  Value Ref Range   WBC 14.7 (H) 4.0 - 10.5 K/uL   RBC 3.54 (L) 3.87 - 5.11 MIL/uL   Hemoglobin 11.0 (L) 12.0 - 15.0 g/dL   HCT 33.9 (L) 36.0 - 46.0 %   MCV 95.8 80.0 - 100.0 fL   MCH 31.1 26.0 - 34.0 pg   MCHC 32.4 30.0 - 36.0 g/dL   RDW 13.5 11.5 - 15.5 %   Platelets 234 150 - 400 K/uL   nRBC 0.0 0.0 - 0.2 %    Comment: Performed at [facility], [place] [address], [place], [place] [address]  CBC     Status: Abnormal   Collection Time: 07/24/19  7:20 AM  Result Value Ref Range   WBC 11.7 (H) 4.0 - 10.5 K/uL   RBC 3.43 (L) 3.87 - 5.11 MIL/uL   Hemoglobin 10.5 (L) 12.0 - 15.0 g/dL   HCT 33.3 (L) 36.0 - 46.0 %   MCV 97.1 80.0 - 100.0 fL   MCH 30.6 26.0 - 34.0 pg   MCHC 31.5 30.0 - 36.0 g/dL   RDW 13.6 11.5 - 15.5 %   Platelets 234 150 - 400 K/uL   nRBC 0.0 0.0 - 0.2 %    Comment: Performed at [facility]  [place] [address] [place], [place] [id number]  Basic metabolic panel     Status: Abnormal   Collection Time: 07/24/19  7:20 AM  Result Value Ref Range   Sodium 138 135 - 145 mmol/L   Potassium 3.6 3.5 - 5.1 mmol/L   Chloride 104 98 - 111 mmol/L   CO2 25 22 - 32 mmol/L   Glucose, Bld 88 70 - 99 mg/dL   BUN 21 8 - 23 mg/dL   Creatinine, Ser 0.86 0.44 - 1.00 mg/dL   Calcium 7.8 (L) 8.9 - 10.3 mg/dL   GFR calc non Af Amer >60 >60 mL/min   GFR calc Af Amer >60 >60 mL/min   Anion gap 9 5 - 15    Comment: Performed at [facility], [place] [address], [place], [place] [address]  CBC with Differential/Platelet     Status: Abnormal   Collection Time: 07/25/19  3:26 AM  Result Value Ref Range   WBC 10.8 (H) 4.0 - 10.5 K/uL   RBC 3.73 (L) 3.87 - 5.11 MIL/uL   Hemoglobin 11.5 (L) 12.0 - 15.0 g/dL   HCT 35.2 (L) 36.0 - 46.0 %   MCV 94.4 80.0 - 100.0 fL   MCH 30.8 26.0 - 34.0 pg   MCHC 32.7 30.0 - 36.0 g/dL   RDW 13.2 11.5 - 15.5 %   Platelets 169 150 - 400 K/uL   nRBC 0.0 0.0 - 0.2 %   Neutrophils Relative % 53 %   Neutro Abs 5.7  1.7 - 7.7 K/uL   Lymphocytes Relative 31 %   Lymphs Abs 3.4 0.7 - 4.0 K/uL   Monocytes Relative 9 %   Monocytes Absolute 1.0 0.1 - 1.0 K/uL   Eosinophils Relative 5 %   Eosinophils Absolute 0.6 (H) 0.0 - 0.5 K/uL   Basophils Relative 1 %   Basophils Absolute 0.1 0.0 - 0.1 K/uL   Immature Granulocytes 1 %   Abs Immature Granulocytes 0.10 (H) 0.00 - 0.07 K/uL    Comment: Performed at [facility], [place] [address], [place], [place] [address]  Magnesium     Status: None   Collection Time: 07/25/19  3:26 AM  Result Value Ref Range   Magnesium 2.0 1.7 - 2.4 mg/dL    Comment: Performed at [facility], [place] [address], [place], [place] [address]  Phosphorus     Status: None   Collection Time: 07/25/19  3:26 AM  Result Value Ref Range   Phosphorus 2.9 2.5 - 4.6 mg/dL    Comment: Performed at [facility], [place] [address], [place], [place] [address]    Studies/Results: Vas [place] Lower Extremity Venous (dvt)  Result Date: 07/24/2019  Lower Venous Study Indications: Pain.  Comparison Study: no prior Performing Technologist: Abram Sander RVS  Examination Guidelines: A complete evaluation includes B-mode imaging, spectral Doppler, color Doppler, and power Doppler as needed of all accessible portions of each vessel. Bilateral testing is considered an integral part of a complete examination. Limited examinations for reoccurring indications may be performed as noted.  +---------+---------------+---------+-----------+----------+--------------+ RIGHT    CompressibilityPhasicitySpontaneityPropertiesThrombus Aging +---------+---------------+---------+-----------+----------+--------------+ CFV      Full           Yes      Yes                                 +---------+---------------+---------+-----------+----------+--------------+ SFJ  Full                                                         +---------+---------------+---------+-----------+----------+--------------+ FV Prox  Full                                                        +---------+---------------+---------+-----------+----------+--------------+ FV Mid   Full                                                        +---------+---------------+---------+-----------+----------+--------------+ FV DistalFull                                                        +---------+---------------+---------+-----------+----------+--------------+ PFV      Full                                                        +---------+---------------+---------+-----------+----------+--------------+ POP      Full           Yes      Yes                                 +---------+---------------+---------+-----------+----------+--------------+ PTV      Full                                                        +---------+---------------+---------+-----------+----------+--------------+ PERO                                                  Not visualized +---------+---------------+---------+-----------+----------+--------------+   +---------+---------------+---------+-----------+----------+--------------+ LEFT     CompressibilityPhasicitySpontaneityPropertiesThrombus Aging +---------+---------------+---------+-----------+----------+--------------+ CFV      Full           Yes      Yes                                 +---------+---------------+---------+-----------+----------+--------------+ SFJ      Full                                                        +---------+---------------+---------+-----------+----------+--------------+  FV Prox  Full                                                        +---------+---------------+---------+-----------+----------+--------------+ FV Mid   Full                                                         +---------+---------------+---------+-----------+----------+--------------+ FV DistalFull                                                        +---------+---------------+---------+-----------+----------+--------------+ PFV      Full                                                        +---------+---------------+---------+-----------+----------+--------------+ POP      Full           Yes      Yes                                 +---------+---------------+---------+-----------+----------+--------------+ PTV      Full                                                        +---------+---------------+---------+-----------+----------+--------------+ PERO     Full                                                        +---------+---------------+---------+-----------+----------+--------------+     Summary: Right: There is no evidence of deep vein thrombosis in the lower extremity. No cystic structure found in the popliteal fossa. Left: There is no evidence of deep vein thrombosis in the lower extremity. No cystic structure found in the popliteal fossa.  *See table(s) above for measurements and observations. Electronically signed by Ruta Hinds MD on 07/24/2019 at 12:34:42 PM.    Final     Medications: I have reviewed the patient's current medications.  Assessment: Patient presented with black tarry stool suspicious for melena(hemoglobin on admission was 12.4 and is 11.5 today) EGD showed a small gastric ulcer, BUN trending down(was 34 and is 21 today)  Patient had marked leukocytosis of 18.9 on admission, came in with a fever, WBC 10.8 today  CT on admission showed mucosal thickening and distention of short segment of distal ileum with adjacent mesenteric stranding and 2 tiny gas bubbles along with cluster of small marked rounded lymph nodes in adjacent mesentery and milder circumferential mucosal thickening of upstream  ileum, which may represent infection/inflammatory  enteritis or potential malignancy and microperforation cannot be excluded  Plan: Patient is on IV Merrem and symptomatically better Patient is also on PPI daily with stable hemoglobin and downtrending BUN with no further evidence of melena Will get a CT enterography today. I am not sure doing a capsule endoscopy in a patient with ongoing inflammation and possible microperforation would be ideal at the moment, but can be considered as an outpatient after she completes her course of antibiotics. Last colonoscopy was performed in 2015 by Dr. Amedeo Plenty for chronic diarrhea, the entire colon appeared unremarkable and random biopsies were normal.   Ronnette Juniper, MD 07/25/2019, 9:05 AM

## 2019-07-26 DIAGNOSIS — E7800 Pure hypercholesterolemia, unspecified: Secondary | ICD-10-CM

## 2019-07-26 DIAGNOSIS — K5792 Diverticulitis of intestine, part unspecified, without perforation or abscess without bleeding: Secondary | ICD-10-CM

## 2019-07-26 DIAGNOSIS — K2901 Acute gastritis with bleeding: Secondary | ICD-10-CM

## 2019-07-26 DIAGNOSIS — K279 Peptic ulcer, site unspecified, unspecified as acute or chronic, without hemorrhage or perforation: Secondary | ICD-10-CM

## 2019-07-26 LAB — CBC WITH DIFFERENTIAL/PLATELET
Abs Immature Granulocytes: 0.13 10*3/uL — ABNORMAL HIGH (ref 0.00–0.07)
Basophils Absolute: 0.1 10*3/uL (ref 0.0–0.1)
Basophils Relative: 1 %
Eosinophils Absolute: 0.4 10*3/uL (ref 0.0–0.5)
Eosinophils Relative: 4 %
HCT: 31.6 % — ABNORMAL LOW (ref 36.0–46.0)
Hemoglobin: 10.1 g/dL — ABNORMAL LOW (ref 12.0–15.0)
Immature Granulocytes: 1 %
Lymphocytes Relative: 30 %
Lymphs Abs: 3.7 10*3/uL (ref 0.7–4.0)
MCH: 31 pg (ref 26.0–34.0)
MCHC: 32 g/dL (ref 30.0–36.0)
MCV: 96.9 fL (ref 80.0–100.0)
Monocytes Absolute: 1.2 10*3/uL — ABNORMAL HIGH (ref 0.1–1.0)
Monocytes Relative: 10 %
Neutro Abs: 6.8 10*3/uL (ref 1.7–7.7)
Neutrophils Relative %: 54 %
Platelets: 224 10*3/uL (ref 150–400)
RBC: 3.26 MIL/uL — ABNORMAL LOW (ref 3.87–5.11)
RDW: 13.4 % (ref 11.5–15.5)
WBC: 12.3 10*3/uL — ABNORMAL HIGH (ref 4.0–10.5)
nRBC: 0 % (ref 0.0–0.2)

## 2019-07-26 LAB — COMPREHENSIVE METABOLIC PANEL
ALT: 18 U/L (ref 0–44)
AST: 16 U/L (ref 15–41)
Albumin: 2.9 g/dL — ABNORMAL LOW (ref 3.5–5.0)
Alkaline Phosphatase: 66 U/L (ref 38–126)
Anion gap: 7 (ref 5–15)
BUN: 5 mg/dL — ABNORMAL LOW (ref 8–23)
CO2: 29 mmol/L (ref 22–32)
Calcium: 7.9 mg/dL — ABNORMAL LOW (ref 8.9–10.3)
Chloride: 103 mmol/L (ref 98–111)
Creatinine, Ser: 0.73 mg/dL (ref 0.44–1.00)
GFR calc Af Amer: >60 mL/min (ref >60)
GFR calc non Af Amer: >60 mL/min (ref >60)
Glucose, Bld: 113 mg/dL — ABNORMAL HIGH (ref 70–99)
Potassium: 3.4 mmol/L — ABNORMAL LOW (ref 3.5–5.1)
Sodium: 139 mmol/L (ref 135–145)
Total Bilirubin: 0.6 mg/dL (ref 0.3–1.2)
Total Protein: 5.4 g/dL — ABNORMAL LOW (ref 6.5–8.1)

## 2019-07-26 LAB — HIGH SENSITIVITY CRP: CRP, High Sensitivity: 30.72 mg/L — ABNORMAL HIGH (ref 0.00–3.00)

## 2019-07-26 MED ORDER — PSYLLIUM 95 % PO PACK: 1.0000 | PACK | Freq: Three times a day (TID) | ORAL | Status: DC

## 2019-07-26 MED ORDER — POTASSIUM CHLORIDE 20 MEQ PO PACK
40.0000 meq | PACK | Freq: Once | ORAL | Status: AC
  Administered 2019-07-26: 40 meq via ORAL
  Filled 2019-07-26: qty 2

## 2019-07-26 MED ORDER — PSYLLIUM 95 % PO PACK
1.0000 | PACK | Freq: Two times a day (BID) | ORAL | Status: DC
  Administered 2019-07-26 – 2019-07-27 (×3): 1 via ORAL
  Filled 2019-07-26 (×4): qty 1

## 2019-07-26 NOTE — Progress Notes (Signed)
Subjective: Patient reports having a large bowel movement yesterday evening which she describes as a blowout, and another small amount of bowel movement today, both described as dark green, initially loose and liquid, later slightly formed.  Her abdominal pain has improved and she is requesting for regular diet.  Objective: Vital signs in last 24 hours: Temp:  [98.2 F (36.8 C)-99.8 F (37.7 C)] 98.2 F (36.8 C) (10/13 0510) Pulse Rate:  [51-77] 51 (10/13 0510) Resp:  [15-18] 16 (10/13 0510) BP: (114-154)/(63-69) 154/69 (10/13 0510) SpO2:  [97 %-100 %] 98 % (10/13 0510) Weight change:  Last BM Date: 07/25/19  PE: Not in distress GENERAL: Mild pallor ABDOMEN: Soft, non distended, mild right lower quadrant tenderness  EXTREMITIES: No deformity  Lab Results: Results for orders placed or performed during the hospital encounter of 07/22/19 (from the past 48 hour(s))  CBC with Differential/Platelet     Status: Abnormal   Collection Time: 07/25/19  3:26 AM  Result Value Ref Range   WBC 10.8 (H) 4.0 - 10.5 K/uL   RBC 3.73 (L) 3.87 - 5.11 MIL/uL   Hemoglobin 11.5 (L) 12.0 - 15.0 g/dL   HCT 35.2 (L) 36.0 - 46.0 %   MCV 94.4 80.0 - 100.0 fL   MCH 30.8 26.0 - 34.0 pg   MCHC 32.7 30.0 - 36.0 g/dL   RDW 13.2 11.5 - 15.5 %   Platelets 169 150 - 400 K/uL   nRBC 0.0 0.0 - 0.2 %   Neutrophils Relative % 53 %   Neutro Abs 5.7 1.7 - 7.7 K/uL   Lymphocytes Relative 31 %   Lymphs Abs 3.4 0.7 - 4.0 K/uL   Monocytes Relative 9 %   Monocytes Absolute 1.0 0.1 - 1.0 K/uL   Eosinophils Relative 5 %   Eosinophils Absolute 0.6 (H) 0.0 - 0.5 K/uL   Basophils Relative 1 %   Basophils Absolute 0.1 0.0 - 0.1 K/uL   Immature Granulocytes 1 %   Abs Immature Granulocytes 0.10 (H) 0.00 - 0.07 K/uL    Comment: Performed at [facility], [place] [address], [place], [place] [address]  Magnesium     Status: None   Collection Time: 07/25/19  3:26 AM  Result Value Ref Range   Magnesium 2.0  1.7 - 2.4 mg/dL    Comment: Performed at [facility], [place] [address], [place], [place] [address]  Phosphorus     Status: None   Collection Time: 07/25/19  3:26 AM  Result Value Ref Range   Phosphorus 2.9 2.5 - 4.6 mg/dL    Comment: Performed at [facility], [place] [address] [place], [place] [address]  Sedimentation rate     Status: None   Collection Time: 07/25/19  9:55 AM  Result Value Ref Range   Sed Rate 20 0 - 22 mm/hr    Comment: Performed at [facility], [place] [address], [place], [place] [address]  High sensitivity CRP     Status: Abnormal   Collection Time: 07/25/19  9:55 AM  Result Value Ref Range   CRP, High Sensitivity 30.72 (H) 0.00 - 3.00 mg/L    Comment: (NOTE) Results confirmed on dilution.         Relative Risk for Future Cardiovascular Event                             Low                 <  1.00                             Average       1.00 - 3.00                             High                >3.00 Performed At: [facility] [place], [place] [id number] Rush Farmer MD [id number]   CBC with Differential/Platelet     Status: Abnormal   Collection Time: 07/26/19  3:22 AM  Result Value Ref Range   WBC 12.3 (H) 4.0 - 10.5 K/uL   RBC 3.26 (L) 3.87 - 5.11 MIL/uL   Hemoglobin 10.1 (L) 12.0 - 15.0 g/dL   HCT 31.6 (L) 36.0 - 46.0 %   MCV 96.9 80.0 - 100.0 fL   MCH 31.0 26.0 - 34.0 pg   MCHC 32.0 30.0 - 36.0 g/dL   RDW 13.4 11.5 - 15.5 %   Platelets 224 150 - 400 K/uL   nRBC 0.0 0.0 - 0.2 %   Neutrophils Relative % 54 %   Neutro Abs 6.8 1.7 - 7.7 K/uL   Lymphocytes Relative 30 %   Lymphs Abs 3.7 0.7 - 4.0 K/uL   Monocytes Relative 10 %   Monocytes Absolute 1.2 (H) 0.1 - 1.0 K/uL   Eosinophils Relative 4 %   Eosinophils Absolute 0.4 0.0 - 0.5 K/uL   Basophils Relative 1 %   Basophils Absolute 0.1 0.0 - 0.1 K/uL   Immature Granulocytes 1 %   Abs Immature Granulocytes 0.13 (H)  0.00 - 0.07 K/uL    Comment: Performed at [facility], [place] [address] [place], [place] [address]  Comprehensive metabolic panel     Status: Abnormal   Collection Time: 07/26/19  3:22 AM  Result Value Ref Range   Sodium 139 135 - 145 mmol/L   Potassium 3.4 (L) 3.5 - 5.1 mmol/L   Chloride 103 98 - 111 mmol/L   CO2 29 22 - 32 mmol/L   Glucose, Bld 113 (H) 70 - 99 mg/dL   BUN 5 (L) 8 - 23 mg/dL   Creatinine, Ser 0.73 0.44 - 1.00 mg/dL   Calcium 7.9 (L) 8.9 - 10.3 mg/dL   Total Protein 5.4 (L) 6.5 - 8.1 g/dL   Albumin 2.9 (L) 3.5 - 5.0 g/dL   AST 16 15 - 41 U/L   ALT 18 0 - 44 U/L   Alkaline Phosphatase 66 38 - 126 U/L   Total Bilirubin 0.6 0.3 - 1.2 mg/dL   GFR calc non Af Amer >60 >60 mL/min   GFR calc Af Amer >60 >60 mL/min   Anion gap 7 5 - 15    Comment: Performed at [facility], [place] [address] [place], [place] [address]    Studies/Results: Ct Entero Conni Slipper Contast  Result Date: 07/25/2019 CLINICAL DATA:  Inpatient. Melena. Fever. Leukocytosis. Gastric ulcer on upper endoscopy. Segmental wall thickening in the ileum on recent CT. EXAM: CT ABDOMEN AND PELVIS WITH CONTRAST (ENTEROGRAPHY) TECHNIQUE: Multidetector CT of the abdomen and pelvis during bolus administration of intravenous contrast. Negative oral contrast was given. CONTRAST:  144m OMNIPAQUE IOHEXOL 300 MG/ML  SOLN COMPARISON:  07/22/2019 CT abdomen/pelvis. FINDINGS: Lower chest: No significant pulmonary nodules or acute consolidative airspace disease. Hepatobiliary: Normal liver size.  No liver mass. Cholecystectomy. No biliary ductal dilatation. Pancreas: Normal, with no mass or duct dilation. Spleen: Normal size. No mass. Adrenals/Urinary Tract: Normal adrenals. Subcentimeter hypodense renal cortical lesion in the interpolar left kidney is too small to characterize and is unchanged, requiring no follow-up. No additional renal lesions. Mild fullness of the central renal collecting  systems without overt hydronephrosis. Normal bladder. Stomach/Bowel: Surgical clips adjacent to the gastric cardia, potentially from prior fundoplication. Small hiatal hernia. Otherwise normal stomach. Normal caliber small bowel. There are numerous tiny diverticula throughout mid to distal ileum. There is focal short segment wall thickening with adjacent mesenteric fat stranding within a right pelvic ileal loop (series 3/image 180) associated with a tiny ileal diverticulum, compatible with acute ileal diverticulitis. The previously visualized tiny foci of free air in the adjacent mesentery have resolved. The wall thickening and inflammatory fat stranding is otherwise similar since 07/22/2019. No discrete abscess. No additional sites of small bowel wall thickening or mucosal hyperenhancement. No small bowel mass or fistula. Normal terminal ileum. Normal appendix. Mild sigmoid diverticulosis, with no large bowel wall thickening or significant pericolonic fat stranding. No mucosal hyperenhancement in the large bowel. Vascular/Lymphatic: Mildly atherosclerotic nonaneurysmal abdominal aorta. Patent portal, splenic, hepatic and renal veins. No pathologically enlarged lymph nodes in the abdomen or pelvis. Reproductive: Status post hysterectomy, with no abnormal findings at the vaginal cuff. No adnexal mass. Other: No pneumoperitoneum, ascites or focal fluid collection. Musculoskeletal: No aggressive appearing focal osseous lesions. IMPRESSION: 1. Acute diverticulitis of the small bowel involving a right pelvic loop of ileum. Previously noted tiny foci of free air in the adjacent mesentery on recent 07/22/2019 CT study have resolved. No discrete abscess. Inflammatory changes otherwise not substantially changed in the interval. 2. Numerous tiny diverticula throughout the mid to distal ileum. Mild sigmoid diverticulosis. No colonic diverticulitis. 3. Small hiatal hernia 4.  Aortic Atherosclerosis (ICD10-I70.0). Electronically  Signed   By: Ilona Sorrel M.D.   On: 07/25/2019 13:11    Medications: I have reviewed the patient's current medications.  Assessment: Acute diverticulitis of small bowel involving right pelvic loop of ileum-normal ESR, elevated CRP of 30.72, mild leukocytosis WBC count 12.3 Previously noted tiny foci of free air in the adjacent mesentery has resolved No discrete abscesses Numerous tiny diverticula throughout mid to distal ileum Mild sigmoid diverticulosis Small hiatal hernia Small gastric ulcer-on PPI once a day  Plan: Start regular diet, continue IV antibiotics for another 24 hours, if she is able to tolerate regular diet, discharge home on Augmentin for a total of 10 days. Patient will need bulk forming laxative such as Metamucil, psyllium husk, Benefiber once to twice a day on a regular basis. Okay to discharge in a.m. if stable.  Ronnette Juniper, MD 07/26/2019, 9:54 AM

## 2019-07-26 NOTE — Care Management Important Message (Signed)
Important Message  Patient Details IM Letter given to Kathrin Greathouse SW to present to the Patient Name: [name] MRN: [id number] Date of Birth: December 23, 1945   Medicare Important Message Given:  Yes     Kerin Salen 07/26/2019, 11:14 AM

## 2019-07-26 NOTE — Progress Notes (Signed)
PROGRESS NOTE    [name]  [id number]  DOB: [birth year]  DOA: 07/22/2019 PCP: Alroy Dust, L.Marlou Sa, MD  Brief Narrative:  73 y.o.femalewih h/o IBS-alternating constipation and diarrhea, concussion with residual memory decline, malignant melanoma, hypertension, depression  presented with c/ofatigue, abdominal pain, diarrhea with dark tarry stool. Patient has had a history of what appears to be Nissen fundoplication and has a hard time vomiting but was retching and also noticed blood and small clots in her vomit. No prior history of GI bleed.Last colonoscopy was performed in 2015 by Dr. Amedeo Plenty for chronic diarrhea,the entire colon appeared unremarkable and random biopsies were normal ED Course:Patient reportedly was febrile up to 102 ( not documented) and normotensive. Labs showed leukocytosis of 18.9 with stable hemoglobin of 12.4, BUN of 34 and stable creatinine of 0.84. Fecal occult blood positive. CT on admission showed mucosal thickening and distention of short segment of distal ileum with adjacent mesenteric stranding and 2 tiny gas bubbles along with cluster of small marked rounded lymph nodes in adjacent mesentery and mildercircumferential mucosal thickening of upstream ileum,which may represent infection/inflammatory enteritis or potential malignancy and microperforation cannot be excluded.Patient was started on Tigecycline given allergies to penicillin and flagyl. EGD completed on 07/24/2019 by Dr. Michail Sermon revealing Non-bleeding gastric ulcer with no stigmata of bleeding and Acute gastritis. ?Biopsy sent. She had retching during the hospital course however appears totolerate a full liquid diet at this time.Patient is on IV Meropenem/PPI daily now and symptomatically better. CT enterography done per GI on 10/12 revealing acute diverticulitis but improvement in previously noted tiny foci of free air in the adjacent mesentery. No discrete abscess. Inflammatory changes otherwise not  substantially changed in the interval..GI feels she is not a good candidate for capsule endoscopy at this time with ongoing inflammation and possible microperforationbut considering as an outpatient after she completes antibiotics.  Subjective:  Patient tolerating liquid diet well. Husband bedside and states seen by GI earlier. Has several questions about diverticulosis/high fiber diet  Objective: Vitals:   07/25/19 0514 07/25/19 1446 07/25/19 1958 07/26/19 0510  BP: (!) 117/59 114/65 131/63 (!) 154/69  Pulse: (!) 52 77 (!) 55 (!) 51  Resp: 16 15 18 16   Temp: 98.7 F (37.1 C) 99.8 F (37.7 C) 98.6 F (37 C) 98.2 F (36.8 C)  TempSrc:  Oral    SpO2: 100% 97% 100% 98%  Weight:      Height:        Intake/Output Summary (Last 24 hours) at 07/26/2019 0844 Last data filed at 07/26/2019 0545 Gross per 24 hour  Intake 2177.62 ml  Output --  Net 2177.62 ml   Filed Weights   07/22/19 1844  Weight: 88.5 kg    Physical Examination:  General exam: Appears calm and comfortable  Respiratory system: Clear to auscultation. Respiratory effort normal. Cardiovascular system: S1 & S2 heard, RRR. No JVD, murmurs, rubs, gallops or clicks. No pedal edema. Gastrointestinal system: Abdomen is nondistended, soft and nontender. No organomegaly or masses felt. Normal bowel sounds heard. Central nervous system: Alert and oriented. No focal neurological deficits. Extremities: Symmetric 5 x 5 power. Skin: No rashes, lesions or ulcers Psychiatry: Judgement and insight appear normal. Mood & affect appropriate.     Data Reviewed: I have personally reviewed following labs and imaging studies  CBC: Recent Labs  Lab 07/23/19 1236 07/23/19 1811 07/24/19 0720 07/25/19 0326 07/26/19 0322  WBC 13.8* 14.7* 11.7* 10.8* 12.3*  NEUTROABS  --   --   --  5.7  6.8  HGB 10.6* 11.0* 10.5* 11.5* 10.1*  HCT 33.0* 33.9* 33.3* 35.2* 31.6*  MCV 95.9 95.8 97.1 94.4 96.9  PLT 225 234 234 169 [id number]   Basic  Metabolic Panel: Recent Labs  Lab 07/22/19 1945 07/23/19 0328 07/24/19 0720 07/25/19 0326 07/26/19 0322  NA 139 139 138  --  139  K 3.6 3.6 3.6  --  3.4*  CL 106 106 104  --  103  CO2 23 21* 25  --  29  GLUCOSE 108* 106* 88  --  113*  BUN 34* 31* 21  --  5*  CREATININE 0.84 0.93 0.86  --  0.73  CALCIUM 8.7* 8.2* 7.8*  --  7.9*  MG  --   --   --  2.0  --   PHOS  --   --   --  2.9  --    GFR: Estimated Creatinine Clearance: 68.8 mL/min (by C-G formula based on SCr of 0.73 mg/dL). Liver Function Tests: Recent Labs  Lab 07/22/19 1945 07/26/19 0322  AST 16 16  ALT 28 18  ALKPHOS 69 66  BILITOT 0.4 0.6  PROT 6.2* 5.4*  ALBUMIN 3.4* 2.9*   Recent Labs  Lab 07/23/19 1236  LIPASE 20   No results for input(s): AMMONIA in the last 168 hours. Coagulation Profile: No results for input(s): INR, PROTIME in the last 168 hours. Cardiac Enzymes: No results for input(s): CKTOTAL, CKMB, CKMBINDEX, TROPONINI in the last 168 hours. BNP (last 3 results) No results for input(s): PROBNP in the last 8760 hours. HbA1C: No results for input(s): HGBA1C in the last 72 hours. CBG: No results for input(s): GLUCAP in the last 168 hours. Lipid Profile: No results for input(s): CHOL, HDL, LDLCALC, TRIG, CHOLHDL, LDLDIRECT in the last 72 hours. Thyroid Function Tests: No results for input(s): TSH, T4TOTAL, FREET4, T3FREE, THYROIDAB in the last 72 hours. Anemia Panel: No results for input(s): VITAMINB12, FOLATE, FERRITIN, TIBC, IRON, RETICCTPCT in the last 72 hours. Sepsis Labs: No results for input(s): PROCALCITON, LATICACIDVEN in the last 168 hours.  Recent Results (from the past 240 hour(s))  SARS CORONAVIRUS 2 (TAT 6-24 HRS) Nasopharyngeal Nasopharyngeal Swab     Status: None   Collection Time: 07/22/19  8:48 PM   Specimen: Nasopharyngeal Swab  Result Value Ref Range Status   SARS Coronavirus 2 NEGATIVE NEGATIVE Final    Comment: (NOTE) SARS-CoV-2 target nucleic acids are NOT  DETECTED. The SARS-CoV-2 RNA is generally detectable in upper and lower respiratory specimens during the acute phase of infection. Negative results do not preclude SARS-CoV-2 infection, do not rule out co-infections with other pathogens, and should not be used as the sole basis for treatment or other patient management decisions. Negative results must be combined with clinical observations, patient history, and epidemiological information. The expected result is Negative. Fact Sheet for Patients: SugarRoll.be Fact Sheet for Healthcare Providers: https://www.woods-mathews.com/ This test is not yet approved or cleared by the [place] FDA and  has been authorized for detection and/or diagnosis of SARS-CoV-2 by FDA under an Emergency Use Authorization (EUA). This EUA will remain  in effect (meaning this test can be used) for the duration of the COVID-19 declaration under Section 56 4(b)(1) of the Act, 21 U.S.C. section 360bbb-3(b)(1), unless the authorization is terminated or revoked sooner. Performed at [facility], [place] [address], [place], [place] [address]   Culture, blood (routine x 2)     Status: None (Preliminary result)   Collection Time: 07/22/19  11:04 PM   Specimen: BLOOD  Result Value Ref Range Status   Specimen Description   Final    BLOOD LEFT ANTECUBITAL Performed at [facility], [place] [address] [place], [place] [address]    Special Requests   Final    BOTTLES DRAWN AEROBIC AND ANAEROBIC Blood Culture results may not be optimal due to an inadequate volume of blood received in culture bottles Performed at [place] [address] [place], [place] [address]    Culture   Final    NO GROWTH 2 DAYS Performed at [place] [address] [place], [place] [address]    Report Status PENDING  Incomplete  Culture, blood (routine x 2)     Status: None (Preliminary result)   Collection  Time: 07/22/19 11:09 PM   Specimen: BLOOD LEFT HAND  Result Value Ref Range Status   Specimen Description   Final    BLOOD LEFT HAND Performed at [place] [address], [place], [place] [address]    Special Requests   Final    BOTTLES DRAWN AEROBIC AND ANAEROBIC Blood Culture adequate volume Performed at [place] [address], [place], [place] [address]    Culture   Final    NO GROWTH 2 DAYS Performed at [place] [address], [place], [place] [address]    Report Status PENDING  Incomplete      Radiology Studies: Ct Entero Abd/pelvis W Contast  Result Date: 07/25/2019 CLINICAL DATA:  Inpatient. Melena. Fever. Leukocytosis. Gastric ulcer on upper endoscopy. Segmental wall thickening in the ileum on recent CT. EXAM: CT ABDOMEN AND PELVIS WITH CONTRAST (ENTEROGRAPHY) TECHNIQUE: Multidetector CT of the abdomen and pelvis during bolus administration of intravenous contrast. Negative oral contrast was given. CONTRAST:  161mL OMNIPAQUE IOHEXOL 300 MG/ML  SOLN COMPARISON:  07/22/2019 CT abdomen/pelvis. FINDINGS: Lower chest: No significant pulmonary nodules or acute consolidative airspace disease. Hepatobiliary: Normal liver size. No liver mass. Cholecystectomy. No biliary ductal dilatation. Pancreas: Normal, with no mass or duct dilation. Spleen: Normal size. No mass. Adrenals/Urinary Tract: Normal adrenals. Subcentimeter hypodense renal cortical lesion in the interpolar left kidney is too small to characterize and is unchanged, requiring no follow-up. No additional renal lesions. Mild fullness of the central renal collecting systems without overt hydronephrosis. Normal bladder. Stomach/Bowel: Surgical clips adjacent to the gastric cardia, potentially from prior fundoplication. Small hiatal hernia. Otherwise normal stomach. Normal caliber small bowel. There are numerous tiny diverticula throughout mid to distal ileum. There is focal  short segment wall thickening with adjacent mesenteric fat stranding within a right pelvic ileal loop (series 3/image 180) associated with a tiny ileal diverticulum, compatible with acute ileal diverticulitis. The previously visualized tiny foci of free air in the adjacent mesentery have resolved. The wall thickening and inflammatory fat stranding is otherwise similar since 07/22/2019. No discrete abscess. No additional sites of small bowel wall thickening or mucosal hyperenhancement. No small bowel mass or fistula. Normal terminal ileum. Normal appendix. Mild sigmoid diverticulosis, with no large bowel wall thickening or significant pericolonic fat stranding. No mucosal hyperenhancement in the large bowel. Vascular/Lymphatic: Mildly atherosclerotic nonaneurysmal abdominal aorta. Patent portal, splenic, hepatic and renal veins. No pathologically enlarged lymph nodes in the abdomen or pelvis. Reproductive: Status post hysterectomy, with no abnormal findings at the vaginal cuff. No adnexal mass. Other: No pneumoperitoneum, ascites or focal fluid collection. Musculoskeletal: No aggressive appearing focal osseous lesions. IMPRESSION: 1. Acute diverticulitis of  the small bowel involving a right pelvic loop of ileum. Previously noted tiny foci of free air in the adjacent mesentery on recent 07/22/2019 CT study have resolved. No discrete abscess. Inflammatory changes otherwise not substantially changed in the interval. 2. Numerous tiny diverticula throughout the mid to distal ileum. Mild sigmoid diverticulosis. No colonic diverticulitis. 3. Small hiatal hernia 4.  Aortic Atherosclerosis (ICD10-I70.0). Electronically Signed   By: Ilona Sorrel M.D.   On: 07/25/2019 13:11        Scheduled Meds:  atorvastatin  40 mg Oral q1800   enoxaparin (LOVENOX) injection  40 mg Subcutaneous Q24H   hydrochlorothiazide  25 mg Oral Daily   levothyroxine  50 mcg Oral Q0600   metoprolol tartrate  50 mg Oral BID   montelukast   10 mg Oral Daily   pantoprazole  40 mg Oral Daily   polyethylene glycol  17 g Oral Daily   senna-docusate  2 tablet Oral BID   Continuous Infusions:  sodium chloride 50 mL/hr at 07/25/19 1752   meropenem (MERREM) IV 1 g (07/26/19 0545)    Assessment & Plan:    1.  Acute diverticulitis: Presented with fever and white count of 18k-->now 12k, afebrile. No c/o abdominal pain. Seen by GI , recommended to continue IV abx for another day and transition to oral Augmentin x10 days in am. Explained to avoid constipation (patient normally has BM once a week) and encourage fiber diet to prevent diverticulosis. Gi recommends bulk forming laxative such as Metamucil, psyllium husk, Benefiber once to twice a day upon discharge.   2. UGI bleed: Melena now resolved. Had green BM today. Hgb relatively stable. EGD showed non-bleeding gastric ulcer with no stigmata of bleeding and acute gastritis. Not on any anticoagulants at home. Continue PPI daily. D/C prophylactic lovenox  3. Chronic constipation: Senakot bid, Miralax, high fiber diet as discussed above  4. Hypokalemia: In the setting of HCTZ and suboptimal diet. Replace.   5. HTN/hyperlipidemia: resumed home meds  6. Hypothyroidism: synthroid  7. Anxiety disorder: On xanax prn at home  DVT prophylaxis: d/c Lovenox  Code Status: Full code Family / Patient Communication: d/c patient and husband bedside Disposition Plan: home in am     LOS: 3 days    Time spent:     Guilford Shi, MD [facility] Pager [phone]  If 7PM-7AM, please contact night-coverage [email or web address] Password [email or web address] 07/26/2019, 8:44 AM

## 2019-07-27 DIAGNOSIS — K922 Gastrointestinal hemorrhage, unspecified: Secondary | ICD-10-CM

## 2019-07-27 DIAGNOSIS — K2900 Acute gastritis without bleeding: Secondary | ICD-10-CM

## 2019-07-27 DIAGNOSIS — K5792 Diverticulitis of intestine, part unspecified, without perforation or abscess without bleeding: Secondary | ICD-10-CM

## 2019-07-27 DIAGNOSIS — E785 Hyperlipidemia, unspecified: Secondary | ICD-10-CM

## 2019-07-27 DIAGNOSIS — I10 Essential (primary) hypertension: Secondary | ICD-10-CM

## 2019-07-27 DIAGNOSIS — K2970 Gastritis, unspecified, without bleeding: Secondary | ICD-10-CM

## 2019-07-27 LAB — BASIC METABOLIC PANEL
Anion gap: 8 (ref 5–15)
BUN: 8 mg/dL (ref 8–23)
CO2: 28 mmol/L (ref 22–32)
Calcium: 8.1 mg/dL — ABNORMAL LOW (ref 8.9–10.3)
Chloride: 102 mmol/L (ref 98–111)
Creatinine, Ser: 0.71 mg/dL (ref 0.44–1.00)
GFR calc Af Amer: >60 mL/min (ref >60)
GFR calc non Af Amer: >60 mL/min (ref >60)
Glucose, Bld: 125 mg/dL — ABNORMAL HIGH (ref 70–99)
Potassium: 3.7 mmol/L (ref 3.5–5.1)
Sodium: 138 mmol/L (ref 135–145)

## 2019-07-27 MED ORDER — PANTOPRAZOLE SODIUM 40 MG PO TBEC: 40.0000 mg | DELAYED_RELEASE_TABLET | Freq: Every day | ORAL | 0 refills | Status: DC

## 2019-07-27 MED ORDER — PSYLLIUM 95 % PO PACK: 1.0000 | PACK | Freq: Two times a day (BID) | ORAL | 0 refills | Status: AC

## 2019-07-27 MED ORDER — CIPROFLOXACIN HCL 250 MG PO TABS: 250.0000 mg | ORAL_TABLET | Freq: Two times a day (BID) | ORAL | 0 refills | Status: AC

## 2019-07-27 MED ORDER — CIPROFLOXACIN HCL 500 MG PO TABS
250.0000 mg | ORAL_TABLET | Freq: Two times a day (BID) | ORAL | Status: DC
  Administered 2019-07-27: 250 mg via ORAL
  Filled 2019-07-27: qty 1

## 2019-07-27 NOTE — Discharge Summary (Addendum)
Physician Discharge Summary  [name] [id number] DOB: Apr 23, 1946 DOA: 07/22/2019  PCP: Alroy Dust, L.Marlou Sa, MD  Admit date: 07/22/2019 Discharge date: 07/27/2019  Admitted From: Home  Disposition:  Home   Recommendations for Outpatient Follow-up and new medication changes:  1. Follow up with Dr. Alroy Dust in 7 days.  2. Patient will take ciprofloxacin for the next 10 days, positive allergy to penicillins and metronidazole.  3. Patient will continue taking pantoprazole. 4. Added psyllium bid.  5. Follow up with GI in 2 to 3 weeks.   Home Health: no   Equipment/Devices: no    Discharge Condition: stable  CODE STATUS: full  Diet recommendation: Regular diet.  Brief/Interim Summary: 73 year old female who presented with abdominal pain.  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  She reported fatigue and increased abdominal pain.  Persistent symptoms for 24 hours, associated with diarrhea and dark stools.  Positive nausea and vomiting/ small amount of blood.  On her initial physical examination blood pressure 136/51, heart rate 75, respirate 16, oxygen saturation 99%, her lungs were clear to auscultation bilaterally, heart S1-S2 present and rhythmic, abdomen with diffuse tenderness worse in the epigastric and lower quadrants, no rebound or guarding, no lower extremity edema. Sodium 139, potassium 3.6, chloride 106, bicarb 23, glucose 108, BUN 34, creatinine 0.84, AST 16, ALT 28, white count 18.9, hemoglobin 12.4, hematocrit 37.7, platelets 273.  SARS COVID-19 was negative.  Fecal occult blood was positive.  CT of the abdomen with focal mucosal thickening and distention of short segment of distal ileum in the right pelvis and adjacent mesenteric stranding and 2 tiny gas bubbles.  Microperforation was not excluded.  Her chest radiograph was negative for infiltrates.   EKG was 61 bpm, rightward axis, normal intervals, sinus rhythm, no ST segment changes, negative T waves lead I and aVL.  Positive PAC.  Patient was admitted to the hospital with a working diagnosis of abdominal pain, melena due to acute colitis.  1.  Acute blood loss anemia due to acute diverticulitis and gastritis (upper and lower GI.).  Patient was admitted to the medical ward, she was placed on intravenous fluids and antibiotic therapy with meropenem, patient underwent further work-up with upper endoscopy which showed nonbleeding gastric ulcer with no stigmata of bleeding, acute gastritis.  CT enterography which revealed diverticulitis, no signs of perforation or abscess.  Patient was placed on proton pump inhibitors and her diet was advanced progressively, with good toleration.  Patient will continue ciprofloxacin for 10 more days, note that she is allergic to penicillins, and metronidazole.  Continue bulk forming laxatives.  Her hemoglobin and hematocrit remained stable, 10.1 and 31.6 respectively at discharge, no PRBC transfusions during hospitalization.  Continue aspirin.   2.  Chronic constipation.  Continue MiraLAX and bulk forming laxative.  3.  Hypokalemia.  Potassium was corrected, patient will resume her chlorothiazide at discharge.  4.  Hypertension.  Continue blood pressure control, with metoprolol and hctz.   5.  Dyslipidemia.  Continue statin therapy  6.  Hypothyroidism.  Continue levothyroxine.  7.  Anxiety disorder.  Continue as needed alprazolam.   Discharge Diagnoses:  Principal Problem:   Diverticulitis Active Problems:   Hypertension   Melena   Dyslipidemia   Essential hypertension   Upper GI bleed   Gastritis    Discharge Instructions   Allergies as of 07/27/2019      Reactions   Codeine Nausea And  Vomiting   Penicillins Swelling   Flagyl [metronidazole] Hives   Sulfa Antibiotics Nausea And Vomiting      Medication List    STOP taking these  medications   benzonatate 100 MG capsule Commonly known as: TESSALON   hydrocortisone valerate ointment 0.2 % Commonly known as: Westcort   HYDROmorphone 2 MG tablet Commonly known as: Dilaudid   ibuprofen 200 MG tablet Commonly known as: ADVIL     TAKE these medications   ALAWAY OP Place 5 drops into both eyes daily as needed (for allergies).   ALPRAZolam 1 MG tablet Commonly known as: XANAX Take 2 mg by mouth 2 (two) times daily as needed for anxiety.   aspirin EC 81 MG tablet Take 81 mg by mouth at bedtime.   atorvastatin 40 MG tablet Commonly known as: LIPITOR Take 40 mg by mouth daily at 6 PM.   ciprofloxacin 250 MG tablet Commonly known as: CIPRO Take 1 tablet (250 mg total) by mouth 2 (two) times daily for 10 days.   conjugated estrogens vaginal cream Commonly known as: PREMARIN Place 1 Applicatorful vaginally once a week.   cyclobenzaprine 5 MG tablet Commonly known as: FLEXERIL Take 5 mg by mouth 3 (three) times daily as needed for muscle spasms.   hydrochlorothiazide 25 MG tablet Commonly known as: HYDRODIURIL Take 25 mg by mouth daily.   levothyroxine 50 MCG tablet Commonly known as: SYNTHROID Take 50 mcg by mouth daily before breakfast.   meloxicam 15 MG tablet Commonly known as: MOBIC Take 15 mg by mouth daily as needed for pain.   metoprolol tartrate 50 MG tablet Commonly known as: LOPRESSOR Take 50 mg by mouth 2 (two) times daily.   montelukast 10 MG tablet Commonly known as: SINGULAIR Take 1 tablet by mouth daily.   pantoprazole 40 MG tablet Commonly known as: PROTONIX Take 1 tablet (40 mg total) by mouth daily. Start taking on: July 28, 2019   ProAir HFA 108 (90 Base) MCG/ACT inhaler Generic drug: albuterol Inhale 2 puffs into the lungs every 4 (four) hours as needed.   promethazine 25 MG tablet Commonly known as: PHENERGAN Take 1 tablet (25 mg total) by mouth every 8 (eight) hours as needed for nausea or vomiting.    psyllium 95 % Pack Commonly known as: HYDROCIL/METAMUCIL Take 1 packet by mouth 2 (two) times daily.   TYLENOL PO Take 2 tablets by mouth daily as needed (for pain or headache).   zolpidem 10 MG tablet Commonly known as: AMBIEN Take 10 mg by mouth at bedtime as needed for sleep.       Allergies  Allergen Reactions  . Codeine Nausea And Vomiting  . Penicillins Swelling  . Flagyl [Metronidazole] Hives  . Sulfa Antibiotics Nausea And Vomiting    Consultations:  GI    Procedures/Studies: Dg Chest 2 View  Result Date: 07/22/2019 CLINICAL DATA:  Lower abdominal pain with hematemesis and melena. EXAM: CHEST - 2 VIEW COMPARISON:  Chest x-ray dated April 05, 2019. FINDINGS: The heart size and mediastinal contours are within normal limits. Both lungs are clear. The visualized skeletal structures are unremarkable. Unchanged surgical clips near the gastroesophageal junction. Prior cholecystectomy. IMPRESSION: No active cardiopulmonary disease. Electronically Signed   By: Titus Dubin M.D.   On: 07/22/2019 20:38   Ct Abdomen Pelvis W Contrast  Result Date: 07/22/2019 CLINICAL DATA:  GI bleeding. EXAM: CT ABDOMEN AND PELVIS WITH CONTRAST TECHNIQUE: Multidetector CT imaging of the abdomen and pelvis was performed using the standard  protocol following bolus administration of intravenous contrast. CONTRAST:  127mL OMNIPAQUE IOHEXOL 300 MG/ML  SOLN COMPARISON:  None. FINDINGS: Lower chest: Postsurgical changes at the GE junction. Hepatobiliary: No focal liver abnormality is seen. Status post cholecystectomy. No biliary dilatation. Pancreas: Unremarkable. No pancreatic ductal dilatation or surrounding inflammatory changes. Spleen: Normal in size without focal abnormality. Adrenals/Urinary Tract: Adrenal glands are unremarkable. Kidneys are normal, without renal calculi, focal lesion, or hydronephrosis. Bladder is unremarkable. Stomach/Bowel: Probable prior Nissen fundoplication. No evidence of  small-bowel obstruction. Focal mucosal thickening and distension of short segment of distal ileum in the right pelvis with adjacent mesenteric stranding and 2 tiny gas bubbles, image 72/94, axial images and 82/151, coronal images. A cluster of small marked rounded lymph nodes in the adjacent mesentery. Milder circumferential mucosal thickening of the upstream ileum, image 72/94. Vascular/Lymphatic: No significant vascular findings are present. No enlarged abdominal or pelvic lymph nodes. Reproductive: Status post hysterectomy. No adnexal masses. Other: No abdominal wall hernia or abnormality. No abdominopelvic ascites. Musculoskeletal: No acute or significant osseous findings. IMPRESSION: 1. Focal mucosal thickening and distension of short segment of distal ileum in the right pelvis with adjacent mesenteric stranding and 2 tiny gas bubbles. This may represent a focal area of infectious/inflammatory enteritis, or potentially malignancy. Micro perforation cannot be excluded. Milder circumferential mucosal thickening of the upstream ileum is also present. 2. Cluster of small, few mm, but rounded lymph nodes in the adjacent mesentery. 3. No evidence of small-bowel obstruction. 4. Postsurgical changes at the GE junction. 5. No evidence of acute abnormalities within the solid abdominal organs. Electronically Signed   By: Fidela Salisbury M.D.   On: 07/22/2019 22:01   Ct Entero Abd/pelvis W Contast  Result Date: 07/25/2019 CLINICAL DATA:  Inpatient. Melena. Fever. Leukocytosis. Gastric ulcer on upper endoscopy. Segmental wall thickening in the ileum on recent CT. EXAM: CT ABDOMEN AND PELVIS WITH CONTRAST (ENTEROGRAPHY) TECHNIQUE: Multidetector CT of the abdomen and pelvis during bolus administration of intravenous contrast. Negative oral contrast was given. CONTRAST:  126mL OMNIPAQUE IOHEXOL 300 MG/ML  SOLN COMPARISON:  07/22/2019 CT abdomen/pelvis. FINDINGS: Lower chest: No significant pulmonary nodules or acute  consolidative airspace disease. Hepatobiliary: Normal liver size. No liver mass. Cholecystectomy. No biliary ductal dilatation. Pancreas: Normal, with no mass or duct dilation. Spleen: Normal size. No mass. Adrenals/Urinary Tract: Normal adrenals. Subcentimeter hypodense renal cortical lesion in the interpolar left kidney is too small to characterize and is unchanged, requiring no follow-up. No additional renal lesions. Mild fullness of the central renal collecting systems without overt hydronephrosis. Normal bladder. Stomach/Bowel: Surgical clips adjacent to the gastric cardia, potentially from prior fundoplication. Small hiatal hernia. Otherwise normal stomach. Normal caliber small bowel. There are numerous tiny diverticula throughout mid to distal ileum. There is focal short segment wall thickening with adjacent mesenteric fat stranding within a right pelvic ileal loop (series 3/image 180) associated with a tiny ileal diverticulum, compatible with acute ileal diverticulitis. The previously visualized tiny foci of free air in the adjacent mesentery have resolved. The wall thickening and inflammatory fat stranding is otherwise similar since 07/22/2019. No discrete abscess. No additional sites of small bowel wall thickening or mucosal hyperenhancement. No small bowel mass or fistula. Normal terminal ileum. Normal appendix. Mild sigmoid diverticulosis, with no large bowel wall thickening or significant pericolonic fat stranding. No mucosal hyperenhancement in the large bowel. Vascular/Lymphatic: Mildly atherosclerotic nonaneurysmal abdominal aorta. Patent portal, splenic, hepatic and renal veins. No pathologically enlarged lymph nodes in the abdomen or pelvis. Reproductive: Status  post hysterectomy, with no abnormal findings at the vaginal cuff. No adnexal mass. Other: No pneumoperitoneum, ascites or focal fluid collection. Musculoskeletal: No aggressive appearing focal osseous lesions. IMPRESSION: 1. Acute  diverticulitis of the small bowel involving a right pelvic loop of ileum. Previously noted tiny foci of free air in the adjacent mesentery on recent 07/22/2019 CT study have resolved. No discrete abscess. Inflammatory changes otherwise not substantially changed in the interval. 2. Numerous tiny diverticula throughout the mid to distal ileum. Mild sigmoid diverticulosis. No colonic diverticulitis. 3. Small hiatal hernia 4.  Aortic Atherosclerosis (ICD10-I70.0). Electronically Signed   By: Ilona Sorrel M.D.   On: 07/25/2019 13:11   Dg Abd 2 Views  Result Date: 07/23/2019 CLINICAL DATA:  Upper abdominal pain. EXAM: ABDOMEN - 2 VIEW COMPARISON:  None. FINDINGS: The bowel gas pattern is normal. There is no evidence of free air. No radio-opaque calculi or other significant radiographic abnormality is seen. IMPRESSION: Negative. Electronically Signed   By: Marijo Conception M.D.   On: 07/23/2019 10:44   Vas [place] Lower Extremity Venous (dvt)  Result Date: 07/24/2019  Lower Venous Study Indications: Pain.  Comparison Study: no prior Performing Technologist: Abram Sander RVS  Examination Guidelines: A complete evaluation includes B-mode imaging, spectral Doppler, color Doppler, and power Doppler as needed of all accessible portions of each vessel. Bilateral testing is considered an integral part of a complete examination. Limited examinations for reoccurring indications may be performed as noted.  +---------+---------------+---------+-----------+----------+--------------+ RIGHT    CompressibilityPhasicitySpontaneityPropertiesThrombus Aging +---------+---------------+---------+-----------+----------+--------------+ CFV      Full           Yes      Yes                                 +---------+---------------+---------+-----------+----------+--------------+ SFJ      Full                                                        +---------+---------------+---------+-----------+----------+--------------+  FV Prox  Full                                                        +---------+---------------+---------+-----------+----------+--------------+ FV Mid   Full                                                        +---------+---------------+---------+-----------+----------+--------------+ FV DistalFull                                                        +---------+---------------+---------+-----------+----------+--------------+ PFV      Full                                                        +---------+---------------+---------+-----------+----------+--------------+  POP      Full           Yes      Yes                                 +---------+---------------+---------+-----------+----------+--------------+ PTV      Full                                                        +---------+---------------+---------+-----------+----------+--------------+ PERO                                                  Not visualized +---------+---------------+---------+-----------+----------+--------------+   +---------+---------------+---------+-----------+----------+--------------+ LEFT     CompressibilityPhasicitySpontaneityPropertiesThrombus Aging +---------+---------------+---------+-----------+----------+--------------+ CFV      Full           Yes      Yes                                 +---------+---------------+---------+-----------+----------+--------------+ SFJ      Full                                                        +---------+---------------+---------+-----------+----------+--------------+ FV Prox  Full                                                        +---------+---------------+---------+-----------+----------+--------------+ FV Mid   Full                                                        +---------+---------------+---------+-----------+----------+--------------+ FV DistalFull                                                         +---------+---------------+---------+-----------+----------+--------------+ PFV      Full                                                        +---------+---------------+---------+-----------+----------+--------------+ POP      Full           Yes      Yes                                 +---------+---------------+---------+-----------+----------+--------------+  PTV      Full                                                        +---------+---------------+---------+-----------+----------+--------------+ PERO     Full                                                        +---------+---------------+---------+-----------+----------+--------------+     Summary: Right: There is no evidence of deep vein thrombosis in the lower extremity. No cystic structure found in the popliteal fossa. Left: There is no evidence of deep vein thrombosis in the lower extremity. No cystic structure found in the popliteal fossa.  *See table(s) above for measurements and observations. Electronically signed by Ruta Hinds MD on 07/24/2019 at 12:34:42 PM.    Final       Procedures: Upper endoscopy   Subjective: Patient is feeling better, no nausea or vomiting and continue to improve abdominal pain.   Discharge Exam: Vitals:   07/26/19 2102 07/27/19 0604  BP: 134/66 (!) 123/57  Pulse: 64 63  Resp: 16 16  Temp: 98.4 F (36.9 C) 98.5 F (36.9 C)  SpO2: 100% 99%   Vitals:   07/26/19 0510 07/26/19 1258 07/26/19 2102 07/27/19 0604  BP: (!) 154/69 (!) 115/57 134/66 (!) 123/57  Pulse: (!) 51 (!) 54 64 63  Resp: 16 17 16 16   Temp: 98.2 F (36.8 C)  98.4 F (36.9 C) 98.5 F (36.9 C)  TempSrc:   Oral Oral  SpO2: 98% 97% 100% 99%  Weight:      Height:        General: Not in pain or dyspnea.  Neurology: Awake and alert, non focal  E ENT: no pallor, no icterus, oral mucosa moist Cardiovascular: No JVD. S1-S2 present, rhythmic, no gallops, rubs, or murmurs. No  lower extremity edema. Pulmonary: vesicular breath sounds bilaterally, adequate air movement, no wheezing, rhonchi or rales. Gastrointestinal. Abdomen with no organomegaly, non tender, no rebound or guarding Skin. No rashes Musculoskeletal: no joint deformities   The results of significant diagnostics from this hospitalization (including imaging, microbiology, ancillary and laboratory) are listed below for reference.     Microbiology: Recent Results (from the past 240 hour(s))  SARS CORONAVIRUS 2 (TAT 6-24 HRS) Nasopharyngeal Nasopharyngeal Swab     Status: None   Collection Time: 07/22/19  8:48 PM   Specimen: Nasopharyngeal Swab  Result Value Ref Range Status   SARS Coronavirus 2 NEGATIVE NEGATIVE Final    Comment: (NOTE) SARS-CoV-2 target nucleic acids are NOT DETECTED. The SARS-CoV-2 RNA is generally detectable in upper and lower respiratory specimens during the acute phase of infection. Negative results do not preclude SARS-CoV-2 infection, do not rule out co-infections with other pathogens, and should not be used as the sole basis for treatment or other patient management decisions. Negative results must be combined with clinical observations, patient history, and epidemiological information. The expected result is Negative. Fact Sheet for Patients: SugarRoll.be Fact Sheet for Healthcare Providers: https://www.woods-mathews.com/ This test is not yet approved or cleared by the [place] FDA and  has been authorized for detection and/or diagnosis of SARS-CoV-2 by FDA under  an Emergency Use Authorization (EUA). This EUA will remain  in effect (meaning this test can be used) for the duration of the COVID-19 declaration under Section 56 4(b)(1) of the Act, 21 U.S.C. section 360bbb-3(b)(1), unless the authorization is terminated or revoked sooner. Performed at [facility], [place] [address], [place], [place] [address]   Culture,  blood (routine x 2)     Status: None (Preliminary result)   Collection Time: 07/22/19 11:04 PM   Specimen: BLOOD  Result Value Ref Range Status   Specimen Description   Final    BLOOD LEFT ANTECUBITAL Performed at [facility], [place] [address] [place], [place] [address]    Special Requests   Final    BOTTLES DRAWN AEROBIC AND ANAEROBIC Blood Culture results may not be optimal due to an inadequate volume of blood received in culture bottles Performed at [facility] [address], [place], [place] [address]    Culture   Final    NO GROWTH 3 DAYS Performed at [facility], [place] [address] [place], [place] [address]    Report Status PENDING  Incomplete  Culture, blood (routine x 2)     Status: None (Preliminary result)   Collection Time: 07/22/19 11:09 PM   Specimen: BLOOD LEFT HAND  Result Value Ref Range Status   Specimen Description   Final    BLOOD LEFT HAND Performed at [facility] [address] [place], [place] [address]    Special Requests   Final    BOTTLES DRAWN AEROBIC AND ANAEROBIC Blood Culture adequate volume Performed at [facility] [address], [place], [place] [address]    Culture   Final    NO GROWTH 3 DAYS Performed at [facility], [place] [address] [place], [place] [address]    Report Status PENDING  Incomplete     Labs: BNP (last 3 results) Recent Labs    07/23/19 1236  BNP [id number]   Basic Metabolic Panel: Recent Labs  Lab 07/22/19 1945 07/23/19 0328 07/24/19 0720 07/25/19 0326 07/26/19 0322 07/27/19 0303  NA 139 139 138  --  139 138  K 3.6 3.6 3.6  --  3.4* 3.7  CL 106 106 104  --  103 102  CO2 23 21* 25  --  29 28  GLUCOSE 108* 106* 88  --  113* 125*  BUN 34* 31* 21  --  5* 8  CREATININE 0.84 0.93 0.86  --  0.73 0.71  CALCIUM 8.7* 8.2* 7.8*  --  7.9* 8.1*  MG  --   --   --  2.0  --   --   PHOS  --   --   --  2.9  --   --    Liver Function Tests: Recent  Labs  Lab 07/22/19 1945 07/26/19 0322  AST 16 16  ALT 28 18  ALKPHOS 69 66  BILITOT 0.4 0.6  PROT 6.2* 5.4*  ALBUMIN 3.4* 2.9*   Recent Labs  Lab 07/23/19 1236  LIPASE 20   No results for input(s): AMMONIA in the last 168 hours. CBC: Recent Labs  Lab 07/23/19 1236 07/23/19 1811 07/24/19 0720 07/25/19 0326 07/26/19 0322  WBC 13.8* 14.7* 11.7* 10.8* 12.3*  NEUTROABS  --   --   --  5.7 6.8  HGB 10.6* 11.0* 10.5* 11.5* 10.1*  HCT 33.0* 33.9* 33.3* 35.2* 31.6*  MCV 95.9 95.8 97.1 94.4 96.9  PLT  225 234 234 169 224   Cardiac Enzymes: No results for input(s): CKTOTAL, CKMB, CKMBINDEX, TROPONINI in the last 168 hours. BNP: Invalid input(s): POCBNP CBG: No results for input(s): GLUCAP in the last 168 hours. D-Dimer No results for input(s): DDIMER in the last 72 hours. Hgb A1c No results for input(s): HGBA1C in the last 72 hours. Lipid Profile No results for input(s): CHOL, HDL, LDLCALC, TRIG, CHOLHDL, LDLDIRECT in the last 72 hours. Thyroid function studies No results for input(s): TSH, T4TOTAL, T3FREE, THYROIDAB in the last 72 hours.  Invalid input(s): FREET3 Anemia work up No results for input(s): VITAMINB12, FOLATE, FERRITIN, TIBC, IRON, RETICCTPCT in the last 72 hours. Urinalysis    Component Value Date/Time   COLORURINE YELLOW 05/28/2014 2150   APPEARANCEUR CLEAR 05/28/2014 2150   LABSPEC 1.019 05/28/2014 2150   PHURINE 6.5 05/28/2014 2150   GLUCOSEU NEGATIVE 05/28/2014 2150   HGBUR NEGATIVE 05/28/2014 2150   BILIRUBINUR NEGATIVE 05/28/2014 2150   KETONESUR NEGATIVE 05/28/2014 2150   PROTEINUR NEGATIVE 05/28/2014 2150   UROBILINOGEN 0.2 05/28/2014 2150   NITRITE NEGATIVE 05/28/2014 2150   LEUKOCYTESUR NEGATIVE 05/28/2014 2150   Sepsis Labs Invalid input(s): PROCALCITONIN,  WBC,  LACTICIDVEN Microbiology Recent Results (from the past 240 hour(s))  SARS CORONAVIRUS 2 (TAT 6-24 HRS) Nasopharyngeal Nasopharyngeal Swab     Status: None   Collection Time:  07/22/19  8:48 PM   Specimen: Nasopharyngeal Swab  Result Value Ref Range Status   SARS Coronavirus 2 NEGATIVE NEGATIVE Final    Comment: (NOTE) SARS-CoV-2 target nucleic acids are NOT DETECTED. The SARS-CoV-2 RNA is generally detectable in upper and lower respiratory specimens during the acute phase of infection. Negative results do not preclude SARS-CoV-2 infection, do not rule out co-infections with other pathogens, and should not be used as the sole basis for treatment or other patient management decisions. Negative results must be combined with clinical observations, patient history, and epidemiological information. The expected result is Negative. Fact Sheet for Patients: SugarRoll.be Fact Sheet for Healthcare Providers: https://www.woods-mathews.com/ This test is not yet approved or cleared by the [place] FDA and  has been authorized for detection and/or diagnosis of SARS-CoV-2 by FDA under an Emergency Use Authorization (EUA). This EUA will remain  in effect (meaning this test can be used) for the duration of the COVID-19 declaration under Section 56 4(b)(1) of the Act, 21 U.S.C. section 360bbb-3(b)(1), unless the authorization is terminated or revoked sooner. Performed at [facility], [place] [address], [place], [place] [address]   Culture, blood (routine x 2)     Status: None (Preliminary result)   Collection Time: 07/22/19 11:04 PM   Specimen: BLOOD  Result Value Ref Range Status   Specimen Description   Final    BLOOD LEFT ANTECUBITAL Performed at [facility], [place] [address], [place], [place] [address]    Special Requests   Final    BOTTLES DRAWN AEROBIC AND ANAEROBIC Blood Culture results may not be optimal due to an inadequate volume of blood received in culture bottles Performed at [place] [address], [place], [place] [address]    Culture   Final    NO GROWTH 3  DAYS Performed at [facility], [place] [address] [place], [place] [address]    Report Status PENDING  Incomplete  Culture, blood (routine x 2)     Status: None (Preliminary result)   Collection Time: 07/22/19 11:09 PM   Specimen: BLOOD LEFT HAND  Result Value Ref Range Status   Specimen Description   Final    BLOOD LEFT HAND Performed at [place] [address] [place], [place] [address]    Special Requests   Final    BOTTLES DRAWN AEROBIC AND ANAEROBIC Blood Culture adequate volume Performed at [facility] [place] [address] [place], [place] [address]    Culture   Final    NO GROWTH 3 DAYS Performed at [facility], [place] [address], [place], [place] [address]    Report Status PENDING  Incomplete     Time coordinating discharge: 45 minutes  SIGNED:   Tawni Millers, MD  [facility] 07/27/2019, 11:40 AM

## 2019-07-27 NOTE — Progress Notes (Signed)
Subjective: Patient reports several semi-formed bowel movements yesterday, improvement in abdominal pain and tolerance to solid diet.  Objective: Vital signs in last 24 hours: Temp:  [98.4 F (36.9 C)-98.5 F (36.9 C)] 98.5 F (36.9 C) (10/14 0604) Pulse Rate:  [54-64] 63 (10/14 0604) Resp:  [16-17] 16 (10/14 0604) BP: (115-134)/(57-66) 123/57 (10/14 0604) SpO2:  [97 %-100 %] 99 % (10/14 0604) Weight change:  Last BM Date: 07/26/19  PE: Appears comfortable GENERAL: Mild pallor ABDOMEN: Soft, nondistended, nontender EXTREMITIES: No deformity  Lab Results: Results for orders placed or performed during the hospital encounter of 07/22/19 (from the past 48 hour(s))  Sedimentation rate     Status: None   Collection Time: 07/25/19  9:55 AM  Result Value Ref Range   Sed Rate 20 0 - 22 mm/hr    Comment: Performed at [facility], [place] [address], [place], [place] [address]  High sensitivity CRP     Status: Abnormal   Collection Time: 07/25/19  9:55 AM  Result Value Ref Range   CRP, High Sensitivity 30.72 (H) 0.00 - 3.00 mg/L    Comment: (NOTE) Results confirmed on dilution.         Relative Risk for Future Cardiovascular Event                             Low                 <1.00                             Average       1.00 - 3.00                             High                >3.00 Performed At: [facility], [place], [place] [id number] Rush Farmer MD [id number]   CBC with Differential/Platelet     Status: Abnormal   Collection Time: 07/26/19  3:22 AM  Result Value Ref Range   WBC 12.3 (H) 4.0 - 10.5 K/uL   RBC 3.26 (L) 3.87 - 5.11 MIL/uL   Hemoglobin 10.1 (L) 12.0 - 15.0 g/dL   HCT 31.6 (L) 36.0 - 46.0 %   MCV 96.9 80.0 - 100.0 fL   MCH 31.0 26.0 - 34.0 pg   MCHC 32.0 30.0 - 36.0 g/dL   RDW 13.4 11.5 - 15.5 %   Platelets 224 150 - 400 K/uL   nRBC 0.0 0.0 - 0.2 %   Neutrophils Relative % 54 %   Neutro Abs 6.8 1.7 - 7.7  K/uL   Lymphocytes Relative 30 %   Lymphs Abs 3.7 0.7 - 4.0 K/uL   Monocytes Relative 10 %   Monocytes Absolute 1.2 (H) 0.1 - 1.0 K/uL   Eosinophils Relative 4 %   Eosinophils Absolute 0.4 0.0 - 0.5 K/uL   Basophils Relative 1 %   Basophils Absolute 0.1 0.0 - 0.1 K/uL   Immature Granulocytes 1 %   Abs Immature Granulocytes 0.13 (H) 0.00 - 0.07 K/uL    Comment: Performed at [facility], [place] [address] [place], [place] [address]  Comprehensive metabolic panel     Status: Abnormal   Collection Time: 07/26/19  3:22 AM  Result Value Ref Range  Sodium 139 135 - 145 mmol/L   Potassium 3.4 (L) 3.5 - 5.1 mmol/L   Chloride 103 98 - 111 mmol/L   CO2 29 22 - 32 mmol/L   Glucose, Bld 113 (H) 70 - 99 mg/dL   BUN 5 (L) 8 - 23 mg/dL   Creatinine, Ser 0.73 0.44 - 1.00 mg/dL   Calcium 7.9 (L) 8.9 - 10.3 mg/dL   Total Protein 5.4 (L) 6.5 - 8.1 g/dL   Albumin 2.9 (L) 3.5 - 5.0 g/dL   AST 16 15 - 41 U/L   ALT 18 0 - 44 U/L   Alkaline Phosphatase 66 38 - 126 U/L   Total Bilirubin 0.6 0.3 - 1.2 mg/dL   GFR calc non Af Amer >60 >60 mL/min   GFR calc Af Amer >60 >60 mL/min   Anion gap 7 5 - 15    Comment: Performed at [facility], [place] [address] [place], [place] [id number]  Basic metabolic panel     Status: Abnormal   Collection Time: 07/27/19  3:03 AM  Result Value Ref Range   Sodium 138 135 - 145 mmol/L   Potassium 3.7 3.5 - 5.1 mmol/L   Chloride 102 98 - 111 mmol/L   CO2 28 22 - 32 mmol/L   Glucose, Bld 125 (H) 70 - 99 mg/dL   BUN 8 8 - 23 mg/dL   Creatinine, Ser 0.71 0.44 - 1.00 mg/dL   Calcium 8.1 (L) 8.9 - 10.3 mg/dL   GFR calc non Af Amer >60 >60 mL/min   GFR calc Af Amer >60 >60 mL/min   Anion gap 8 5 - 15    Comment: Performed at [facility], [place] [address] [place], [place] [address]    Studies/Results: Ct Entero Conni Slipper Contast  Result Date: 07/25/2019 CLINICAL DATA:  Inpatient. Melena. Fever. Leukocytosis.  Gastric ulcer on upper endoscopy. Segmental wall thickening in the ileum on recent CT. EXAM: CT ABDOMEN AND PELVIS WITH CONTRAST (ENTEROGRAPHY) TECHNIQUE: Multidetector CT of the abdomen and pelvis during bolus administration of intravenous contrast. Negative oral contrast was given. CONTRAST:  174mL OMNIPAQUE IOHEXOL 300 MG/ML  SOLN COMPARISON:  07/22/2019 CT abdomen/pelvis. FINDINGS: Lower chest: No significant pulmonary nodules or acute consolidative airspace disease. Hepatobiliary: Normal liver size. No liver mass. Cholecystectomy. No biliary ductal dilatation. Pancreas: Normal, with no mass or duct dilation. Spleen: Normal size. No mass. Adrenals/Urinary Tract: Normal adrenals. Subcentimeter hypodense renal cortical lesion in the interpolar left kidney is too small to characterize and is unchanged, requiring no follow-up. No additional renal lesions. Mild fullness of the central renal collecting systems without overt hydronephrosis. Normal bladder. Stomach/Bowel: Surgical clips adjacent to the gastric cardia, potentially from prior fundoplication. Small hiatal hernia. Otherwise normal stomach. Normal caliber small bowel. There are numerous tiny diverticula throughout mid to distal ileum. There is focal short segment wall thickening with adjacent mesenteric fat stranding within a right pelvic ileal loop (series 3/image 180) associated with a tiny ileal diverticulum, compatible with acute ileal diverticulitis. The previously visualized tiny foci of free air in the adjacent mesentery have resolved. The wall thickening and inflammatory fat stranding is otherwise similar since 07/22/2019. No discrete abscess. No additional sites of small bowel wall thickening or mucosal hyperenhancement. No small bowel mass or fistula. Normal terminal ileum. Normal appendix. Mild sigmoid diverticulosis, with no large bowel wall thickening or significant pericolonic fat stranding. No mucosal hyperenhancement in the large bowel.  Vascular/Lymphatic: Mildly atherosclerotic nonaneurysmal abdominal aorta. Patent portal,  splenic, hepatic and renal veins. No pathologically enlarged lymph nodes in the abdomen or pelvis. Reproductive: Status post hysterectomy, with no abnormal findings at the vaginal cuff. No adnexal mass. Other: No pneumoperitoneum, ascites or focal fluid collection. Musculoskeletal: No aggressive appearing focal osseous lesions. IMPRESSION: 1. Acute diverticulitis of the small bowel involving a right pelvic loop of ileum. Previously noted tiny foci of free air in the adjacent mesentery on recent 07/22/2019 CT study have resolved. No discrete abscess. Inflammatory changes otherwise not substantially changed in the interval. 2. Numerous tiny diverticula throughout the mid to distal ileum. Mild sigmoid diverticulosis. No colonic diverticulitis. 3. Small hiatal hernia 4.  Aortic Atherosclerosis (ICD10-I70.0). Electronically Signed   By: Ilona Sorrel M.D.   On: 07/25/2019 13:11    Medications: I have reviewed the patient's current medications.  Assessment: Gastric ulcers Acute terminal ileal diverticulitis  Plan: Okay to send home on oral Augmentin for total 10 days. Patient needs to take bulk forming agent such as Benefiber/Metamucil/psyllium husk twice daily on a regular basis. Follow-up with [facility] in 4 to 6 weeks.  Ronnette Juniper, MD 07/27/2019, 9:07 AM

## 2019-07-28 LAB — CULTURE, BLOOD (ROUTINE X 2)
Culture: NO GROWTH
Culture: NO GROWTH
Special Requests: ADEQUATE

## 2019-08-03 DIAGNOSIS — K5792 Diverticulitis of intestine, part unspecified, without perforation or abscess without bleeding: Secondary | ICD-10-CM | POA: Diagnosis not present

## 2019-08-03 DIAGNOSIS — I10 Essential (primary) hypertension: Secondary | ICD-10-CM | POA: Diagnosis not present

## 2019-08-03 DIAGNOSIS — K2970 Gastritis, unspecified, without bleeding: Secondary | ICD-10-CM | POA: Diagnosis not present

## 2019-08-03 DIAGNOSIS — E039 Hypothyroidism, unspecified: Secondary | ICD-10-CM | POA: Diagnosis not present

## 2019-09-12 ENCOUNTER — Other Ambulatory Visit: Payer: Self-pay

## 2019-09-12 DIAGNOSIS — Z20822 Contact with and (suspected) exposure to covid-19: Secondary | ICD-10-CM

## 2019-09-13 LAB — NOVEL CORONAVIRUS, NAA: SARS-CoV-2, NAA: NOT DETECTED

## 2019-12-16 ENCOUNTER — Other Ambulatory Visit: Payer: Self-pay

## 2019-12-16 ENCOUNTER — Encounter: Payer: Self-pay | Admitting: Family Medicine

## 2019-12-16 ENCOUNTER — Ambulatory Visit (INDEPENDENT_AMBULATORY_CARE_PROVIDER_SITE_OTHER): Payer: PPO | Admitting: Family Medicine

## 2019-12-16 DIAGNOSIS — M5442 Lumbago with sciatica, left side: Secondary | ICD-10-CM | POA: Diagnosis not present

## 2019-12-16 MED ORDER — METHYLPREDNISOLONE 4 MG PO TBPK: ORAL_TABLET | ORAL | 0 refills | Status: DC

## 2019-12-16 MED ORDER — GABAPENTIN 300 MG PO CAPS: ORAL_CAPSULE | ORAL | 1 refills | Status: DC

## 2019-12-16 MED ORDER — BACLOFEN 10 MG PO TABS: 10.0000 mg | ORAL_TABLET | Freq: Three times a day (TID) | ORAL | 3 refills | Status: DC | PRN

## 2019-12-16 NOTE — Addendum Note (Signed)
Addended by: Marlyne Beards on: 12/16/2019 10:31 AM   Modules accepted: Orders

## 2019-12-16 NOTE — Progress Notes (Signed)
Office Visit Note   Patient: [name]           Date of Birth: Dec 16, 1945           MRN: [id number] Visit Date: 12/16/2019 Requested by: Alroy Dust, L.Marlou Sa, [place] [address] [employer] [address] [place],  [place] [address] PCP: Alroy Dust, L.Marlou Sa, MD  Subjective: Chief Complaint  Patient presents with  . Lower Back - Pain    Pain x 2 weeks, after bending (at the waist) to pick up something from the floor. Pain across back - today it hurts more on the left. Tingling down posterior left leg to foot.    HPI: She is here with a flareup of low back pain.  About 2 weeks ago she tried to help lift a generator.  She felt some pain but not severe.  Then soon thereafter she bent forward to get something and felt immediate pain, could not stand up straight.  She had pain across the lower lumbar spine since then with occasional tingling in her left leg.  She has a history of back problems and has done well in the past with epidural injections.  She has not had any trouble since 2019.  She has tried Aleve and Tylenol with minimal improvement, tried Flexeril but it made her constipated.              ROS: Denies bowel or bladder dysfunction.  No fevers or chills.  All other systems were reviewed and are negative.  Objective: Vital Signs: There were no vitals taken for this visit.  Physical Exam:  General:  Alert and oriented, in no acute distress. Pulm:  Breathing unlabored. Psy:  Normal mood, congruent affect. Skin: No rash. Low back: She is tender primarily around the upper part of her sacrum and near the SI joints, left greater than right.  No pain in the sciatic notch, lower extremity strength and reflexes are normal, straight leg raise is negative.  Imaging: None today  Assessment & Plan: 1.  Flareup of low back pain, possibly recurrent disc protrusion versus sacroiliac dysfunction. -We will try to get her in for either epidural or SI joint injection. -In the past she has done fairly well with  Medrol Dosepak and gabapentin so we will refill those along with baclofen as needed. -Consider x-rays and MRI scan if she fails to improve.     Procedures: No procedures performed  No notes on file     PMFS History: Patient Active Problem List   Diagnosis Date Noted  . Dyslipidemia 07/27/2019  . Essential hypertension 07/27/2019  . Upper GI bleed 07/27/2019  . Diverticulitis 07/27/2019  . Gastritis 07/27/2019  . Melena 07/23/2019  . Concussion with loss of consciousness 06/02/2014  . Left-sided weakness 05/28/2014  . Syncope and collapse 05/28/2014  . Scalp contusion 05/28/2014  . Hypertension    Past Medical History:  Diagnosis Date  . Depression   . Hypertension   . IBS (irritable bowel syndrome)   . Malignant melanoma ([place])     Family History  Problem Relation Age of Onset  . Heart attack Mother   . Hypertension Mother   . Cancer Father   . Diabetes Father   . Hypertension Father     Past Surgical History:  Procedure Laterality Date  . BLADDER REPAIR    . ESOPHAGOGASTRODUODENOSCOPY (EGD) WITH PROPOFOL N/A 07/24/2019   Procedure: ESOPHAGOGASTRODUODENOSCOPY (EGD) WITH PROPOFOL;  Surgeon: Wilford Corner, MD;  Location: [facility];  Service: Endoscopy;  Laterality:  N/A;  . FOOT SURGERY Bilateral   . HERNIA REPAIR    . MELANOMA EXCISION    . VAGINAL HYSTERECTOMY     Social History   Occupational History  . Occupation: retired  Tobacco Use  . Smoking status: Never Smoker  . Smokeless tobacco: Never Used  Substance and Sexual Activity  . Alcohol use: No  . Drug use: No  . Sexual activity: Not on file

## 2019-12-20 ENCOUNTER — Ambulatory Visit
Admission: RE | Admit: 2019-12-20 | Discharge: 2019-12-20 | Disposition: A | Discharge status: Home / Self Care | Payer: PPO | Source: Ambulatory Visit | Attending: Family Medicine | Admitting: Family Medicine

## 2019-12-20 DIAGNOSIS — M5442 Lumbago with sciatica, left side: Secondary | ICD-10-CM

## 2019-12-20 DIAGNOSIS — M545 Low back pain: Secondary | ICD-10-CM | POA: Diagnosis not present

## 2019-12-20 MED ORDER — IOPAMIDOL (ISOVUE-M 200) INJECTION 41%
1.0000 mL | Freq: Once | INTRAMUSCULAR | Status: AC
  Administered 2019-12-20: 1 mL via EPIDURAL

## 2019-12-20 MED ORDER — METHYLPREDNISOLONE ACETATE 40 MG/ML INJ SUSP (RADIOLOG
120.0000 mg | Freq: Once | INTRAMUSCULAR | Status: AC
  Administered 2019-12-20: 120 mg via EPIDURAL

## 2019-12-20 NOTE — Discharge Instructions (Signed)

## 2020-01-10 DIAGNOSIS — Z961 Presence of intraocular lens: Secondary | ICD-10-CM | POA: Diagnosis not present

## 2020-01-10 DIAGNOSIS — H5703 Miosis: Secondary | ICD-10-CM | POA: Diagnosis not present

## 2020-01-10 DIAGNOSIS — H2589 Other age-related cataract: Secondary | ICD-10-CM | POA: Diagnosis not present

## 2020-01-10 DIAGNOSIS — H40013 Open angle with borderline findings, low risk, bilateral: Secondary | ICD-10-CM | POA: Diagnosis not present

## 2020-01-10 DIAGNOSIS — H04123 Dry eye syndrome of bilateral lacrimal glands: Secondary | ICD-10-CM | POA: Diagnosis not present

## 2020-01-10 DIAGNOSIS — Z9889 Other specified postprocedural states: Secondary | ICD-10-CM | POA: Diagnosis not present

## 2020-02-21 DIAGNOSIS — J45909 Unspecified asthma, uncomplicated: Secondary | ICD-10-CM | POA: Diagnosis not present

## 2020-02-21 DIAGNOSIS — R159 Full incontinence of feces: Secondary | ICD-10-CM | POA: Diagnosis not present

## 2020-02-21 DIAGNOSIS — Z0000 Encounter for general adult medical examination without abnormal findings: Secondary | ICD-10-CM | POA: Diagnosis not present

## 2020-02-21 DIAGNOSIS — G4700 Insomnia, unspecified: Secondary | ICD-10-CM | POA: Diagnosis not present

## 2020-02-21 DIAGNOSIS — L309 Dermatitis, unspecified: Secondary | ICD-10-CM | POA: Diagnosis not present

## 2020-02-21 DIAGNOSIS — I10 Essential (primary) hypertension: Secondary | ICD-10-CM | POA: Diagnosis not present

## 2020-02-21 DIAGNOSIS — F411 Generalized anxiety disorder: Secondary | ICD-10-CM | POA: Diagnosis not present

## 2020-02-21 DIAGNOSIS — E039 Hypothyroidism, unspecified: Secondary | ICD-10-CM | POA: Diagnosis not present

## 2020-02-21 DIAGNOSIS — N951 Menopausal and female climacteric states: Secondary | ICD-10-CM | POA: Diagnosis not present

## 2020-02-21 DIAGNOSIS — E782 Mixed hyperlipidemia: Secondary | ICD-10-CM | POA: Diagnosis not present

## 2020-03-07 ENCOUNTER — Other Ambulatory Visit: Payer: Self-pay | Admitting: Family Medicine

## 2020-03-07 DIAGNOSIS — Z1231 Encounter for screening mammogram for malignant neoplasm of breast: Secondary | ICD-10-CM

## 2020-03-09 ENCOUNTER — Other Ambulatory Visit: Payer: Self-pay | Admitting: Pharmacy Technician

## 2020-03-09 NOTE — Patient Outreach (Signed)
[facility]) Care Management  03/09/2020  [name] 19-Dec-1945 [id number]     Received Incoming phone call from [place] at [place] office requesting assistance in regards to a patient that [place] assistance last year for patient assistance for Proventil HFA with [employer] and Premarin Vaginal cream with [place] that [facility] was no longer contracted with [facility] to provide patient assistance.   Informed Myriam Jacobson that I could enter a referral in to the [facility] HAX system on behalf of herself and the patient. Also advised her to have patient call the [facility] concierge number on the back of her insurance card. The third option was for [facility] to assist patient herself.   Myriam Jacobson requested that I put in the referral into the [place].   Referral was placed today. The ticket number is Tracking ID: [id number]   [place] [place], [place]  [phone]

## 2020-04-05 DIAGNOSIS — L821 Other seborrheic keratosis: Secondary | ICD-10-CM | POA: Diagnosis not present

## 2020-04-05 DIAGNOSIS — L905 Scar conditions and fibrosis of skin: Secondary | ICD-10-CM | POA: Diagnosis not present

## 2020-04-05 DIAGNOSIS — Z85820 Personal history of malignant melanoma of skin: Secondary | ICD-10-CM | POA: Diagnosis not present

## 2020-04-05 DIAGNOSIS — L570 Actinic keratosis: Secondary | ICD-10-CM | POA: Diagnosis not present

## 2020-04-05 DIAGNOSIS — L814 Other melanin hyperpigmentation: Secondary | ICD-10-CM | POA: Diagnosis not present

## 2020-04-05 DIAGNOSIS — L578 Other skin changes due to chronic exposure to nonionizing radiation: Secondary | ICD-10-CM | POA: Diagnosis not present

## 2020-04-05 DIAGNOSIS — L820 Inflamed seborrheic keratosis: Secondary | ICD-10-CM | POA: Diagnosis not present

## 2020-04-05 DIAGNOSIS — D1801 Hemangioma of skin and subcutaneous tissue: Secondary | ICD-10-CM | POA: Diagnosis not present

## 2020-04-05 DIAGNOSIS — L718 Other rosacea: Secondary | ICD-10-CM | POA: Diagnosis not present

## 2020-04-05 DIAGNOSIS — D225 Melanocytic nevi of trunk: Secondary | ICD-10-CM | POA: Diagnosis not present

## 2020-04-20 ENCOUNTER — Other Ambulatory Visit: Payer: Self-pay

## 2020-04-20 ENCOUNTER — Ambulatory Visit
Admission: RE | Admit: 2020-04-20 | Discharge: 2020-04-20 | Disposition: A | Discharge status: Home / Self Care | Payer: PPO | Source: Ambulatory Visit | Attending: Family Medicine | Admitting: Family Medicine

## 2020-04-20 DIAGNOSIS — Z1231 Encounter for screening mammogram for malignant neoplasm of breast: Secondary | ICD-10-CM

## 2020-06-13 DIAGNOSIS — H43812 Vitreous degeneration, left eye: Secondary | ICD-10-CM | POA: Diagnosis not present

## 2020-06-13 DIAGNOSIS — H4312 Vitreous hemorrhage, left eye: Secondary | ICD-10-CM | POA: Diagnosis not present

## 2020-06-20 DIAGNOSIS — H43812 Vitreous degeneration, left eye: Secondary | ICD-10-CM | POA: Diagnosis not present

## 2020-06-20 DIAGNOSIS — H4312 Vitreous hemorrhage, left eye: Secondary | ICD-10-CM | POA: Diagnosis not present

## 2020-06-22 ENCOUNTER — Other Ambulatory Visit: Payer: Self-pay

## 2020-06-22 ENCOUNTER — Encounter (INDEPENDENT_AMBULATORY_CARE_PROVIDER_SITE_OTHER): Payer: PPO | Admitting: Ophthalmology

## 2020-06-22 DIAGNOSIS — H35033 Hypertensive retinopathy, bilateral: Secondary | ICD-10-CM | POA: Diagnosis not present

## 2020-06-22 DIAGNOSIS — I10 Essential (primary) hypertension: Secondary | ICD-10-CM

## 2020-06-22 DIAGNOSIS — H43813 Vitreous degeneration, bilateral: Secondary | ICD-10-CM | POA: Diagnosis not present

## 2020-06-22 DIAGNOSIS — H33302 Unspecified retinal break, left eye: Secondary | ICD-10-CM | POA: Diagnosis not present

## 2020-07-02 ENCOUNTER — Encounter (INDEPENDENT_AMBULATORY_CARE_PROVIDER_SITE_OTHER): Payer: PPO | Admitting: Ophthalmology

## 2020-07-02 ENCOUNTER — Other Ambulatory Visit: Payer: Self-pay

## 2020-07-02 DIAGNOSIS — H33302 Unspecified retinal break, left eye: Secondary | ICD-10-CM

## 2020-08-11 ENCOUNTER — Ambulatory Visit: Payer: PPO | Attending: Internal Medicine

## 2020-08-11 DIAGNOSIS — Z23 Encounter for immunization: Secondary | ICD-10-CM

## 2020-08-11 NOTE — Progress Notes (Signed)
   Covid-19 Vaccination Clinic  Name:  [name]    MRN: [id number] DOB: [birth year]  08/11/2020  [name] was observed post Covid-19 immunization for 15 minutes without incident. She was provided with Vaccine Information Sheet and instruction to access the V-Safe system.   [name] was instructed to call 911 with any severe reactions post vaccine: Marland Kitchen Difficulty breathing  . Swelling of face and throat  . A fast heartbeat  . A bad rash all over body  . Dizziness and weakness

## 2020-08-28 DIAGNOSIS — E039 Hypothyroidism, unspecified: Secondary | ICD-10-CM | POA: Diagnosis not present

## 2020-08-28 DIAGNOSIS — E782 Mixed hyperlipidemia: Secondary | ICD-10-CM | POA: Diagnosis not present

## 2020-08-28 DIAGNOSIS — F411 Generalized anxiety disorder: Secondary | ICD-10-CM | POA: Diagnosis not present

## 2020-08-28 DIAGNOSIS — I10 Essential (primary) hypertension: Secondary | ICD-10-CM | POA: Diagnosis not present

## 2020-08-28 DIAGNOSIS — J45909 Unspecified asthma, uncomplicated: Secondary | ICD-10-CM | POA: Diagnosis not present

## 2020-08-28 DIAGNOSIS — I700 Atherosclerosis of aorta: Secondary | ICD-10-CM | POA: Diagnosis not present

## 2020-08-28 DIAGNOSIS — N760 Acute vaginitis: Secondary | ICD-10-CM | POA: Diagnosis not present

## 2020-11-02 ENCOUNTER — Encounter (INDEPENDENT_AMBULATORY_CARE_PROVIDER_SITE_OTHER): Payer: PPO | Admitting: Ophthalmology

## 2020-11-13 ENCOUNTER — Encounter (INDEPENDENT_AMBULATORY_CARE_PROVIDER_SITE_OTHER): Payer: PPO | Admitting: Ophthalmology

## 2020-11-22 ENCOUNTER — Other Ambulatory Visit (HOSPITAL_COMMUNITY): Payer: Self-pay | Admitting: Family Medicine

## 2020-11-22 ENCOUNTER — Other Ambulatory Visit: Payer: Self-pay | Admitting: Family Medicine

## 2020-11-22 ENCOUNTER — Ambulatory Visit
Admission: RE | Admit: 2020-11-22 | Discharge: 2020-11-22 | Disposition: A | Discharge status: Home / Self Care | Payer: PPO | Source: Ambulatory Visit | Attending: Family Medicine | Admitting: Family Medicine

## 2020-11-22 DIAGNOSIS — J069 Acute upper respiratory infection, unspecified: Secondary | ICD-10-CM

## 2020-11-22 DIAGNOSIS — R059 Cough, unspecified: Secondary | ICD-10-CM | POA: Diagnosis not present

## 2020-11-22 MED FILL — predniSONE 20 MG TABS: 20 | 6 days supply | Qty: 12 | Fill #0

## 2020-11-22 MED FILL — PROMETHAZINE W/COD SYRUP: 6.25-10 | 4 days supply | Qty: 120 | Fill #0

## 2021-01-03 ENCOUNTER — Telehealth: Payer: Self-pay | Admitting: Orthopedic Surgery

## 2021-01-03 NOTE — Telephone Encounter (Signed)
Tried calling patient from [facility] office. No answer. Patient will need follow up appointment before procedure can be ordered.

## 2021-01-03 NOTE — Telephone Encounter (Signed)
Patient called requesting a referral for a injection in her lower back. Patient states it was Scientist, physiological who sent her to imaging for her last injection. I explained that she seen Dr. Marlou Sa in 2019 and Dr. Junius Roads in 2021. She is unsure of who Dr. Junius Roads is. I explained that she may possibly need to make an appt with Dr. Marlou Sa since it has been since 2019. Patient is asking for a call back from Dr. Marlou Sa nurse Terrence Dupont. Please call patient at [phone].

## 2021-01-09 ENCOUNTER — Ambulatory Visit: Payer: Self-pay

## 2021-01-09 ENCOUNTER — Ambulatory Visit: Payer: PPO | Admitting: Orthopedic Surgery

## 2021-01-09 DIAGNOSIS — M79605 Pain in left leg: Secondary | ICD-10-CM | POA: Diagnosis not present

## 2021-01-09 DIAGNOSIS — M79604 Pain in right leg: Secondary | ICD-10-CM | POA: Diagnosis not present

## 2021-01-09 MED ORDER — CYCLOBENZAPRINE HCL 10 MG PO TABS: 10.0000 mg | ORAL_TABLET | Freq: Two times a day (BID) | ORAL | 0 refills | Status: DC | PRN

## 2021-01-11 ENCOUNTER — Ambulatory Visit
Admission: RE | Admit: 2021-01-11 | Discharge: 2021-01-11 | Disposition: A | Discharge status: Home / Self Care | Payer: PPO | Source: Ambulatory Visit | Attending: Orthopedic Surgery | Admitting: Orthopedic Surgery

## 2021-01-11 ENCOUNTER — Other Ambulatory Visit: Payer: Self-pay

## 2021-01-11 DIAGNOSIS — M79604 Pain in right leg: Secondary | ICD-10-CM

## 2021-01-11 DIAGNOSIS — M47817 Spondylosis without myelopathy or radiculopathy, lumbosacral region: Secondary | ICD-10-CM | POA: Diagnosis not present

## 2021-01-11 MED ORDER — METHYLPREDNISOLONE ACETATE 40 MG/ML INJ SUSP (RADIOLOG
120.0000 mg | Freq: Once | INTRAMUSCULAR | Status: AC
  Administered 2021-01-11: 120 mg via EPIDURAL

## 2021-01-11 MED ORDER — IOPAMIDOL (ISOVUE-M 200) INJECTION 41%
1.0000 mL | Freq: Once | INTRAMUSCULAR | Status: AC
  Administered 2021-01-11: 1 mL via EPIDURAL

## 2021-01-11 NOTE — Discharge Instructions (Signed)

## 2021-01-15 ENCOUNTER — Encounter: Payer: Self-pay | Admitting: Orthopedic Surgery

## 2021-01-15 NOTE — Progress Notes (Signed)
Office Visit Note   Patient: [name]           Date of Birth: [birth year]           MRN: [id number] Visit Date: 01/09/2021 Requested by: Alroy Dust, L.Marlou Sa, [place] [address] [place],  [place] [address] PCP: Alroy Dust, L.Marlou Sa, MD  Subjective: Chief Complaint  Patient presents with  . Lower Back - Pain  . Left Knee - Pain  . Right Knee - Pain    HPI: [name] is a 75 year old patient with low back pain and bilateral knee pain.  Her knees hurt to go up and down stairs.  She also reports knots in her back with pain radiating from the back down to both knees.  Denies any constant numbness and tingling but does report constant pain which wakes her from sleep at night.  Her husband had to have foot surgery and she has had to do everything which has exacerbated her back symptoms.  Right side is worse than the left.  She has tried CBD oil and other medication.  Flexeril does help.  Going from sitting to standing is painful mostly in her back.  She does like to walk for exercise.  She had good results from [facility] imaging injections in her back in the past.              ROS: All systems reviewed are negative as they relate to the chief complaint within the history of present illness.  Patient denies  fevers or chills.   Assessment & Plan: Visit Diagnoses:  1. Bilateral leg pain     Plan: Impression is low back pain with good results from injections in the past at [facility].  Plan is Flexeril prescription along with referral for L spine ESI.  Would not really think surgery is indicated at this time unless her symptoms worsen significantly.  Would try at least 2 injections before considering surgical referral.  Follow-up as needed.  I do want her to try to do some stretching and continue to walk after she gets the pain under control with the injections.  Follow-Up Instructions: Return if symptoms worsen or fail to improve.   Orders:  Orders Placed This Encounter   Procedures  . Epidural Steroid Injection - Lumbar/Sacral (Ancillary Performed)   Meds ordered this encounter  Medications  . cyclobenzaprine (FLEXERIL) 10 MG tablet    Sig: Take 1 tablet (10 mg total) by mouth 2 (two) times daily as needed for muscle spasms.    Dispense:  30 tablet    Refill:  0      Procedures: No procedures performed   Clinical Data: No additional findings.  Objective: Vital Signs: There were no vitals taken for this visit.  Physical Exam:   Constitutional: Patient appears well-developed HEENT:  Head: Normocephalic Eyes:EOM are normal Neck: Normal range of motion Cardiovascular: Normal rate Pulmonary/chest: Effort normal Neurologic: Patient is alert Skin: Skin is warm Psychiatric: Patient has normal mood and affect    Ortho Exam: Ortho exam demonstrates full active and passive range of motion of the ankles and hips.  Knees also have good range of motion.  Good ankle dorsiflexion strength.  She does have more nerve root tension signs on the right than the left.  Equivocal paresthesias L5 distribution right versus left.  Negative Babinski negative clonus.  Does have some pain with forward lateral bending.  Specialty Comments:  No specialty comments available.  Imaging: No results found.  PMFS History: Patient Active Problem List   Diagnosis Date Noted  . Dyslipidemia 07/27/2019  . Essential hypertension 07/27/2019  . Upper GI bleed 07/27/2019  . Diverticulitis 07/27/2019  . Gastritis 07/27/2019  . Melena 07/23/2019  . Concussion with loss of consciousness 06/02/2014  . Left-sided weakness 05/28/2014  . Syncope and collapse 05/28/2014  . Scalp contusion 05/28/2014  . Hypertension    Past Medical History:  Diagnosis Date  . Depression   . Hypertension   . IBS (irritable bowel syndrome)   . Malignant melanoma ([place])     Family History  Problem Relation Age of Onset  . Heart attack Mother   . Hypertension Mother   . Cancer Father    . Diabetes Father   . Hypertension Father     Past Surgical History:  Procedure Laterality Date  . BLADDER REPAIR    . ESOPHAGOGASTRODUODENOSCOPY (EGD) WITH PROPOFOL N/A 07/24/2019   Procedure: ESOPHAGOGASTRODUODENOSCOPY (EGD) WITH PROPOFOL;  Surgeon: Wilford Corner, MD;  [facility];  Service: Endoscopy;  Laterality: N/A;  . FOOT SURGERY Bilateral   . HERNIA REPAIR    . MELANOMA EXCISION    . VAGINAL HYSTERECTOMY     Social History   Occupational History  . Occupation: retired  Tobacco Use  . Smoking status: Never Smoker  . Smokeless tobacco: Never Used  Vaping Use  . Vaping Use: Never used  Substance and Sexual Activity  . Alcohol use: No  . Drug use: No  . Sexual activity: Not on file

## 2021-01-23 ENCOUNTER — Encounter (INDEPENDENT_AMBULATORY_CARE_PROVIDER_SITE_OTHER): Payer: PPO | Admitting: Ophthalmology

## 2021-01-23 ENCOUNTER — Other Ambulatory Visit: Payer: Self-pay

## 2021-01-23 DIAGNOSIS — I10 Essential (primary) hypertension: Secondary | ICD-10-CM

## 2021-01-23 DIAGNOSIS — H33302 Unspecified retinal break, left eye: Secondary | ICD-10-CM | POA: Diagnosis not present

## 2021-01-23 DIAGNOSIS — H35033 Hypertensive retinopathy, bilateral: Secondary | ICD-10-CM | POA: Diagnosis not present

## 2021-01-23 DIAGNOSIS — H43813 Vitreous degeneration, bilateral: Secondary | ICD-10-CM | POA: Diagnosis not present

## 2021-02-04 ENCOUNTER — Other Ambulatory Visit: Payer: Self-pay | Admitting: Family Medicine

## 2021-02-04 DIAGNOSIS — N6320 Unspecified lump in the left breast, unspecified quadrant: Secondary | ICD-10-CM

## 2021-02-04 DIAGNOSIS — R5381 Other malaise: Secondary | ICD-10-CM

## 2021-02-05 ENCOUNTER — Ambulatory Visit
Admission: RE | Admit: 2021-02-05 | Discharge: 2021-02-05 | Disposition: A | Discharge status: Home / Self Care | Payer: PPO | Source: Ambulatory Visit | Attending: Family Medicine | Admitting: Family Medicine

## 2021-02-05 ENCOUNTER — Other Ambulatory Visit: Payer: Self-pay

## 2021-02-05 DIAGNOSIS — N6320 Unspecified lump in the left breast, unspecified quadrant: Secondary | ICD-10-CM

## 2021-02-05 DIAGNOSIS — N644 Mastodynia: Secondary | ICD-10-CM | POA: Diagnosis not present

## 2021-02-05 DIAGNOSIS — R922 Inconclusive mammogram: Secondary | ICD-10-CM | POA: Diagnosis not present

## 2021-02-26 ENCOUNTER — Other Ambulatory Visit: Payer: Self-pay | Admitting: Family Medicine

## 2021-02-26 DIAGNOSIS — F411 Generalized anxiety disorder: Secondary | ICD-10-CM | POA: Diagnosis not present

## 2021-02-26 DIAGNOSIS — Z1231 Encounter for screening mammogram for malignant neoplasm of breast: Secondary | ICD-10-CM

## 2021-02-26 DIAGNOSIS — E2839 Other primary ovarian failure: Secondary | ICD-10-CM

## 2021-02-26 DIAGNOSIS — R159 Full incontinence of feces: Secondary | ICD-10-CM | POA: Diagnosis not present

## 2021-02-26 DIAGNOSIS — G4700 Insomnia, unspecified: Secondary | ICD-10-CM | POA: Diagnosis not present

## 2021-02-26 DIAGNOSIS — N951 Menopausal and female climacteric states: Secondary | ICD-10-CM | POA: Diagnosis not present

## 2021-02-26 DIAGNOSIS — E782 Mixed hyperlipidemia: Secondary | ICD-10-CM | POA: Diagnosis not present

## 2021-02-26 DIAGNOSIS — I10 Essential (primary) hypertension: Secondary | ICD-10-CM | POA: Diagnosis not present

## 2021-02-26 DIAGNOSIS — Z0000 Encounter for general adult medical examination without abnormal findings: Secondary | ICD-10-CM | POA: Diagnosis not present

## 2021-02-26 DIAGNOSIS — E039 Hypothyroidism, unspecified: Secondary | ICD-10-CM | POA: Diagnosis not present

## 2021-03-08 ENCOUNTER — Other Ambulatory Visit: Payer: PPO

## 2021-03-27 ENCOUNTER — Telehealth: Payer: Self-pay | Admitting: Orthopedic Surgery

## 2021-03-27 DIAGNOSIS — M5442 Lumbago with sciatica, left side: Secondary | ICD-10-CM

## 2021-03-27 NOTE — Telephone Encounter (Signed)
Pt called stating Dr. Marlou Sa previously referred her to [facility] to get an inj. in her back. Pt would like another inj. but she would like Dr. Ernestina Patches to do it. Pt would like a CB if this will be possible.   [phone]

## 2021-03-27 NOTE — Telephone Encounter (Signed)
Thx

## 2021-03-27 NOTE — Telephone Encounter (Signed)
I put referral in for Dr Ernestina Patches

## 2021-04-05 DIAGNOSIS — D485 Neoplasm of uncertain behavior of skin: Secondary | ICD-10-CM | POA: Diagnosis not present

## 2021-04-05 DIAGNOSIS — L905 Scar conditions and fibrosis of skin: Secondary | ICD-10-CM | POA: Diagnosis not present

## 2021-04-05 DIAGNOSIS — B079 Viral wart, unspecified: Secondary | ICD-10-CM | POA: Diagnosis not present

## 2021-04-05 DIAGNOSIS — L814 Other melanin hyperpigmentation: Secondary | ICD-10-CM | POA: Diagnosis not present

## 2021-04-05 DIAGNOSIS — L820 Inflamed seborrheic keratosis: Secondary | ICD-10-CM | POA: Diagnosis not present

## 2021-04-05 DIAGNOSIS — D225 Melanocytic nevi of trunk: Secondary | ICD-10-CM | POA: Diagnosis not present

## 2021-04-05 DIAGNOSIS — L570 Actinic keratosis: Secondary | ICD-10-CM | POA: Diagnosis not present

## 2021-04-05 DIAGNOSIS — Z85820 Personal history of malignant melanoma of skin: Secondary | ICD-10-CM | POA: Diagnosis not present

## 2021-04-09 ENCOUNTER — Encounter: Payer: Self-pay | Admitting: Physical Medicine and Rehabilitation

## 2021-04-09 ENCOUNTER — Other Ambulatory Visit: Payer: Self-pay

## 2021-04-09 ENCOUNTER — Ambulatory Visit: Payer: PPO | Admitting: Physical Medicine and Rehabilitation

## 2021-04-09 ENCOUNTER — Ambulatory Visit: Payer: Self-pay

## 2021-04-09 VITALS — BP 131/82 | HR 49

## 2021-04-09 DIAGNOSIS — M5416 Radiculopathy, lumbar region: Secondary | ICD-10-CM | POA: Diagnosis not present

## 2021-04-09 MED ORDER — BETAMETHASONE SOD PHOS & ACET 6 (3-3) MG/ML IJ SUSP
12.0000 mg | Freq: Once | INTRAMUSCULAR | Status: AC
  Administered 2021-04-09: 12 mg

## 2021-04-09 NOTE — Progress Notes (Signed)
Pt state lower back pain that travels to her right hip down her right leg. Pt state sitting and lifting makes the pain worse. Pt state she gets massage and uses heating and ice to help ease her pain.  Numeric Pain Rating Scale and Functional Assessment Average Pain 6   In the last MONTH (on 0-10 scale) has pain interfered with the following?  1. General activity like being  able to carry out your everyday physical activities such as walking, climbing stairs, carrying groceries, or moving a chair?  Rating(10)   +Driver, -BT, -Dye Allergies.

## 2021-04-09 NOTE — Patient Instructions (Signed)

## 2021-04-10 NOTE — Procedures (Signed)
Lumbar Epidural Steroid Injection - Interlaminar Approach with Fluoroscopic Guidance  Patient: [name]      Date of Birth: [birth year] MRN: [id number] PCP: Alroy Dust, L.Marlou Sa, MD      Visit Date: 04/09/2021   Universal Protocol:     Consent Given By: the patient  Position: PRONE  Additional Comments: Vital signs were monitored before and after the procedure. Patient was prepped and draped in the usual sterile fashion. The correct patient, procedure, and site was verified.   Injection Procedure Details:   Procedure diagnoses: Lumbar radiculopathy [M54.16]   Meds Administered:  Meds ordered this encounter  Medications   betamethasone acetate-betamethasone sodium phosphate (CELESTONE) injection 12 mg     Laterality: Right  Location/Site:  L5-S1  Needle: 3.5 in., 20 ga. Tuohy  Needle Placement: Paramedian epidural  Findings:   -Comments: Excellent flow of contrast into the epidural space.  Procedure Details: Using a paramedian approach from the side mentioned above, the region overlying the inferior lamina was localized under fluoroscopic visualization and the soft tissues overlying this structure were infiltrated with 4 ml. of 1% Lidocaine without Epinephrine. The Tuohy needle was inserted into the epidural space using a paramedian approach.   The epidural space was localized using loss of resistance along with counter oblique bi-planar fluoroscopic views.  After negative aspirate for air, blood, and CSF, a 2 ml. volume of Isovue-250 was injected into the epidural space and the flow of contrast was observed. Radiographs were obtained for documentation purposes.    The injectate was administered into the level noted above.   Additional Comments:  The patient tolerated the procedure well Dressing: 2 x 2 sterile gauze and Band-Aid    Post-procedure details: Patient was observed during the procedure. Post-procedure instructions were reviewed.  Patient left the  clinic in stable condition.

## 2021-04-10 NOTE — Progress Notes (Signed)
[name] - 75 y.o. female MRN [id number]  Date of birth: 02/21/1946  Office Visit Note: Visit Date: 04/09/2021 PCP: Alroy Dust, L.Marlou Sa, MD Referred by: Alroy Dust, L.Marlou Sa, MD  Subjective: Chief Complaint  Patient presents with   Lower Back - Pain   Right Hip - Pain   Right Leg - Pain   HPI:  [name] is a 75 y.o. female who comes in today at the request of Dr. Anderson Malta for planned Right L5-S1 Lumbar Interlaminar epidural steroid injection with fluoroscopic guidance.  The patient has failed conservative care including home exercise, medications, time and activity modification.  This injection will be diagnostic and hopefully therapeutic.  Please see requesting physician notes for further details and justification.  Last time I saw the patient was several years ago and it was more left hip and leg pain at that time.  The injection we performed seem to last quite a while for her and since that time she has had 2 more recent injections at [facility] imaging.  Both are right L5-S1 interlaminar injections.  Both look well-placed on the imaging.  She reports morning to return to see me because my injection evidently lasted longer for her.  I did try to point out some of the logic flaws that unfortunately over that time her spine could have changed and there could be something different going on as to why the injection was not lasting as long.  I do recommend repeat injection at this point she does seem to have right more than left radicular leg pain.  Depending on relief she needs updated MRI of the lumbar spine.  No red flag symptoms or complaints or signs.   ROS Otherwise per HPI.  Assessment & Plan: Visit Diagnoses:    ICD-10-CM   1. Lumbar radiculopathy  M54.16 XR C-ARM NO REPORT    Epidural Steroid injection    betamethasone acetate-betamethasone sodium phosphate (CELESTONE) injection 12 mg      Plan: No additional findings.   Meds & Orders:  Meds ordered this encounter   Medications   betamethasone acetate-betamethasone sodium phosphate (CELESTONE) injection 12 mg    Orders Placed This Encounter  Procedures   XR C-ARM NO REPORT   Epidural Steroid injection    Follow-up: Return if symptoms worsen or fail to improve.   Procedures: No procedures performed  Lumbar Epidural Steroid Injection - Interlaminar Approach with Fluoroscopic Guidance  Patient: [name]      Date of Birth: 17-Nov-1945 MRN: [id number] PCP: Alroy Dust, L.Marlou Sa, MD      Visit Date: 04/09/2021   Universal Protocol:     Consent Given By: the patient  Position: PRONE  Additional Comments: Vital signs were monitored before and after the procedure. Patient was prepped and draped in the usual sterile fashion. The correct patient, procedure, and site was verified.   Injection Procedure Details:   Procedure diagnoses: Lumbar radiculopathy [M54.16]   Meds Administered:  Meds ordered this encounter  Medications   betamethasone acetate-betamethasone sodium phosphate (CELESTONE) injection 12 mg     Laterality: Right  Location/Site:  L5-S1  Needle: 3.5 in., 20 ga. Tuohy  Needle Placement: Paramedian epidural  Findings:   -Comments: Excellent flow of contrast into the epidural space.  Procedure Details: Using a paramedian approach from the side mentioned above, the region overlying the inferior lamina was localized under fluoroscopic visualization and the soft tissues overlying this structure were infiltrated with 4 ml. of 1% Lidocaine without Epinephrine. The Tuohy  needle was inserted into the epidural space using a paramedian approach.   The epidural space was localized using loss of resistance along with counter oblique bi-planar fluoroscopic views.  After negative aspirate for air, blood, and CSF, a 2 ml. volume of Isovue-250 was injected into the epidural space and the flow of contrast was observed. Radiographs were obtained for documentation purposes.    The  injectate was administered into the level noted above.   Additional Comments:  The patient tolerated the procedure well Dressing: 2 x 2 sterile gauze and Band-Aid    Post-procedure details: Patient was observed during the procedure. Post-procedure instructions were reviewed.  Patient left the clinic in stable condition.   Clinical History: Lumbar spine x-ray 3/19 AP lateral lumbar spine reviewed.  Slight narrowing at L4-5 is noted.  No  spondylolisthesis or compression fractures present.  No significant facet  joint arthritis present.     Objective:  VS:  HT:    WT:   BMI:     BP:131/82  HR:(!) 49bpm  TEMP: ( )  RESP:  Physical Exam Vitals and nursing note reviewed.  Constitutional:      General: She is not in acute distress.    Appearance: Normal appearance. She is obese. She is not ill-appearing.  HENT:     Head: Normocephalic and atraumatic.     Right Ear: External ear normal.     Left Ear: External ear normal.  Eyes:     Extraocular Movements: Extraocular movements intact.  Cardiovascular:     Rate and Rhythm: Normal rate.     Pulses: Normal pulses.  Pulmonary:     Effort: Pulmonary effort is normal. No respiratory distress.  Abdominal:     General: There is no distension.     Palpations: Abdomen is soft.  Musculoskeletal:        General: Tenderness present.     Cervical back: Neck supple.     Right lower leg: No edema.     Left lower leg: No edema.     Comments: Patient has good distal strength with no pain over the greater trochanters.  No clonus or focal weakness.  Skin:    Findings: No erythema, lesion or rash.  Neurological:     General: No focal deficit present.     Mental Status: She is alert and oriented to person, place, and time.     Sensory: No sensory deficit.     Motor: No weakness or abnormal muscle tone.     Coordination: Coordination normal.  Psychiatric:        Mood and Affect: Mood normal.        Behavior: Behavior normal.      Imaging: XR C-ARM NO REPORT  Result Date: 04/09/2021 Please see Notes tab for imaging impression.

## 2021-06-06 ENCOUNTER — Ambulatory Visit
Admission: RE | Admit: 2021-06-06 | Discharge: 2021-06-06 | Disposition: A | Discharge status: Home / Self Care | Payer: PPO | Source: Ambulatory Visit | Attending: Family Medicine | Admitting: Family Medicine

## 2021-06-06 ENCOUNTER — Other Ambulatory Visit: Payer: Self-pay

## 2021-06-06 DIAGNOSIS — Z1231 Encounter for screening mammogram for malignant neoplasm of breast: Secondary | ICD-10-CM | POA: Diagnosis not present

## 2021-06-06 DIAGNOSIS — Z780 Asymptomatic menopausal state: Secondary | ICD-10-CM | POA: Diagnosis not present

## 2021-06-06 DIAGNOSIS — E2839 Other primary ovarian failure: Secondary | ICD-10-CM

## 2021-06-13 ENCOUNTER — Telehealth: Payer: Self-pay | Admitting: Orthopedic Surgery

## 2021-06-13 NOTE — Telephone Encounter (Signed)
Pt's husband called to make an appt for his wife. She is having extreme hip pain on the left side. Dr. Marlou Sa is booked out with next opening being on the 14th. When told husband this he said that Dr. Marlou Sa told him that "if anything happened to speak with the nurse and the nurse would be able to work them in sooner if an appt is too far out." The best call back number is [phone].

## 2021-06-14 NOTE — Telephone Encounter (Signed)
Pt has appt 09/07

## 2021-06-19 ENCOUNTER — Ambulatory Visit (INDEPENDENT_AMBULATORY_CARE_PROVIDER_SITE_OTHER): Payer: PPO

## 2021-06-19 ENCOUNTER — Ambulatory Visit: Payer: PPO | Admitting: Orthopedic Surgery

## 2021-06-19 ENCOUNTER — Other Ambulatory Visit: Payer: Self-pay

## 2021-06-19 ENCOUNTER — Encounter: Payer: Self-pay | Admitting: Orthopedic Surgery

## 2021-06-19 DIAGNOSIS — M545 Low back pain, unspecified: Secondary | ICD-10-CM

## 2021-06-19 DIAGNOSIS — M25552 Pain in left hip: Secondary | ICD-10-CM

## 2021-06-19 MED ORDER — TRAMADOL HCL 50 MG PO TABS: 50.0000 mg | ORAL_TABLET | Freq: Two times a day (BID) | ORAL | 0 refills | Status: DC | PRN

## 2021-06-19 NOTE — Progress Notes (Signed)
Office Visit Note   Patient: [name]           Date of Birth: 07-Apr-1946           MRN: [id number] Visit Date: 06/19/2021 Requested by: Alroy Dust, L.Marlou Sa, [place] [employer] [place],  [place] [address] PCP: Alroy Dust, L.Marlou Sa, MD  Subjective: Chief Complaint  Patient presents with   Left Hip - Follow-up    HPI: [name] is a 75 y.o. female who presents to the office complaining of left hip pain.  Pain began in the spring 2022.  She localizes pain to the posterior left buttock with some radiation into the posterior knee.  This pain keeps her from walking where she used to walk 5 to 6 miles.  Denies any numbness or tingling or groin pain.  She has occasional lateral sided hip pain.  Rarely does she have difficulty sleeping at night.  Pain waxes and wanes.  She takes over-the-counter Tylenol and Aleve for pain control.  She does report a history of "back problems".  She has received ESI's by Dr. Ernestina Patches with most recent injection in late June 2022 that provided some relief but has quickly worn off.  She was informed by Dr. Ernestina Patches that she would need another MRI prior to any further ESI's due to her last MRI scan being several years ago..                ROS: All systems reviewed are negative as they relate to the chief complaint within the history of present illness.  Patient denies fevers or chills.  Assessment & Plan: Visit Diagnoses:  1. Pain in left hip   2. Low back pain, unspecified back pain laterality, unspecified chronicity, unspecified whether sciatica present     Plan: Patient is a 75 year old female who presents complaining of left hip pain.  She has radicular pain from the posterior left buttock that travels down to her posterior left knee.  She has tried home exercise program without relief.  She has had significant reduction in her ability to go for long walks where she used to walk 5 to 6 miles and now she really does not enjoy walking at all due to the  pain it causes.  She has history of lumbar spine ESI's with Dr. Ernestina Patches with the last one in late June that did provide some relief but did not last.  Last MRI scan is out of date so plan to order MRI lumbar spine for further evaluation of any potential new pathology.  Follow-up after MRI to review results.  Follow-Up Instructions: No follow-ups on file.   Orders:  Orders Placed This Encounter  Procedures   XR HIP UNILAT W OR W/O PELVIS 2-3 VIEWS LEFT   XR Lumbar Spine 2-3 Views   MR Lumbar Spine w/o contrast   Meds ordered this encounter  Medications   traMADol (ULTRAM) 50 MG tablet    Sig: Take 1 tablet (50 mg total) by mouth every 12 (twelve) hours as needed.    Dispense:  30 tablet    Refill:  0      Procedures: No procedures performed   Clinical Data: No additional findings.  Objective: Vital Signs: There were no vitals taken for this visit.  Physical Exam:  Constitutional: Patient appears well-developed HEENT:  Head: Normocephalic Eyes:EOM are normal Neck: Normal range of motion Cardiovascular: Normal rate Pulmonary/chest: Effort normal Neurologic: Patient is alert Skin: Skin is warm Psychiatric: Patient has normal  mood and affect  Ortho Exam: Ortho exam demonstrates no pain with internal rotation or external rotation of the left hip joint.  Negative Stinchfield sign.  Positive straight leg raise of the left lower extremity.  No calf tenderness.  Negative Homans' sign.  5/5 motor strength of bilateral hip flexion, quadricep, dorsiflexion, plantarflexion.  4/5 motor strength of left hamstring compared with 5/5 motor strength of right hamstring.  No evidence of clonus.  2+ patellar tendon reflexes bilaterally.  Tenderness throughout the axial lumbar spine.  Specialty Comments:  No specialty comments available.  Imaging: No results found.   PMFS History: Patient Active Problem List   Diagnosis Date Noted   Dyslipidemia 07/27/2019   Essential hypertension  07/27/2019   Upper GI bleed 07/27/2019   Diverticulitis 07/27/2019   Gastritis 07/27/2019   Melena 07/23/2019   Concussion with loss of consciousness 06/02/2014   Left-sided weakness 05/28/2014   Syncope and collapse 05/28/2014   Scalp contusion 05/28/2014   Hypertension    Past Medical History:  Diagnosis Date   Depression    Hypertension    IBS (irritable bowel syndrome)    Malignant melanoma ([place])     Family History  Problem Relation Age of Onset   Heart attack Mother    Hypertension Mother    Cancer Father    Diabetes Father    Hypertension Father    Breast cancer Paternal Grandfather     Past Surgical History:  Procedure Laterality Date   BLADDER REPAIR     ESOPHAGOGASTRODUODENOSCOPY (EGD) WITH PROPOFOL N/A 07/24/2019   Procedure: ESOPHAGOGASTRODUODENOSCOPY (EGD) WITH PROPOFOL;  Surgeon: Wilford Corner, MD;  Location: [facility];  Service: Endoscopy;  Laterality: N/A;   FOOT SURGERY Bilateral    HERNIA REPAIR     MELANOMA EXCISION     VAGINAL HYSTERECTOMY     Social History   Occupational History   Occupation: retired  Tobacco Use   Smoking status: Never   Smokeless tobacco: Never  Vaping Use   Vaping Use: Never used  Substance and Sexual Activity   Alcohol use: No   Drug use: No   Sexual activity: Not on file

## 2021-06-23 ENCOUNTER — Encounter: Payer: Self-pay | Admitting: Orthopedic Surgery

## 2021-06-25 ENCOUNTER — Telehealth: Payer: Self-pay | Admitting: Orthopedic Surgery

## 2021-06-26 ENCOUNTER — Other Ambulatory Visit: Payer: Self-pay

## 2021-06-26 ENCOUNTER — Ambulatory Visit
Admission: RE | Admit: 2021-06-26 | Discharge: 2021-06-26 | Disposition: A | Discharge status: Home / Self Care | Payer: PPO | Source: Ambulatory Visit | Attending: Orthopedic Surgery | Admitting: Orthopedic Surgery

## 2021-06-26 DIAGNOSIS — M545 Low back pain, unspecified: Secondary | ICD-10-CM | POA: Diagnosis not present

## 2021-06-28 ENCOUNTER — Telehealth: Payer: Self-pay | Admitting: Physical Medicine and Rehabilitation

## 2021-06-28 NOTE — Telephone Encounter (Signed)
Pt called requesting an appt. Please call pt at [phone].

## 2021-07-01 NOTE — Telephone Encounter (Signed)
Left message #1

## 2021-07-03 NOTE — Telephone Encounter (Signed)
Called patient back and left message #2.

## 2021-07-03 NOTE — Telephone Encounter (Signed)
Pt returned call and would like to set up an apt with Dr. Ernestina Patches

## 2021-07-04 DIAGNOSIS — H2589 Other age-related cataract: Secondary | ICD-10-CM | POA: Diagnosis not present

## 2021-07-04 DIAGNOSIS — H33302 Unspecified retinal break, left eye: Secondary | ICD-10-CM | POA: Diagnosis not present

## 2021-07-04 DIAGNOSIS — Z961 Presence of intraocular lens: Secondary | ICD-10-CM | POA: Diagnosis not present

## 2021-07-04 DIAGNOSIS — H5703 Miosis: Secondary | ICD-10-CM | POA: Diagnosis not present

## 2021-07-04 DIAGNOSIS — H31092 Other chorioretinal scars, left eye: Secondary | ICD-10-CM | POA: Diagnosis not present

## 2021-07-04 DIAGNOSIS — H43812 Vitreous degeneration, left eye: Secondary | ICD-10-CM | POA: Diagnosis not present

## 2021-07-04 DIAGNOSIS — H40013 Open angle with borderline findings, low risk, bilateral: Secondary | ICD-10-CM | POA: Diagnosis not present

## 2021-07-04 DIAGNOSIS — H04123 Dry eye syndrome of bilateral lacrimal glands: Secondary | ICD-10-CM | POA: Diagnosis not present

## 2021-07-04 DIAGNOSIS — Z9889 Other specified postprocedural states: Secondary | ICD-10-CM | POA: Diagnosis not present

## 2021-07-04 NOTE — Progress Notes (Signed)
Does she have follow-up?

## 2021-07-08 ENCOUNTER — Telehealth: Payer: Self-pay | Admitting: Physical Medicine and Rehabilitation

## 2021-07-08 NOTE — Telephone Encounter (Signed)
Left message #3

## 2021-07-08 NOTE — Telephone Encounter (Signed)
See previous message

## 2021-07-08 NOTE — Telephone Encounter (Signed)
Patient called. She would like an appointment with Dr. Newton.  

## 2021-07-10 NOTE — Telephone Encounter (Signed)
Unable to reach patient.  Closing encounter.

## 2021-07-16 ENCOUNTER — Telehealth: Payer: Self-pay | Admitting: Physical Medicine and Rehabilitation

## 2021-07-16 NOTE — Telephone Encounter (Signed)
Called patient again to discuss. Phone went to voicemail after one ring. Left message #4.

## 2021-07-16 NOTE — Telephone Encounter (Signed)
Called patient again at [phone]. Phone rang once and went to voicemail. Left message #5. This is the only contact number listed in patient's chart. Patient has not signed a DPR, so unable to contact anyone else to schedule the patient.

## 2021-07-16 NOTE — Telephone Encounter (Signed)
Pt husband called and states that she has been trying to get in for a month. I let him know that they were called 3 times. Can you get her scheduled?  CB [phone]

## 2021-07-17 NOTE — Telephone Encounter (Signed)
Left message #6.

## 2021-07-19 ENCOUNTER — Encounter: Payer: Self-pay | Admitting: Physical Medicine and Rehabilitation

## 2021-07-19 ENCOUNTER — Ambulatory Visit: Payer: PPO | Admitting: Physical Medicine and Rehabilitation

## 2021-07-19 ENCOUNTER — Other Ambulatory Visit: Payer: Self-pay

## 2021-07-19 VITALS — BP 148/81 | HR 78

## 2021-07-19 DIAGNOSIS — M5442 Lumbago with sciatica, left side: Secondary | ICD-10-CM

## 2021-07-19 DIAGNOSIS — M5416 Radiculopathy, lumbar region: Secondary | ICD-10-CM | POA: Diagnosis not present

## 2021-07-19 DIAGNOSIS — M533 Sacrococcygeal disorders, not elsewhere classified: Secondary | ICD-10-CM | POA: Diagnosis not present

## 2021-07-19 DIAGNOSIS — G8929 Other chronic pain: Secondary | ICD-10-CM

## 2021-07-19 NOTE — Progress Notes (Signed)
[name] - 75 y.o. female MRN [id number]  Date of birth: July 26, 1946  Office Visit Note: Visit Date: 07/19/2021 PCP: Alroy Dust, L.Marlou Sa, MD Referred by: Alroy Dust, L.Marlou Sa, MD  Subjective: Chief Complaint  Patient presents with   Lower Back - Pain   HPI: [name] is a 75 y.o. female who comes in today For evaluation of left lower back pain radiating to buttock, hip and down posterior leg. Left buttock pain is most severe. Patient states pain is exacerbated by walking, prolonged standing and bending over. Patient describes pain as soreness and aching, currently rates as 7 out of 10. Patient reports some relief with rest and Tylenol. Patient's recent lumbar MRI exhibits stable lumbar spine degeneration that is less than typical for age, no nerve impingement noted, no high grade spinal canal stenosis. Patient had right L5-S1 interlaminar epidural steroid injection on 04/09/21, which she reports gave her greater than 50% relief for 2 weeks. Her history which I think may be confusing to the patient and her husband who is present today is that she had a disc herniation a long time ago when we first saw her maybe as far back as 2013.  That disc herniation has basically involuted and resolved at this point and there is no disc herniation anymore.  She denies any red flag complaints or focal weakness.  She has had no recent physical therapy or chiropractic care. Patient denies focal weakness, numbness and tingling. Patient denies recent trauma or falls.   Review of Systems  Neurological:  Negative for tingling, sensory change, focal weakness and weakness.  All other systems reviewed and are negative. Otherwise per HPI.  Assessment & Plan: Visit Diagnoses:    ICD-10-CM   1. 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 Lumbar radiculopathy  M54.16     3. Chronic left-sided low back pain with left-sided sciatica  M54.42    G89.29        Plan: Findings:   Chronic worsening severe low back and left buttock and leg pain.  Differential diagnosis is continued radicular pain despite the fact that prior disc herniation for many years ago has involuted and resolved.  Symptoms and physical exam consistent with sacroiliac joint type pain as well.  Last injection diagnostically did seem to help but did not last very long.  This was an epidural injection which she has had many times.  I think at this point the right answer is to try diagnostic sacroiliac joint injection to see differentially how much help it gives her.  We will continue to explain to the patient that the prior leg pain and disc herniation seem to fit more with the epidural type injection and now that seems to be possibly something different.  I think there was some confusion here.  I did encourage physical therapy but she does not want to do that at all.  There was not really a voiced reason for this but just a desire not to complete therapy.  We will see how she does with diagnostic sacroiliac joint injection with fluoroscopic guidance.  Depending on relief would look at repeat epidural injection versus transforaminal epidural injection. Continue with activity modification and may be home exercise program that we could direct. No red flag symptoms noted upon exam today.    Meds & Orders: No orders of the defined types were placed in this encounter.   Orders Placed This Encounter  Procedures   Ambulatory referral  to Physical Medicine Rehab    Follow-up: Return in about 1 week (around 07/26/2021) for Left sacroiliac joint injection.   Procedures: No procedures performed      Clinical History: MRI LUMBAR SPINE WITHOUT CONTRAST   TECHNIQUE: Multiplanar, multisequence MR imaging of the lumbar spine was performed. No intravenous contrast was administered.   COMPARISON:  12/23/2013   FINDINGS: Segmentation:  5 lumbar type vertebrae   Alignment:  Physiologic.   Vertebrae: No fracture,  evidence of discitis, or aggressive bone lesion.   Conus medullaris and cauda equina: Conus extends to the T12-L1 level. Conus and cauda equina appear normal.   Paraspinal and other soft tissues: No evidence of perispinal mass or inflammation   Disc levels:   Well preserved disc height and hydration for age. No advanced or progressive degenerative findings. Minor generalized disc bulging with L4-5 annular fissure. The canal and foramina are diffusely patent. Negative facets.   IMPRESSION: Stable lumbar spine degeneration that is less than typical for age. No neural impingement or visible inflammation.     Electronically Signed   By: Jorje Guild M.D.   On: 06/26/2021 10:20   She reports that she has never smoked. She has never used smokeless tobacco. No results for input(s): HGBA1C, LABURIC in the last 8760 hours.  Objective:  VS:  HT:    WT:   BMI:     BP:(!) 148/81  HR:78bpm  TEMP: ( )  RESP:  Physical Exam Vitals and nursing note reviewed.  Constitutional:      General: She is not in acute distress.    Appearance: Normal appearance. She is not ill-appearing.  HENT:     Head: Normocephalic and atraumatic.     Right Ear: External ear normal.     Left Ear: External ear normal.  Eyes:     Extraocular Movements: Extraocular movements intact.  Cardiovascular:     Rate and Rhythm: Normal rate.     Pulses: Normal pulses.  Pulmonary:     Effort: Pulmonary effort is normal. No respiratory distress.  Abdominal:     General: There is no distension.     Palpations: Abdomen is soft.  Musculoskeletal:        General: Tenderness present.     Cervical back: Neck supple.     Right lower leg: No edema.     Left lower leg: No edema.     Comments: Patient has good distal strength with no pain over the greater trochanters.  No clonus or focal weakness.  Positive Fortin finger sign on the left.  Skin:    Findings: No erythema, lesion or rash.  Neurological:     General:  No focal deficit present.     Mental Status: She is alert and oriented to person, place, and time.     Sensory: No sensory deficit.     Motor: No weakness or abnormal muscle tone.     Coordination: Coordination normal.  Psychiatric:        Mood and Affect: Mood normal.        Behavior: Behavior normal.    Ortho Exam  Imaging: No results found.  Past Medical/Family/Surgical/Social History: Medications & Allergies reviewed per EMR, new medications updated. Patient Active Problem List   Diagnosis Date Noted   Dyslipidemia 07/27/2019   Essential hypertension 07/27/2019   Upper GI bleed 07/27/2019   Diverticulitis 07/27/2019   Gastritis 07/27/2019   Melena 07/23/2019   Concussion with loss of consciousness 06/02/2014   Left-sided weakness  05/28/2014   Syncope and collapse 05/28/2014   Scalp contusion 05/28/2014   Hypertension    Past Medical History:  Diagnosis Date   Depression    Hypertension    IBS (irritable bowel syndrome)    Malignant melanoma ([place])    Family History  Problem Relation Age of Onset   Heart attack Mother    Hypertension Mother    Cancer Father    Diabetes Father    Hypertension Father    Breast cancer Paternal Grandfather    Past Surgical History:  Procedure Laterality Date   BLADDER REPAIR     ESOPHAGOGASTRODUODENOSCOPY (EGD) WITH PROPOFOL N/A 07/24/2019   Procedure: ESOPHAGOGASTRODUODENOSCOPY (EGD) WITH PROPOFOL;  Surgeon: Wilford Corner, MD;  Location: [facility];  Service: Endoscopy;  Laterality: N/A;   FOOT SURGERY Bilateral    HERNIA REPAIR     MELANOMA EXCISION     VAGINAL HYSTERECTOMY     Social History   Occupational History   Occupation: retired  Tobacco Use   Smoking status: Never   Smokeless tobacco: Never  Vaping Use   Vaping Use: Never used  Substance and Sexual Activity   Alcohol use: No   Drug use: No   Sexual activity: Not on file

## 2021-07-19 NOTE — Progress Notes (Signed)
Pt state lower back pain. Pt state sitting, laying and bending makes the pain worse. Pt state she takes over the counter pain meds and uses ice to help ease her pain. Pt has hx of inj on 04/09/21 pt state it helped for a while then the other sider started to hurt.  Numeric Pain Rating Scale and Functional Assessment Average Pain 10 Pain Right Now 8 My pain is intermittent, burning, dull, and aching Pain is worse with: bending, sitting, standing, and some activites Pain improves with: heat/ice and medication   In the last MONTH (on 0-10 scale) has pain interfered with the following?  1. General activity like being  able to carry out your everyday physical activities such as walking, climbing stairs, carrying groceries, or moving a chair?  Rating(7)  2. Relation with others like being able to carry out your usual social activities and roles such as  activities at home, at work and in your community. Rating(8)  3. Enjoyment of life such that you have  been bothered by emotional problems such as feeling anxious, depressed or irritable?  Rating(9)

## 2021-07-24 ENCOUNTER — Ambulatory Visit: Payer: PPO | Admitting: Physical Medicine and Rehabilitation

## 2021-08-04 NOTE — Progress Notes (Signed)
Does she need f/u or shots?

## 2021-08-04 NOTE — Progress Notes (Signed)
She saw Dr. Ernestina Patches and Jinny Blossom to review scan on 10/7.  Decided on plan for left SI joint injection but she never returned for that injection and has no follow-ups scheduled at this time

## 2021-08-06 NOTE — Progress Notes (Signed)
Thx I called

## 2021-08-08 ENCOUNTER — Ambulatory Visit: Payer: PPO

## 2021-08-08 ENCOUNTER — Other Ambulatory Visit: Payer: PPO

## 2021-09-09 DIAGNOSIS — J309 Allergic rhinitis, unspecified: Secondary | ICD-10-CM | POA: Diagnosis not present

## 2021-09-09 DIAGNOSIS — I10 Essential (primary) hypertension: Secondary | ICD-10-CM | POA: Diagnosis not present

## 2021-09-09 DIAGNOSIS — N3000 Acute cystitis without hematuria: Secondary | ICD-10-CM | POA: Diagnosis not present

## 2021-09-09 DIAGNOSIS — I700 Atherosclerosis of aorta: Secondary | ICD-10-CM | POA: Diagnosis not present

## 2021-09-09 DIAGNOSIS — M25569 Pain in unspecified knee: Secondary | ICD-10-CM | POA: Diagnosis not present

## 2021-09-09 DIAGNOSIS — J45909 Unspecified asthma, uncomplicated: Secondary | ICD-10-CM | POA: Diagnosis not present

## 2021-09-09 DIAGNOSIS — E039 Hypothyroidism, unspecified: Secondary | ICD-10-CM | POA: Diagnosis not present

## 2021-09-09 DIAGNOSIS — F411 Generalized anxiety disorder: Secondary | ICD-10-CM | POA: Diagnosis not present

## 2021-09-09 DIAGNOSIS — E782 Mixed hyperlipidemia: Secondary | ICD-10-CM | POA: Diagnosis not present

## 2021-12-05 DIAGNOSIS — M7989 Other specified soft tissue disorders: Secondary | ICD-10-CM | POA: Diagnosis not present

## 2021-12-05 DIAGNOSIS — R600 Localized edema: Secondary | ICD-10-CM | POA: Diagnosis not present

## 2021-12-05 DIAGNOSIS — I10 Essential (primary) hypertension: Secondary | ICD-10-CM | POA: Diagnosis not present

## 2021-12-05 DIAGNOSIS — M545 Low back pain, unspecified: Secondary | ICD-10-CM | POA: Diagnosis not present

## 2021-12-06 ENCOUNTER — Ambulatory Visit
Admission: RE | Admit: 2021-12-06 | Discharge: 2021-12-06 | Disposition: A | Discharge status: Home / Self Care | Payer: PPO | Source: Ambulatory Visit | Attending: Internal Medicine | Admitting: Internal Medicine

## 2021-12-06 ENCOUNTER — Other Ambulatory Visit: Payer: Self-pay | Admitting: Internal Medicine

## 2021-12-06 DIAGNOSIS — R109 Unspecified abdominal pain: Secondary | ICD-10-CM | POA: Diagnosis not present

## 2021-12-06 DIAGNOSIS — N200 Calculus of kidney: Secondary | ICD-10-CM

## 2021-12-06 DIAGNOSIS — K5730 Diverticulosis of large intestine without perforation or abscess without bleeding: Secondary | ICD-10-CM | POA: Diagnosis not present

## 2021-12-30 ENCOUNTER — Telehealth: Payer: Self-pay | Admitting: Physical Medicine and Rehabilitation

## 2021-12-30 NOTE — Telephone Encounter (Signed)
Patient called. Would like an appointment with Dr. Ernestina Patches. Her call back number is [phone] ?

## 2022-01-01 ENCOUNTER — Telehealth: Payer: Self-pay

## 2022-01-01 NOTE — Telephone Encounter (Signed)
Pt called wanting repeat of right L5-S1 IL, pt has same pain and same side. Pt state the inj for her back has helped. Pt was referral to PT but never went. Please advise ?

## 2022-01-06 ENCOUNTER — Encounter: Payer: Self-pay | Admitting: Physical Medicine and Rehabilitation

## 2022-01-06 ENCOUNTER — Ambulatory Visit: Payer: PPO | Admitting: Physical Medicine and Rehabilitation

## 2022-01-06 ENCOUNTER — Other Ambulatory Visit: Payer: Self-pay | Admitting: Physical Medicine and Rehabilitation

## 2022-01-06 ENCOUNTER — Other Ambulatory Visit: Payer: Self-pay

## 2022-01-06 VITALS — BP 125/81 | HR 59

## 2022-01-06 DIAGNOSIS — G8929 Other chronic pain: Secondary | ICD-10-CM | POA: Diagnosis not present

## 2022-01-06 DIAGNOSIS — M5416 Radiculopathy, lumbar region: Secondary | ICD-10-CM | POA: Diagnosis not present

## 2022-01-06 DIAGNOSIS — M5441 Lumbago with sciatica, right side: Secondary | ICD-10-CM

## 2022-01-06 DIAGNOSIS — M4726 Other spondylosis with radiculopathy, lumbar region: Secondary | ICD-10-CM | POA: Diagnosis not present

## 2022-01-06 MED ORDER — CYCLOBENZAPRINE HCL 10 MG PO TABS: 10.0000 mg | ORAL_TABLET | Freq: Two times a day (BID) | ORAL | 0 refills | Status: DC | PRN

## 2022-01-06 NOTE — Progress Notes (Signed)
? ?[name] - 76 y.o. female MRN [id number]  Date of birth: December 21, 1945 ? ?Office Visit Note: ?Visit Date: 01/06/2022 ?PCP: Alroy Dust, L.Marlou Sa, MD ?Referred by: Alroy Dust, L.Marlou Sa, MD ? ?Subjective: ?Chief Complaint  ?Patient presents with  ? Lower Back - Pain  ? Right Leg - Pain  ? Left Leg - Pain  ? ?HPI: [name] is a 76 y.o. female who comes in today for evaluation of chronic, worsening and severe right lower back pain radiating to buttock and down posterior leg to knee. Patient reports symptoms have been ongoing for several years, however pain worsened several weeks ago while helping care for a friend. Patient reports pain is exacerbated by movement and activity, describes as a constant sore and burning sensation, currently rates as 9 out of 10. Patient reports some relief of pain with home exercise regimen, use of heat/ice and medications. Patient states she does take Flexeril as needed for muscle spasms. Patients lumbar MRI from 2022 exhibits stable lumbar spine degeneration that is less than typical for age, no evidence of neural impingement/spinal canal stenosis. Patient had right L5-S1 interlaminar epidural steroid injection performed in our office on 04/09/2021 and reports significant and sustained pain relief until recently. Patient states her pain is limiting daily activity and she would like to repeat injection as soon as possible. Patient denies focal weakness, numbness and tingling. Patient denies recent trauma or falls.  ? ?Review of Systems  ?Musculoskeletal:  Positive for back pain.  ?Neurological:  Negative for tingling, sensory change, focal weakness and weakness.  ?All other systems reviewed and are negative. Otherwise per HPI. ? ?Assessment & Plan: ?Visit Diagnoses:  ?  ICD-10-CM   ?1. Lumbar radiculopathy  M54.16 DG INJECT DIAG/THERA/INC NEEDLE/CATH/PLC EPI/LUMB/SAC W/IMG  ?  CANCELED: Ambulatory referral to Physical Medicine Rehab  ?  ?2. Chronic right-sided low back pain with  right-sided sciatica  M54.41 DG INJECT DIAG/THERA/INC NEEDLE/CATH/PLC EPI/LUMB/SAC W/IMG  ? G89.29 CANCELED: Ambulatory referral to Physical Medicine Rehab  ?  ?3. Other spondylosis with radiculopathy, lumbar region  M47.26 DG INJECT DIAG/THERA/INC NEEDLE/CATH/PLC EPI/LUMB/SAC W/IMG  ?  CANCELED: Ambulatory referral to Physical Medicine Rehab  ?  ?   ?Plan: Findings:  ?Chronic, worsening and severe right sided lower back pain radiating to buttock and down posterior thigh to knee. Patient continues to have excruciating and debilitating pain despite good conservative therapies such as home exercise regimen, use of heat/ice, rest and medications as needed. Patients clinical presentation and exam are consistent with S1 nerve pattern, her lumbar MRI from 2022 does not directly correlate with her symptoms and is considered less than typical for her age. We believe the next step is to repeat right L5-S1 interlaminar epidural steroid injection under fluoroscopic guidance. Patient is not currently undergoing long term anticoagulant therapy. No red flag symptoms noted.  ? ?We did attempt to schedule patient quickly, however she states she can't wait several weeks and is requesting to have epidural steroid injection performed at [facility]. I did change order to reflect patients request and informed her that imaging center will call her to schedule.  ? ? ? ?  ? ?Meds & Orders:  ?Meds ordered this encounter  ?Medications  ? cyclobenzaprine (FLEXERIL) 10 MG tablet  ?  Sig: Take 1 tablet (10 mg total) by mouth 2 (two) times daily as needed for muscle spasms.  ?  Dispense:  30 tablet  ?  Refill:  0  ?  Order Specific Question:   Supervising  Provider  ?  AnswerMagnus Sinning [[id number]]  ?  ?Orders Placed This Encounter  ?Procedures  ? DG INJECT DIAG/THERA/INC NEEDLE/CATH/PLC EPI/LUMB/SAC W/IMG  ?  ?Follow-up: Return for Right L5-S1 interlaminar epidural steroid injection [facility]).   ? ?Procedures: ?No procedures performed  ?   ? ?Clinical History: ?EXAM: ?MRI LUMBAR SPINE WITHOUT CONTRAST ?  ?TECHNIQUE: ?Multiplanar, multisequence MR imaging of the lumbar spine was ?performed. No intravenous contrast was administered. ?  ?COMPARISON:  12/23/2013 ?  ?FINDINGS: ?Segmentation:  5 lumbar type vertebrae ?  ?Alignment:  Physiologic. ?  ?Vertebrae: No fracture, evidence of discitis, or aggressive bone ?lesion. ?  ?Conus medullaris and cauda equina: Conus extends to the T12-L1 ?level. Conus and cauda equina appear normal. ?  ?Paraspinal and other soft tissues: No evidence of perispinal mass or ?inflammation ?  ?Disc levels: ?  ?Well preserved disc height and hydration for age. No advanced or ?progressive degenerative findings. Minor generalized disc bulging ?with L4-5 annular fissure. The canal and foramina are diffusely ?patent. Negative facets. ?  ?IMPRESSION: ?Stable lumbar spine degeneration that is less than typical for age. ?No neural impingement or visible inflammation. ?  ?  ?Electronically Signed ?  By: Jorje Guild M.D. ?  On: 06/26/2021 10:20  ? ?She reports that she has never smoked. She has never used smokeless tobacco. No results for input(s): HGBA1C, LABURIC in the last 8760 hours. ? ?Objective:  VS:  HT:    WT:   BMI:     BP:125/81  HR:(!) 59bpm  TEMP: ( )  RESP:  ?Physical Exam ?Vitals and nursing note reviewed.  ?HENT:  ?   Head: Normocephalic and atraumatic.  ?   Right Ear: External ear normal.  ?   Left Ear: External ear normal.  ?   Nose: Nose normal.  ?   Mouth/Throat:  ?   Mouth: Mucous membranes are moist.  ?Eyes:  ?   Extraocular Movements: Extraocular movements intact.  ?Cardiovascular:  ?   Rate and Rhythm: Normal rate.  ?   Pulses: Normal pulses.  ?Pulmonary:  ?   Effort: Pulmonary effort is normal.  ?Abdominal:  ?   General: Abdomen is flat. There is no distension.  ?Musculoskeletal:     ?   General: Tenderness present.  ?   Cervical back: Normal range of motion.   ?   Comments: Pt rises from seated position to standing without difficulty. Good lumbar range of motion. Strong distal strength without clonus, no pain upon palpation of greater trochanters. Dysesthesias noted right S1 dermatome. Sensation intact bilaterally. Walks independently, gait steady.   ?Skin: ?   General: Skin is warm and dry.  ?   Capillary Refill: Capillary refill takes less than 2 seconds.  ?Neurological:  ?   General: No focal deficit present.  ?   Mental Status: She is alert and oriented to person, place, and time.  ?Psychiatric:     ?   Mood and Affect: Mood normal.     ?   Behavior: Behavior normal.  ?  ?Ortho Exam ? ?Imaging: ?No results found. ? ?Past Medical/Family/Surgical/Social History: ?Medications & Allergies reviewed per EMR, new medications updated. ?Patient Active Problem List  ? Diagnosis Date Noted  ? Dyslipidemia 07/27/2019  ? Essential hypertension 07/27/2019  ? Upper GI bleed 07/27/2019  ? Diverticulitis 07/27/2019  ? Gastritis 07/27/2019  ? Melena 07/23/2019  ? Concussion with loss of consciousness 06/02/2014  ? Left-sided weakness 05/28/2014  ? Syncope  and collapse 05/28/2014  ? Scalp contusion 05/28/2014  ? Hypertension   ? ?Past Medical History:  ?Diagnosis Date  ? Depression   ? Hypertension   ? IBS (irritable bowel syndrome)   ? Malignant melanoma ([place])   ? ?Family History  ?Problem Relation Age of Onset  ? Heart attack Mother   ? Hypertension Mother   ? Cancer Father   ? Diabetes Father   ? Hypertension Father   ? Breast cancer Paternal Grandfather   ? ?Past Surgical History:  ?Procedure Laterality Date  ? BLADDER REPAIR    ? ESOPHAGOGASTRODUODENOSCOPY (EGD) WITH PROPOFOL N/A 07/24/2019  ? Procedure: ESOPHAGOGASTRODUODENOSCOPY (EGD) WITH PROPOFOL;  Surgeon: Wilford Corner, MD;  Location: [facility] ENDOSCOPY;  Service: Endoscopy;  Laterality: N/A;  ? FOOT SURGERY Bilateral   ? HERNIA REPAIR    ? MELANOMA EXCISION    ? VAGINAL HYSTERECTOMY    ? ?Social History  ? ?Occupational  History  ? Occupation: retired  ?Tobacco Use  ? Smoking status: Never  ? Smokeless tobacco: Never  ?Vaping Use  ? Vaping Use: Never used  ?Substance and Sexual Activity  ? Alcohol use: No  ? Drug use: No  ? Sexual activity: Not on f

## 2022-01-06 NOTE — Progress Notes (Signed)
Pt has hx of inj on 04/09/21 pt state it helped. Pt state lower back pain that travels down both legs, mostly her right. Pt state walking, standing and sitting makes the pan worse. Pt state she takes over the counter pain meds and heat, ice to help ease her pain. ? ?Numeric Pain Rating Scale and Functional Assessment ?Average Pain 10 ?Pain Right Now 9 ?My pain is constant, stabbing, tingling, and aching ?Pain is worse with: walking, bending, sitting, standing, and some activites ?Pain improves with: heat/ice, medication, and injections ? ? ?In the last MONTH (on 0-10 scale) has pain interfered with the following? ? ?1. General activity like being  able to carry out your everyday physical activities such as walking, climbing stairs, carrying groceries, or moving a chair?  ?Rating(6) ? ?2. Relation with others like being able to carry out your usual social activities and roles such as  activities at home, at work and in your community. ?Rating(7) ? ?3. Enjoyment of life such that you have  been bothered by emotional problems such as feeling anxious, depressed or irritable?  ?Rating(8) ? ?

## 2022-01-07 ENCOUNTER — Telehealth: Payer: Self-pay | Admitting: Physical Medicine and Rehabilitation

## 2022-01-07 ENCOUNTER — Ambulatory Visit
Admission: RE | Admit: 2022-01-07 | Discharge: 2022-01-07 | Disposition: A | Discharge status: Home / Self Care | Payer: PPO | Source: Ambulatory Visit | Attending: Physical Medicine and Rehabilitation | Admitting: Physical Medicine and Rehabilitation

## 2022-01-07 DIAGNOSIS — M5416 Radiculopathy, lumbar region: Secondary | ICD-10-CM | POA: Diagnosis not present

## 2022-01-07 MED ORDER — IOPAMIDOL (ISOVUE-M 200) INJECTION 41%
1.0000 mL | Freq: Once | INTRAMUSCULAR | Status: AC
  Administered 2022-01-07: 1 mL via EPIDURAL

## 2022-01-07 MED ORDER — METHYLPREDNISOLONE ACETATE 40 MG/ML INJ SUSP (RADIOLOG
80.0000 mg | Freq: Once | INTRAMUSCULAR | Status: AC
Start: 2022-01-07 — End: 2022-01-07
  Administered 2022-01-07: 80 mg via EPIDURAL

## 2022-01-07 NOTE — Telephone Encounter (Signed)
Patient called advised she had the injection this morning at [facility] and will need to cancel her appointment with Dr. Ernestina Patches. The number to contact patient is [phone] ?

## 2022-01-07 NOTE — Discharge Instructions (Signed)

## 2022-01-30 ENCOUNTER — Ambulatory Visit: Payer: PPO | Admitting: Physical Medicine and Rehabilitation

## 2022-02-24 NOTE — Telephone Encounter (Signed)
err

## 2022-02-28 DIAGNOSIS — F411 Generalized anxiety disorder: Secondary | ICD-10-CM | POA: Diagnosis not present

## 2022-02-28 DIAGNOSIS — R001 Bradycardia, unspecified: Secondary | ICD-10-CM | POA: Diagnosis not present

## 2022-02-28 DIAGNOSIS — G4700 Insomnia, unspecified: Secondary | ICD-10-CM | POA: Diagnosis not present

## 2022-02-28 DIAGNOSIS — E039 Hypothyroidism, unspecified: Secondary | ICD-10-CM | POA: Diagnosis not present

## 2022-02-28 DIAGNOSIS — I700 Atherosclerosis of aorta: Secondary | ICD-10-CM | POA: Diagnosis not present

## 2022-02-28 DIAGNOSIS — I493 Ventricular premature depolarization: Secondary | ICD-10-CM | POA: Diagnosis not present

## 2022-02-28 DIAGNOSIS — M545 Low back pain, unspecified: Secondary | ICD-10-CM | POA: Diagnosis not present

## 2022-02-28 DIAGNOSIS — J309 Allergic rhinitis, unspecified: Secondary | ICD-10-CM | POA: Diagnosis not present

## 2022-02-28 DIAGNOSIS — E782 Mixed hyperlipidemia: Secondary | ICD-10-CM | POA: Diagnosis not present

## 2022-02-28 DIAGNOSIS — Z0000 Encounter for general adult medical examination without abnormal findings: Secondary | ICD-10-CM | POA: Diagnosis not present

## 2022-02-28 DIAGNOSIS — N951 Menopausal and female climacteric states: Secondary | ICD-10-CM | POA: Diagnosis not present

## 2022-02-28 DIAGNOSIS — I10 Essential (primary) hypertension: Secondary | ICD-10-CM | POA: Diagnosis not present

## 2022-04-07 DIAGNOSIS — L309 Dermatitis, unspecified: Secondary | ICD-10-CM | POA: Diagnosis not present

## 2022-04-07 DIAGNOSIS — D225 Melanocytic nevi of trunk: Secondary | ICD-10-CM | POA: Diagnosis not present

## 2022-04-07 DIAGNOSIS — L814 Other melanin hyperpigmentation: Secondary | ICD-10-CM | POA: Diagnosis not present

## 2022-04-07 DIAGNOSIS — L538 Other specified erythematous conditions: Secondary | ICD-10-CM | POA: Diagnosis not present

## 2022-04-07 DIAGNOSIS — Z85820 Personal history of malignant melanoma of skin: Secondary | ICD-10-CM | POA: Diagnosis not present

## 2022-04-07 DIAGNOSIS — L298 Other pruritus: Secondary | ICD-10-CM | POA: Diagnosis not present

## 2022-04-07 DIAGNOSIS — L820 Inflamed seborrheic keratosis: Secondary | ICD-10-CM | POA: Diagnosis not present

## 2022-04-07 DIAGNOSIS — Z08 Encounter for follow-up examination after completed treatment for malignant neoplasm: Secondary | ICD-10-CM | POA: Diagnosis not present

## 2022-04-07 DIAGNOSIS — L821 Other seborrheic keratosis: Secondary | ICD-10-CM | POA: Diagnosis not present

## 2022-04-28 IMAGING — MR MR LUMBAR SPINE W/O CM
4 of 5 series · 27 of 48 positions shown · non-contrast
Comparison: 12/23/2013

CLINICAL DATA: Low back pain with left leg pain since February 2021

EXAM:
MRI LUMBAR SPINE WITHOUT CONTRAST
TECHNIQUE: Multiplanar, multisequence MR imaging of the lumbar spine was
performed. No intravenous contrast was administered.

[Series 3: T2 · sagittal · 4.0mm · 1.09mm/px · 5 of 15 slices shown (1 of 2)]
[im 1/15]
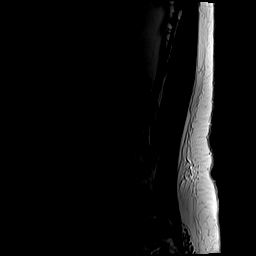
[im 4/15]
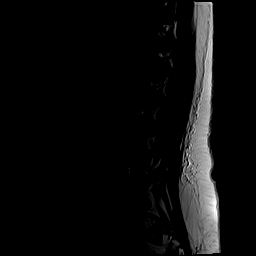
[im 8/15]
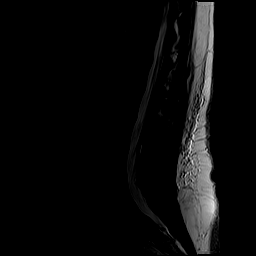
[im 11/15]
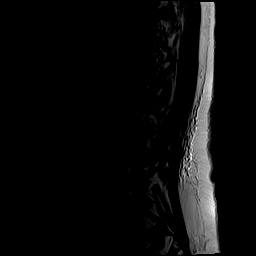
[im 15/15]
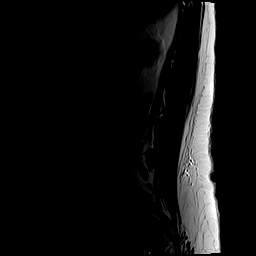

[Series 5: T1 · sagittal · 4.0mm · 1.09mm/px · 6 of 15 slices shown (1 of 2)]
[im 1/15]
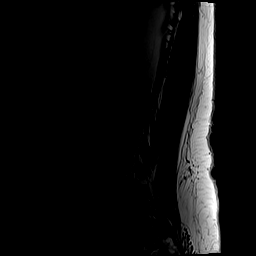
[im 3/15]
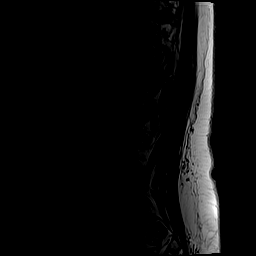
[im 6/15]
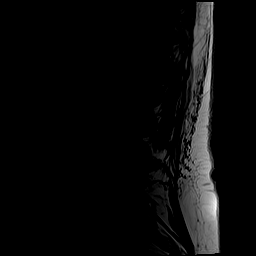
[im 9/15]
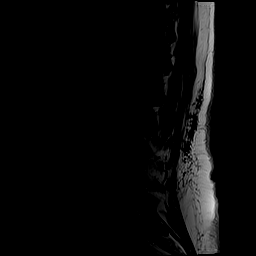
[im 12/15]
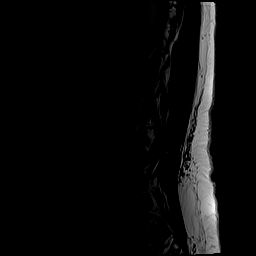
[im 15/15]
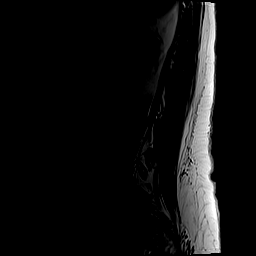

[Series 6: T2 · axial · 4.0mm · 0.39mm/px · z∈[-44,+160]mm · 10 of 42 slices shown (2 of 2)]
[im 3/42]
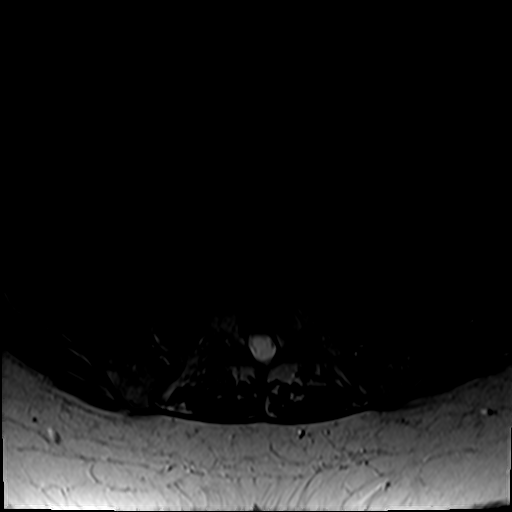
[im 6/42]
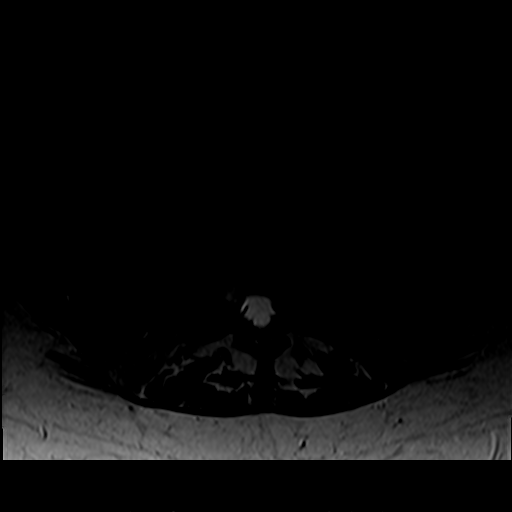
[im 9/42]
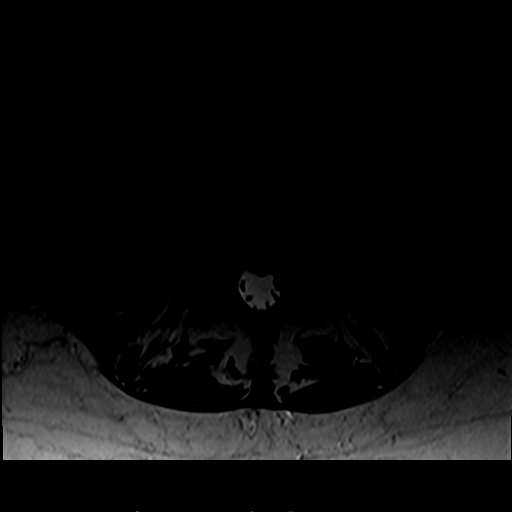
[im 14/42]
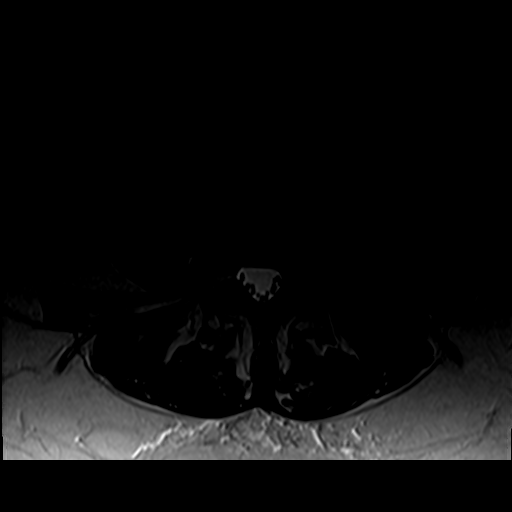
[im 20/42]
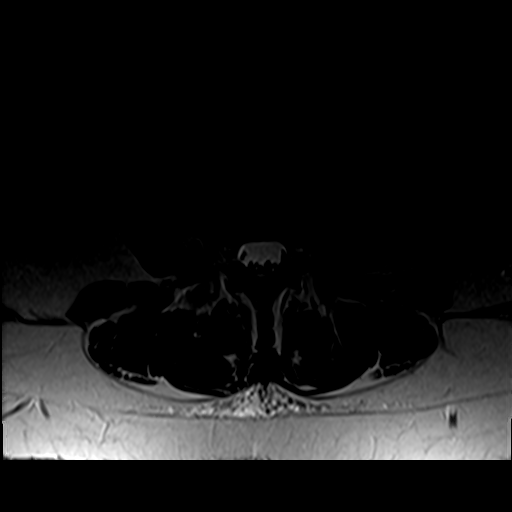
[im 22/42]
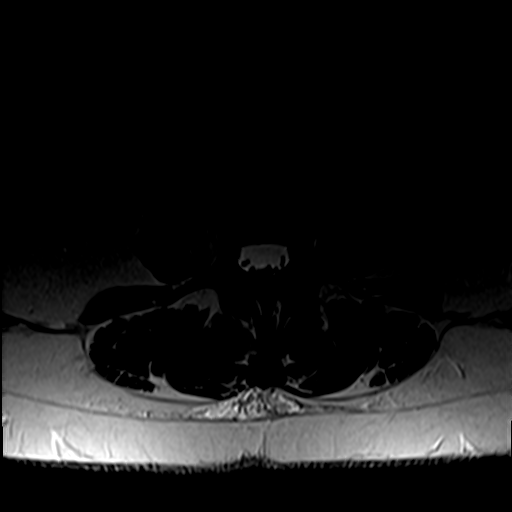
[im 25/42]
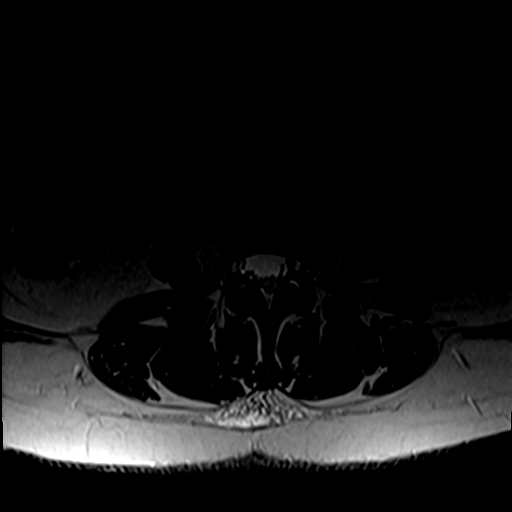
[im 31/42]
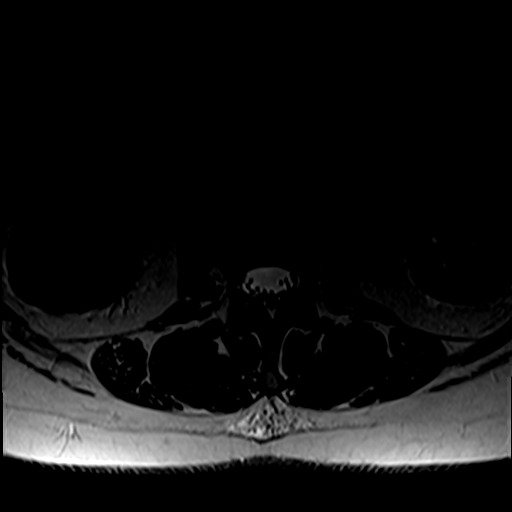
[im 36/42]
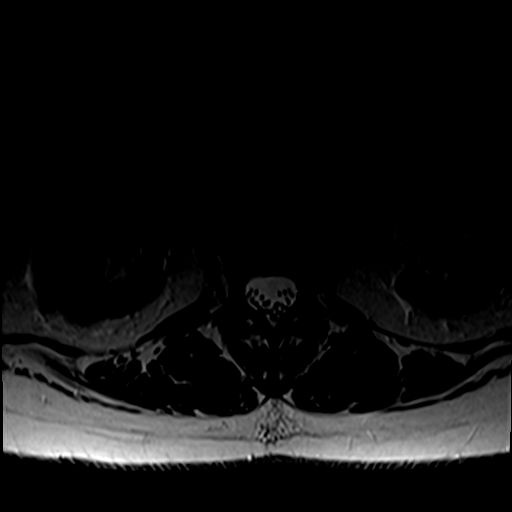
[im 42/42]
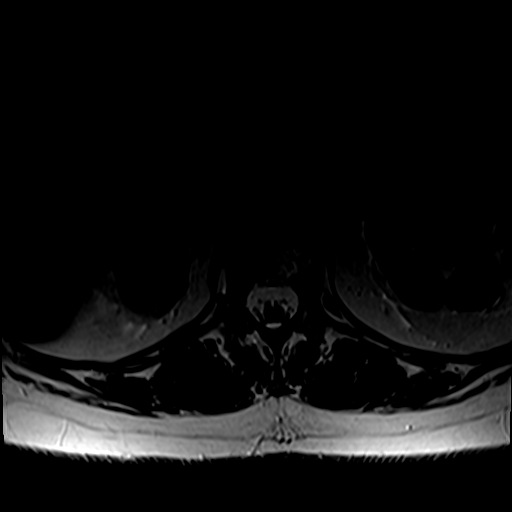

[Series 7: T1 · axial · 4.0mm · 0.39mm/px · z∈[-44,+131]mm · 6 of 42 slices shown (2 of 2)]
[im 3/42]
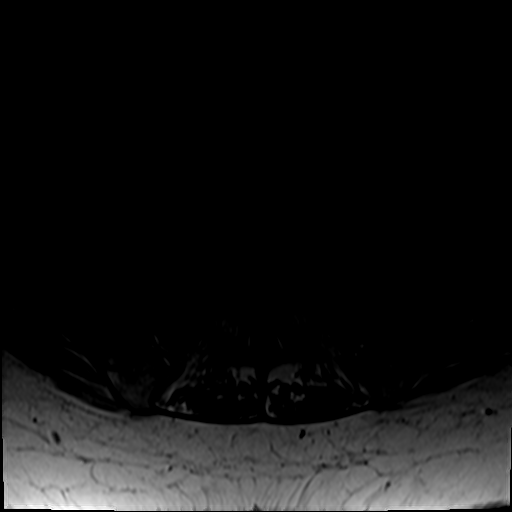
[im 6/42]
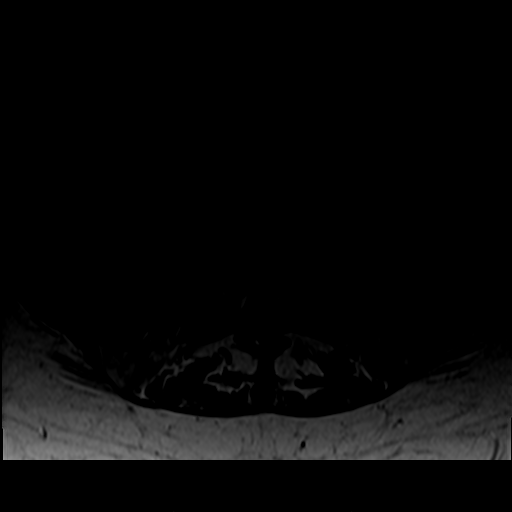
[im 9/42]
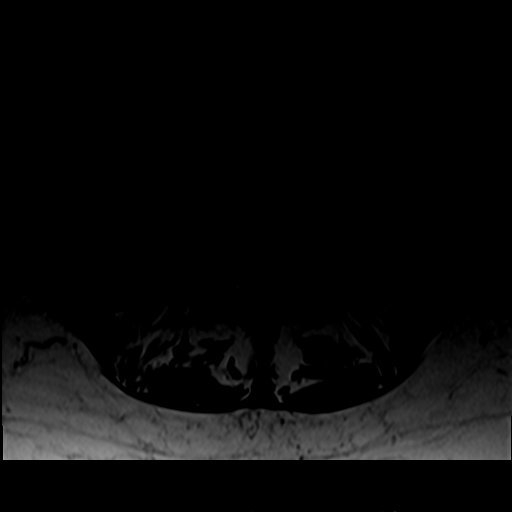
[im 14/42]
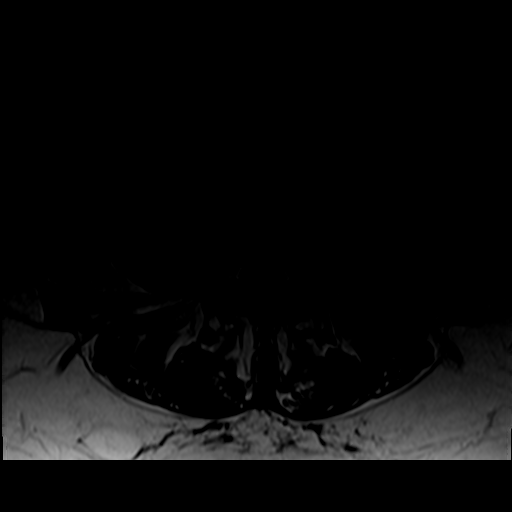
[im 22/42]
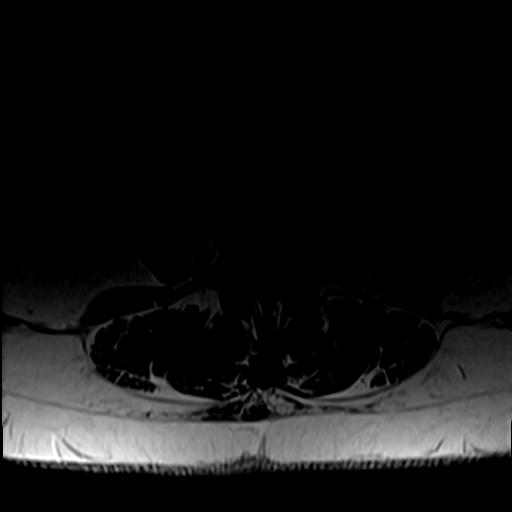
[im 36/42]
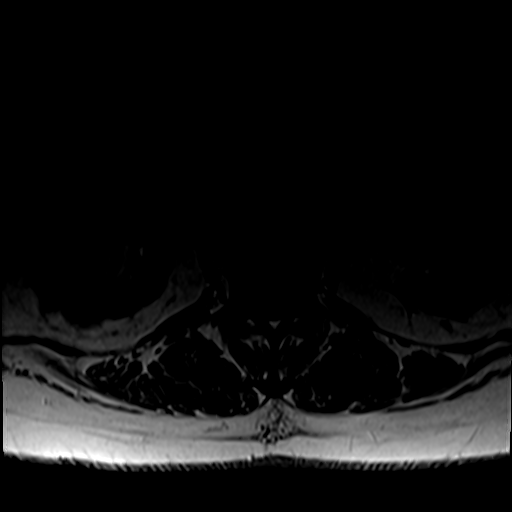

[27 of 48 positions shown; findings below may reference images not displayed]

FINDINGS: Segmentation:  5 lumbar type vertebrae

Alignment:  Physiologic.

Vertebrae: No fracture, evidence of discitis, or aggressive bone
lesion.

Conus medullaris and cauda equina: Conus extends to the T12-L1
level. Conus and cauda equina appear normal.

Paraspinal and other soft tissues: No evidence of perispinal mass or
inflammation

Disc levels:

Well preserved disc height and hydration for age. No advanced or
progressive degenerative findings. Minor generalized disc bulging
with L4-5 annular fissure. The canal and foramina are diffusely
patent. Negative facets.
IMPRESSION: Stable lumbar spine degeneration that is less than typical for age.
No neural impingement or visible inflammation.

## 2022-05-12 ENCOUNTER — Other Ambulatory Visit: Payer: Self-pay | Admitting: Internal Medicine

## 2022-05-12 DIAGNOSIS — Z1231 Encounter for screening mammogram for malignant neoplasm of breast: Secondary | ICD-10-CM

## 2022-05-15 ENCOUNTER — Ambulatory Visit: Payer: PPO | Admitting: Orthopedic Surgery

## 2022-05-15 ENCOUNTER — Ambulatory Visit: Payer: Self-pay

## 2022-05-15 DIAGNOSIS — M1812 Unilateral primary osteoarthritis of first carpometacarpal joint, left hand: Secondary | ICD-10-CM

## 2022-05-15 DIAGNOSIS — M19032 Primary osteoarthritis, left wrist: Secondary | ICD-10-CM | POA: Diagnosis not present

## 2022-05-15 DIAGNOSIS — M25532 Pain in left wrist: Secondary | ICD-10-CM | POA: Diagnosis not present

## 2022-05-15 NOTE — Progress Notes (Signed)
Office Visit Note   Patient: [name]           Date of Birth: 12-23-1945           MRN: [id number] Visit Date: 05/15/2022              Requested by: Alroy Dust, L.Marlou Sa, [place] [address] [employer] [address] [place],  [place] [address] PCP: Alroy Dust, L.Marlou Sa, MD   Assessment & Plan: Visit Diagnoses:  1. Pain in left wrist   2. Arthritis of carpometacarpal (CMC) joint of left thumb   3. 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  We discussed the nature of arthritis as well as his diagnosis, prognosis, both conservative internal treatment options.  We specifically discussed bracing, oral versus topical anti-inflammatory use, hand therapy, corticosteroid injection, and surgery.  Patient has not had any treatment for this so far.  After our discussion, patient would like to continue to observe her symptoms for now.  Follow-Up Instructions: No follow-ups on file.   Orders:  Orders Placed This Encounter  Procedures   XR Wrist Complete Left   No orders of the defined types were placed in this encounter.     Procedures: No procedures performed   Clinical Data: No additional findings.   Subjective: Chief Complaint  Patient presents with   Left Wrist - Pain    This is a 76 year old right-hand-dominant female who presents with pain around the base of her thumb and radial wrist.  Is been going on for many years now.  Her pain can be as bad as 4/10 in severity and localized to the base of the thumb and at the radial aspect of the wrist distal to the radial styloid.  She has difficulty with activities that involve weightbearing through the wrist.  She notes that her wrist is getting weaker.  She denies any injury to the area.  She had no treatment for this so far.    Review of Systems   Objective: Vital Signs: There were no vitals taken for this  visit.  Physical Exam Constitutional:      Appearance: Normal appearance.  Cardiovascular:     Rate and Rhythm: Normal rate.     Pulses: Normal pulses.  Pulmonary:     Effort: Pulmonary effort is normal.  Skin:    General: Skin is warm and dry.     Capillary Refill: Capillary refill takes less than 2 seconds.  Neurological:     Mental Status: She is alert.     Left Hand Exam   Tenderness  Left hand tenderness location: TTP at thumb CMC joint and over STT articulation.   Other  Erythema: absent Sensation: normal Pulse: present  Comments:  Pain and crepitus with CMC grind test.  Mild, static MCP hyper-extension of <30 degrees.  No palmar abduction contracture of thumb.       Specialty Comments:  EXAM: MRI LUMBAR SPINE WITHOUT CONTRAST   TECHNIQUE: Multiplanar, multisequence MR imaging of the lumbar spine was performed. No intravenous contrast was administered.   COMPARISON:  12/23/2013   FINDINGS: Segmentation:  5 lumbar type vertebrae   Alignment:  Physiologic.   Vertebrae: No fracture, evidence of discitis, or aggressive bone lesion.   Conus medullaris and cauda equina: Conus extends to the T12-L1 level. Conus and cauda equina appear normal.   Paraspinal and other soft tissues: No  evidence of perispinal mass or inflammation   Disc levels:   Well preserved disc height and hydration for age. No advanced or progressive degenerative findings. Minor generalized disc bulging with L4-5 annular fissure. The canal and foramina are diffusely patent. Negative facets.   IMPRESSION: Stable lumbar spine degeneration that is less than typical for age. No neural impingement or visible inflammation.     Electronically Signed   By: Jorje Guild M.D.   On: 06/26/2021 10:20  Imaging: No results found.   PMFS History: Patient Active Problem List   Diagnosis Date Noted   Arthritis of carpometacarpal [facility]) joint of left thumb 05/15/2022   Arthritis of  scaphoid-trapezium-trapezoid joint of left hand 05/15/2022   Dyslipidemia 07/27/2019   Essential hypertension 07/27/2019   Upper GI bleed 07/27/2019   Diverticulitis 07/27/2019   Gastritis 07/27/2019   Melena 07/23/2019   Concussion with loss of consciousness 06/02/2014   Left-sided weakness 05/28/2014   Syncope and collapse 05/28/2014   Scalp contusion 05/28/2014   Hypertension    Past Medical History:  Diagnosis Date   Depression    Hypertension    IBS (irritable bowel syndrome)    Malignant melanoma ([place])     Family History  Problem Relation Age of Onset   Heart attack Mother    Hypertension Mother    Cancer Father    Diabetes Father    Hypertension Father    Breast cancer Paternal Grandfather     Past Surgical History:  Procedure Laterality Date   BLADDER REPAIR     ESOPHAGOGASTRODUODENOSCOPY (EGD) WITH PROPOFOL N/A 07/24/2019   Procedure: ESOPHAGOGASTRODUODENOSCOPY (EGD) WITH PROPOFOL;  Surgeon: Wilford Corner, MD;  Location: [facility];  Service: Endoscopy;  Laterality: N/A;   FOOT SURGERY Bilateral    HERNIA REPAIR     MELANOMA EXCISION     VAGINAL HYSTERECTOMY     Social History   Occupational History   Occupation: retired  Tobacco Use   Smoking status: Never   Smokeless tobacco: Never  Vaping Use   Vaping Use: Never used  Substance and Sexual Activity   Alcohol use: No   Drug use: No   Sexual activity: Not on file

## 2022-06-09 ENCOUNTER — Ambulatory Visit
Admission: RE | Admit: 2022-06-09 | Discharge: 2022-06-09 | Disposition: A | Discharge status: Home / Self Care | Payer: PPO | Source: Ambulatory Visit | Attending: Internal Medicine | Admitting: Internal Medicine

## 2022-06-09 DIAGNOSIS — Z1231 Encounter for screening mammogram for malignant neoplasm of breast: Secondary | ICD-10-CM

## 2022-08-06 ENCOUNTER — Encounter (HOSPITAL_COMMUNITY): Payer: Self-pay | Admitting: Emergency Medicine

## 2022-08-06 ENCOUNTER — Other Ambulatory Visit: Payer: Self-pay

## 2022-08-06 ENCOUNTER — Emergency Department (HOSPITAL_COMMUNITY): Payer: PPO

## 2022-08-06 ENCOUNTER — Emergency Department (HOSPITAL_COMMUNITY)
Admission: EM | Admit: 2022-08-06 | Discharge: 2022-08-06 | Disposition: A | Discharge status: Home / Self Care | Payer: PPO | Attending: Emergency Medicine | Admitting: Emergency Medicine

## 2022-08-06 DIAGNOSIS — R110 Nausea: Secondary | ICD-10-CM | POA: Diagnosis not present

## 2022-08-06 DIAGNOSIS — R8289 Other abnormal findings on cytological and histological examination of urine: Secondary | ICD-10-CM | POA: Diagnosis not present

## 2022-08-06 DIAGNOSIS — R1011 Right upper quadrant pain: Secondary | ICD-10-CM | POA: Diagnosis not present

## 2022-08-06 DIAGNOSIS — Z79899 Other long term (current) drug therapy: Secondary | ICD-10-CM | POA: Insufficient documentation

## 2022-08-06 DIAGNOSIS — I700 Atherosclerosis of aorta: Secondary | ICD-10-CM | POA: Diagnosis not present

## 2022-08-06 DIAGNOSIS — Z9049 Acquired absence of other specified parts of digestive tract: Secondary | ICD-10-CM | POA: Diagnosis not present

## 2022-08-06 DIAGNOSIS — R1012 Left upper quadrant pain: Secondary | ICD-10-CM | POA: Insufficient documentation

## 2022-08-06 DIAGNOSIS — R1031 Right lower quadrant pain: Secondary | ICD-10-CM | POA: Diagnosis not present

## 2022-08-06 DIAGNOSIS — R109 Unspecified abdominal pain: Secondary | ICD-10-CM | POA: Diagnosis not present

## 2022-08-06 DIAGNOSIS — R1013 Epigastric pain: Secondary | ICD-10-CM | POA: Diagnosis not present

## 2022-08-06 DIAGNOSIS — K760 Fatty (change of) liver, not elsewhere classified: Secondary | ICD-10-CM | POA: Diagnosis not present

## 2022-08-06 DIAGNOSIS — Z7982 Long term (current) use of aspirin: Secondary | ICD-10-CM | POA: Insufficient documentation

## 2022-08-06 DIAGNOSIS — R9431 Abnormal electrocardiogram [ECG] [EKG]: Secondary | ICD-10-CM | POA: Diagnosis not present

## 2022-08-06 DIAGNOSIS — R638 Other symptoms and signs concerning food and fluid intake: Secondary | ICD-10-CM | POA: Diagnosis not present

## 2022-08-06 LAB — COMPREHENSIVE METABOLIC PANEL
ALT: 65 U/L — ABNORMAL HIGH (ref 0–44)
AST: 39 U/L (ref 15–41)
Albumin: 4.1 g/dL (ref 3.5–5.0)
Alkaline Phosphatase: 76 U/L (ref 38–126)
Anion gap: 10 (ref 5–15)
BUN: 15 mg/dL (ref 8–23)
CO2: 27 mmol/L (ref 22–32)
Calcium: 9.2 mg/dL (ref 8.9–10.3)
Chloride: 102 mmol/L (ref 98–111)
Creatinine, Ser: 0.79 mg/dL (ref 0.44–1.00)
GFR, Estimated: >60 mL/min (ref >60)
Glucose, Bld: 108 mg/dL — ABNORMAL HIGH (ref 70–99)
Potassium: 3.3 mmol/L — ABNORMAL LOW (ref 3.5–5.1)
Sodium: 139 mmol/L (ref 135–145)
Total Bilirubin: 1.1 mg/dL (ref 0.3–1.2)
Total Protein: 7.2 g/dL (ref 6.5–8.1)

## 2022-08-06 LAB — CBC WITH DIFFERENTIAL/PLATELET
Abs Immature Granulocytes: 0.04 10*3/uL (ref 0.00–0.07)
Basophils Absolute: 0.1 10*3/uL (ref 0.0–0.1)
Basophils Relative: 1 %
Eosinophils Absolute: 0.3 10*3/uL (ref 0.0–0.5)
Eosinophils Relative: 3 %
HCT: 42.9 % (ref 36.0–46.0)
Hemoglobin: 14.6 g/dL (ref 12.0–15.0)
Immature Granulocytes: 0 %
Lymphocytes Relative: 36 %
Lymphs Abs: 3.2 10*3/uL (ref 0.7–4.0)
MCH: 31.5 pg (ref 26.0–34.0)
MCHC: 34 g/dL (ref 30.0–36.0)
MCV: 92.5 fL (ref 80.0–100.0)
Monocytes Absolute: 0.6 10*3/uL (ref 0.1–1.0)
Monocytes Relative: 7 %
Neutro Abs: 4.7 10*3/uL (ref 1.7–7.7)
Neutrophils Relative %: 53 %
Platelets: 259 10*3/uL (ref 150–400)
RBC: 4.64 MIL/uL (ref 3.87–5.11)
RDW: 13.1 % (ref 11.5–15.5)
WBC: 9 10*3/uL (ref 4.0–10.5)
nRBC: 0 % (ref 0.0–0.2)

## 2022-08-06 LAB — URINALYSIS, ROUTINE W REFLEX MICROSCOPIC
Bilirubin Urine: NEGATIVE
Glucose, UA: NEGATIVE mg/dL
Hgb urine dipstick: NEGATIVE
Ketones, ur: NEGATIVE mg/dL
Nitrite: POSITIVE — AB
Protein, ur: NEGATIVE mg/dL
Specific Gravity, Urine: 1.017 (ref 1.005–1.030)
pH: 6 (ref 5.0–8.0)

## 2022-08-06 LAB — LIPASE, BLOOD: Lipase: 34 U/L (ref 11–51)

## 2022-08-06 LAB — TROPONIN I (HIGH SENSITIVITY): Troponin I (High Sensitivity): 6 ng/L (ref <18)

## 2022-08-06 MED ORDER — FENTANYL CITRATE PF 50 MCG/ML IJ SOSY
50.0000 ug | PREFILLED_SYRINGE | Freq: Once | INTRAMUSCULAR | Status: AC
  Administered 2022-08-06: 50 ug via INTRAVENOUS
  Filled 2022-08-06: qty 1

## 2022-08-06 MED ORDER — ONDANSETRON HCL 4 MG/2ML IJ SOLN
4.0000 mg | Freq: Once | INTRAMUSCULAR | Status: AC
  Administered 2022-08-06: 4 mg via INTRAVENOUS
  Filled 2022-08-06: qty 2

## 2022-08-06 MED ORDER — MORPHINE SULFATE (PF) 4 MG/ML IV SOLN
4.0000 mg | Freq: Once | INTRAVENOUS | Status: DC
  Filled 2022-08-06: qty 1

## 2022-08-06 MED ORDER — LIDOCAINE VISCOUS HCL 2 % MT SOLN: 10.0000 mL | Freq: Four times a day (QID) | OROMUCOSAL | 1 refills | Status: DC | PRN

## 2022-08-06 MED ORDER — FENTANYL CITRATE PF 50 MCG/ML IJ SOSY: 50.0000 ug | PREFILLED_SYRINGE | Freq: Once | INTRAMUSCULAR | Status: DC

## 2022-08-06 MED ORDER — ALUM & MAG HYDROXIDE-SIMETH 200-200-20 MG/5ML PO SUSP
30.0000 mL | Freq: Once | ORAL | Status: AC
  Administered 2022-08-06: 30 mL via ORAL
  Filled 2022-08-06: qty 30

## 2022-08-06 MED ORDER — FENTANYL CITRATE PF 50 MCG/ML IJ SOSY
50.0000 ug | PREFILLED_SYRINGE | Freq: Once | INTRAMUSCULAR | Status: AC
  Administered 2022-08-06: 50 ug via NASAL
  Filled 2022-08-06: qty 1

## 2022-08-06 MED ORDER — PANTOPRAZOLE SODIUM 40 MG PO TBEC: 40.0000 mg | DELAYED_RELEASE_TABLET | Freq: Every day | ORAL | 1 refills | Status: DC

## 2022-08-06 MED ORDER — SUCRALFATE 1 G PO TABS: 1.0000 g | ORAL_TABLET | Freq: Three times a day (TID) | ORAL | 1 refills | Status: DC

## 2022-08-06 MED ORDER — LIDOCAINE VISCOUS HCL 2 % MT SOLN
15.0000 mL | Freq: Once | OROMUCOSAL | Status: AC
  Administered 2022-08-06: 15 mL via ORAL
  Filled 2022-08-06: qty 15

## 2022-08-06 MED ORDER — IOHEXOL 300 MG/ML  SOLN
100.0000 mL | Freq: Once | INTRAMUSCULAR | Status: AC | PRN
  Administered 2022-08-06: 100 mL via INTRAVENOUS

## 2022-08-06 NOTE — Discharge Instructions (Signed)
Contact a health care provider if: Your symptoms do not improve within 7 days of starting treatment. You have ongoing indigestion or heartburn. Get help right away if: You have sudden, sharp, or persistent pain in your abdomen. You have bloody or dark black, tarry stools. You vomit blood or material that looks like coffee grounds. You become light-headed or you feel faint. You become weak. You become sweaty or clammy. These symptoms may be an emergency. Get help right away. Call 911. Do not wait to see if the symptoms will go away. Do not drive yourself to the hospital.

## 2022-08-06 NOTE — ED Provider Triage Note (Signed)
Emergency Medicine Provider Triage Evaluation Note  [name] , a 76 y.o. female  was evaluated in triage.  Pt complains of right upper quadrant abdominal pain.  Patient states that symptoms been present for the past 3 days.  Reports history of cholecystectomy as well as hiatal hernia surgery.  Patient states the pain is worsened with consumption of food and there are no relieving factors.  Denies fever.  Associated symptoms include nausea.  Reports chronic history of diarrhea for the past several years.  Sent by PCP for further evaluation..  Review of Systems  Positive: Blood Negative:   Physical Exam  BP (!) 156/94   Pulse 61   Temp 98 F (36.7 C) (Oral)   Resp 18   Ht '5\' 4"'$  (1.626 m)   Wt 90.3 kg   SpO2 99%   BMI 34.16 kg/m  Gen:   Awake, no distress   Resp:  Normal effort  MSK:   Moves extremities without difficulty  Other:  Right upper quadrant abdominal tenderness.  Medical Decision Making  Medically screening exam initiated at 2:38 PM.  Appropriate orders placed.  DNIYA NEUHAUS was informed that the remainder of the evaluation will be completed by another provider, this initial triage assessment does not replace that evaluation, and the importance of remaining in the ED until their evaluation is complete.     Wilnette Kales, [place] 08/06/22 1439

## 2022-08-06 NOTE — ED Provider Notes (Signed)
[facility] DEPT Provider Note   CSN: [id number] Arrival date & time: 08/06/22  1356     History {Add pertinent medical, surgical, social history, OB history to HPI:1} Chief Complaint  Patient presents with  . Abdominal Pain    [name] is a 76 y.o. female who presents emergency department chief complaint of epigastric and right upper quadrant abdominal pain.  This is ongoing for the past 4 days.  It is constant, waxing and waning, severe, worsened by eating.  Nothing seems to make it better.  She is status postcholecystectomy.  She has a history of previous episodes of diverticulitis.  She has had nausea without vomiting, she denies diarrhea or constipation, chest pain or shortness of breath.   Abdominal Pain      Home Medications Prior to Admission medications   Medication Sig Start Date End Date Taking? Authorizing Provider  Acetaminophen (TYLENOL PO) Take 2 tablets by mouth daily as needed (for pain or headache).    [provider]  ALPRAZolam Duanne Moron) 1 MG tablet Take 2 mg by mouth 2 (two) times daily as needed for anxiety.  05/16/14   [provider]  aspirin EC 81 MG tablet Take 81 mg by mouth at bedtime.    [provider]  baclofen (LIORESAL) 10 MG tablet Take 1 tablet (10 mg total) by mouth 3 (three) times daily as needed for muscle spasms. 12/16/19   Hilts, Legrand Como, MD  cetirizine (ZYRTEC) 10 MG tablet Take 10 mg by mouth daily.    [provider]  Cholecalciferol (VITAMIN D3 PO) Take by mouth daily.    [provider]  conjugated estrogens (PREMARIN) vaginal cream Place 1 Applicatorful vaginally once a week.    [provider]  cyclobenzaprine (FLEXERIL) 10 MG tablet Take 1 tablet (10 mg total) by mouth 2 (two) times daily as needed for muscle spasms. 01/06/22   Lorine Bears, NP  gabapentin (NEURONTIN) 300 MG capsule 1 PO q HS, may increase to 1 PO TID if needed 12/16/19   Hilts,  Michael, MD  hydrochlorothiazide (HYDRODIURIL) 25 MG tablet Take 25 mg by mouth daily. 05/11/14   [provider]  Ketotifen Fumarate (ALAWAY OP) Place 5 drops into both eyes daily as needed (for allergies).    [provider]  levothyroxine (SYNTHROID) 50 MCG tablet Take 50 mcg by mouth daily before breakfast.  02/13/19   [provider]  meloxicam (MOBIC) 15 MG tablet Take 15 mg by mouth daily as needed for pain.  02/15/19   [provider]  methylPREDNISolone (MEDROL DOSEPAK) 4 MG TBPK tablet As directed for 6 days. 12/16/19   Hilts, Legrand Como, MD  metoprolol tartrate (LOPRESSOR) 50 MG tablet Take 50 mg by mouth 2 (two) times daily.  05/11/14   [provider]  montelukast (SINGULAIR) 10 MG tablet Take 1 tablet by mouth daily. 02/03/19   [provider]  PROAIR HFA 108 (90 Base) MCG/ACT inhaler Inhale 2 puffs into the lungs every 4 (four) hours as needed. 02/04/19   [provider]  simvastatin (ZOCOR) 40 MG tablet Take 40 mg by mouth daily.    [provider]  traMADol (ULTRAM) 50 MG tablet Take 1 tablet (50 mg total) by mouth every 12 (twelve) hours as needed. 06/19/21   Magnant, Charles L, PA-C  zolpidem (AMBIEN) 10 MG tablet Take 10 mg by mouth at bedtime as needed for sleep.  03/22/19   [provider]  Allergies    Codeine, Penicillins, Lisinopril, Flagyl [metronidazole], and Sulfa antibiotics    Review of Systems   Review of Systems  Gastrointestinal:  Positive for abdominal pain.    Physical Exam Updated Vital Signs BP (!) 201/74   Pulse 63   Temp 98.4 F (36.9 C) (Oral)   Resp 16   Ht '5\' 4"'$  (1.626 m)   Wt 90.3 kg   SpO2 100%   BMI 34.16 kg/m  Physical Exam Vitals and nursing note reviewed.  Constitutional:      General: She is not in acute distress.    Appearance: She is well-developed. She is not diaphoretic.  HENT:     Head: Normocephalic and atraumatic.     Right Ear: External ear normal.      Left Ear: External ear normal.     Nose: Nose normal.     Mouth/Throat:     Mouth: Mucous membranes are moist.  Eyes:     General: No scleral icterus.    Conjunctiva/sclera: Conjunctivae normal.  Cardiovascular:     Rate and Rhythm: Normal rate and regular rhythm.     Heart sounds: Normal heart sounds. No murmur heard.    No friction rub. No gallop.  Pulmonary:     Effort: Pulmonary effort is normal. No respiratory distress.     Breath sounds: Normal breath sounds.  Abdominal:     General: Bowel sounds are normal. There is no distension.     Palpations: Abdomen is soft. There is no mass.     Tenderness: There is abdominal tenderness in the right upper quadrant, epigastric area and left upper quadrant. There is no guarding.  Musculoskeletal:     Cervical back: Normal range of motion.  Skin:    General: Skin is warm and dry.  Neurological:     Mental Status: She is alert and oriented to person, place, and time.  Psychiatric:        Behavior: Behavior normal.     ED Results / Procedures / Treatments   Labs (all labs ordered are listed, but only abnormal results are displayed) Labs Reviewed  COMPREHENSIVE METABOLIC PANEL - Abnormal; Notable for the following components:      Result Value   Potassium 3.3 (*)    Glucose, Bld 108 (*)    ALT 65 (*)    All other components within normal limits  URINALYSIS, ROUTINE W REFLEX MICROSCOPIC - Abnormal; Notable for the following components:   APPearance HAZY (*)    Nitrite POSITIVE (*)    Leukocytes,Ua SMALL (*)    Bacteria, UA MANY (*)    All other components within normal limits  CBC WITH DIFFERENTIAL/PLATELET  LIPASE, BLOOD  TROPONIN I (HIGH SENSITIVITY)    EKG None  Radiology US Abdomen Limited RUQ (LIVER/GB)  Result Date: 08/06/2022 CLINICAL DATA:  RIGHT upper quadrant pain.  Prior cholecystectomy EXAM: ULTRASOUND ABDOMEN LIMITED RIGHT UPPER QUADRANT COMPARISON:  None Available. FINDINGS: Gallbladder: Surgically absent  Common bile duct: Diameter: Normal 4 mm Liver: Uniform increase in hepatic echogenicity. No duct dilatation focal lesion. Portal vein is patent on color Doppler imaging with normal direction of blood flow towards the liver. Other: None. IMPRESSION: 1. No biliary duct dilatation.  Postcholecystectomy. 2. Increased hepatic echogenicity commonly represents hepatic steatosis. Electronically Signed   By: Suzy Bouchard M.D.   On: 08/06/2022 17:52    Procedures Procedures  {Document cardiac monitor, telemetry assessment procedure when appropriate:1}  Medications Ordered in ED Medications  iohexol (OMNIPAQUE) 300  MG/ML solution 100 mL (has no administration in time range)  fentaNYL (SUBLIMAZE) injection 50 mcg (50 mcg Intravenous Given 08/06/22 1938)  ondansetron (ZOFRAN) injection 4 mg (4 mg Intravenous Given 08/06/22 1938)    ED Course/ Medical Decision Making/ A&P Clinical Course as of 08/06/22 2011  Wed Aug 06, 2022  2009 EKG 12-Lead Nsr rate of 63 [AH]  2010 Bacteria, UA(!): MANY [AH]  2010 Chalmers GuestMarland Kitchen): SMALL [AH]  2010 Nitrite(!): POSITIVE [AH]  2011 Appearance(!): HAZY [AH]  2011 Urinalysis, Routine w reflex microscopic(!) Patient denies Urinary sxs or flank pain [AH]    Clinical Course User Index [AH] Margarita Mail, PA-C                           Medical Decision Making Amount and/or Complexity of Data Reviewed Radiology: ordered. ECG/medicine tests: ordered.  Risk Prescription drug management.   ***  {Document critical care time when appropriate:1} {Document review of labs and clinical decision tools ie heart score, Chads2Vasc2 etc:1}  {Document your independent review of radiology images, and any outside records:1} {Document your discussion with family members, caretakers, and with consultants:1} {Document social determinants of health affecting pt's care:1} {Document your decision making why or why not admission, treatments were needed:1} Final Clinical  Impression(s) / ED Diagnoses Final diagnoses:  None    Rx / DC Orders ED Discharge Orders     None

## 2022-08-06 NOTE — ED Triage Notes (Signed)
Pt to er, pt states that she is here for some RUQ pain, states that it started about 4 days ago, states that the pain is worse after she eats.  States that she was sent from her pmd for further eval, states that she has had her gallbladder in the past

## 2022-08-12 DIAGNOSIS — R110 Nausea: Secondary | ICD-10-CM | POA: Diagnosis not present

## 2022-08-12 DIAGNOSIS — K3189 Other diseases of stomach and duodenum: Secondary | ICD-10-CM | POA: Diagnosis not present

## 2022-08-12 DIAGNOSIS — R1011 Right upper quadrant pain: Secondary | ICD-10-CM | POA: Diagnosis not present

## 2022-08-12 DIAGNOSIS — K319 Disease of stomach and duodenum, unspecified: Secondary | ICD-10-CM | POA: Diagnosis not present

## 2022-08-12 DIAGNOSIS — K229 Disease of esophagus, unspecified: Secondary | ICD-10-CM | POA: Diagnosis not present

## 2022-08-12 DIAGNOSIS — Z980 Intestinal bypass and anastomosis status: Secondary | ICD-10-CM | POA: Diagnosis not present

## 2022-08-15 DIAGNOSIS — K319 Disease of stomach and duodenum, unspecified: Secondary | ICD-10-CM | POA: Diagnosis not present

## 2022-08-24 ENCOUNTER — Telehealth: Payer: Self-pay | Admitting: *Deleted

## 2022-08-24 ENCOUNTER — Ambulatory Visit (INDEPENDENT_AMBULATORY_CARE_PROVIDER_SITE_OTHER): Payer: PPO

## 2022-08-24 ENCOUNTER — Encounter: Payer: Self-pay | Admitting: *Deleted

## 2022-08-24 ENCOUNTER — Other Ambulatory Visit: Payer: Self-pay

## 2022-08-24 ENCOUNTER — Ambulatory Visit
Admission: EM | Admit: 2022-08-24 | Discharge: 2022-08-24 | Disposition: A | Discharge status: Home / Self Care | Payer: PPO | Attending: Nurse Practitioner | Admitting: Nurse Practitioner

## 2022-08-24 DIAGNOSIS — R059 Cough, unspecified: Secondary | ICD-10-CM | POA: Insufficient documentation

## 2022-08-24 DIAGNOSIS — J189 Pneumonia, unspecified organism: Secondary | ICD-10-CM | POA: Diagnosis not present

## 2022-08-24 DIAGNOSIS — R0602 Shortness of breath: Secondary | ICD-10-CM | POA: Diagnosis not present

## 2022-08-24 DIAGNOSIS — Z1152 Encounter for screening for COVID-19: Secondary | ICD-10-CM | POA: Insufficient documentation

## 2022-08-24 MED ORDER — ALBUTEROL SULFATE (2.5 MG/3ML) 0.083% IN NEBU: 2.5000 mg | INHALATION_SOLUTION | Freq: Four times a day (QID) | RESPIRATORY_TRACT | 2 refills | Status: DC | PRN

## 2022-08-24 MED ORDER — ALBUTEROL SULFATE (2.5 MG/3ML) 0.083% IN NEBU
2.5000 mg | INHALATION_SOLUTION | RESPIRATORY_TRACT | Status: AC
  Administered 2022-08-24: 2.5 mg via RESPIRATORY_TRACT

## 2022-08-24 MED ORDER — PROMETHAZINE-DM 6.25-15 MG/5ML PO SYRP: 5.0000 mL | ORAL_SOLUTION | Freq: Four times a day (QID) | ORAL | 0 refills | Status: DC | PRN

## 2022-08-24 MED ORDER — LEVOFLOXACIN 750 MG PO TABS: 750.0000 mg | ORAL_TABLET | Freq: Every day | ORAL | 0 refills | Status: DC

## 2022-08-24 MED ORDER — METHYLPREDNISOLONE SODIUM SUCC 125 MG IJ SOLR
125.0000 mg | INTRAMUSCULAR | Status: AC
  Administered 2022-08-24: 125 mg via INTRAMUSCULAR

## 2022-08-24 MED ORDER — LEVOFLOXACIN 750 MG PO TABS: 750.0000 mg | ORAL_TABLET | Freq: Every day | ORAL | 0 refills | Status: AC

## 2022-08-24 MED ORDER — PREDNISONE 20 MG PO TABS: 40.0000 mg | ORAL_TABLET | Freq: Every day | ORAL | 0 refills | Status: DC

## 2022-08-24 MED ORDER — PREDNISONE 20 MG PO TABS: 40.0000 mg | ORAL_TABLET | Freq: Every day | ORAL | 0 refills | Status: AC

## 2022-08-24 NOTE — ED Triage Notes (Addendum)
C/O starting with nasal drip 2 days ago; started using Flonase and azelastin, along with Afrin at night. STarted with dry cough and fatigue yesterday; cough becoming much more significant. C/O fevers up to 102.1; has been taking Tyl (last dose yesterday). Has been using incentive spirometer - feels she is unable to reach target level today. Reports negative home Covid tests both yesterday and today.

## 2022-08-24 NOTE — ED Notes (Signed)
Home nebulizer provided to pt.

## 2022-08-24 NOTE — ED Provider Notes (Signed)
[facility]    CSN: [id number] Arrival date & time: 08/24/22  1326      History   Chief Complaint Chief Complaint  Patient presents with   Cough   Fatigue    HPI [name] is a 76 y.o. female.   The history is provided by the patient.   Patient presents with a 2-day history of persistent cough and a several day history of postnasal drainage.  Patient states over the past 48 hours, her cough has changed from being productive to dry.  She states that initially she thought the cough was higher up, coming from her throat,, now she feels it is in her chest.  She states that she does have some shortness of breath.  She states she has been using an incentive spirometer at home and states that she cannot get the ball to move as much as she would like to.  She states she has been using Flonase and Astelin at night.  She also reports that she has been taking her Singulair for her asthma and allergies.  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 05/15/2022   Dyslipidemia 07/27/2019   Essential hypertension 07/27/2019   Upper GI bleed 07/27/2019   Diverticulitis 07/27/2019   Gastritis 07/27/2019   Melena 07/23/2019   Concussion with loss of consciousness 06/02/2014   Left-sided weakness 05/28/2014   Syncope and collapse 05/28/2014   Scalp contusion 05/28/2014   Hypertension     Past Surgical History:  Procedure Laterality Date   BLADDER REPAIR     ESOPHAGOGASTRODUODENOSCOPY (EGD) WITH PROPOFOL N/A 07/24/2019   Procedure: ESOPHAGOGASTRODUODENOSCOPY (EGD) WITH PROPOFOL;  Surgeon: Wilford Corner, MD;  Location: [facility];  Service: Endoscopy;  Laterality: N/A;   FOOT SURGERY Bilateral     HERNIA REPAIR     MELANOMA EXCISION     VAGINAL HYSTERECTOMY      OB History   No obstetric history on file.      Home Medications    Prior to Admission medications   Medication Sig Start Date End Date Taking? Authorizing Provider  Acetaminophen (TYLENOL PO) Take 2 tablets by mouth daily as needed (for pain or headache).   Yes [provider]  albuterol (PROVENTIL) (2.5 MG/3ML) 0.083% nebulizer solution Take 3 mLs (2.5 mg total) by nebulization every 6 (six) hours as needed for wheezing or shortness of breath. 08/24/22  Yes My Rinke-Warren, Alda Lea, NP  ALPRAZolam Duanne Moron) 1 MG tablet Take 2 mg by mouth 2 (two) times daily as needed for anxiety.  05/16/14  Yes [provider]  AMLODIPINE BESYLATE PO Take by mouth.   Yes [provider]  cetirizine (ZYRTEC) 10 MG tablet Take 10 mg by mouth daily.   Yes [provider]  Cholecalciferol (VITAMIN D3 PO) Take by mouth daily.   Yes [provider]  hydrochlorothiazide (HYDRODIURIL) 25 MG tablet Take 25 mg by mouth daily. 05/11/14  Yes [provider]  levofloxacin (LEVAQUIN) 750 MG tablet Take 1 tablet (750 mg total) by mouth daily for 5 days. 08/24/22 08/29/22 Yes Madgie Dhaliwal-Warren, Alda Lea, NP  levothyroxine (SYNTHROID) 50 MCG tablet Take 50 mcg by mouth daily before breakfast.  02/13/19  Yes  [provider]  metoprolol tartrate (LOPRESSOR) 50 MG tablet Take 25 mg by mouth 2 (two) times daily. 05/11/14  Yes [provider]  montelukast (SINGULAIR) 10 MG tablet Take 1 tablet by mouth daily. 02/03/19  Yes [provider]  predniSONE (DELTASONE) 20 MG tablet Take 2 tablets (40 mg total) by mouth daily with breakfast for 5 days. 08/24/22 08/29/22 Yes Dilcia Rybarczyk-Warren, Alda Lea, NP  promethazine-dextromethorphan (PROMETHAZINE-DM) 6.25-15 MG/5ML syrup Take 5 mLs by mouth 4 (four) times daily as needed for cough. 08/24/22  Yes Roosevelt Eimers-Warren, Alda Lea, NP  simvastatin (ZOCOR) 40  MG tablet Take 40 mg by mouth daily.   Yes [provider]  zolpidem (AMBIEN) 10 MG tablet Take 10 mg by mouth at bedtime as needed for sleep.  03/22/19  Yes [provider]  aspirin EC 81 MG tablet Take 81 mg by mouth at bedtime.    [provider]  baclofen (LIORESAL) 10 MG tablet Take 1 tablet (10 mg total) by mouth 3 (three) times daily as needed for muscle spasms. 12/16/19   Hilts, Legrand Como, MD  conjugated estrogens (PREMARIN) vaginal cream Place 1 Applicatorful vaginally once a week.    [provider]  cyclobenzaprine (FLEXERIL) 10 MG tablet Take 1 tablet (10 mg total) by mouth 2 (two) times daily as needed for muscle spasms. 01/06/22   Lorine Bears, NP  gabapentin (NEURONTIN) 300 MG capsule 1 PO q HS, may increase to 1 PO TID if needed 12/16/19   Hilts, Michael, MD  Ketotifen Fumarate (ALAWAY OP) Place 5 drops into both eyes daily as needed (for allergies).    [provider]  lidocaine (XYLOCAINE) 2 % solution Use as directed 10 mLs in the mouth or throat every 6 (six) hours as needed (Stomach pain). 08/06/22   Margarita Mail, PA-C  meloxicam (MOBIC) 15 MG tablet Take 15 mg by mouth daily as needed for pain.  02/15/19   [provider]  methylPREDNISolone (MEDROL DOSEPAK) 4 MG TBPK tablet As directed for 6 days. 12/16/19   Hilts, Legrand Como, MD  pantoprazole (PROTONIX) 40 MG tablet Take 1 tablet (40 mg total) by mouth daily. 08/06/22   Harris, Vernie Shanks, PA-C  sucralfate (CARAFATE) 1 g tablet Take 1 tablet (1 g total) by mouth 4 (four) times daily -  with meals and at bedtime. 08/06/22   Harris, Vernie Shanks, PA-C  traMADol (ULTRAM) 50 MG tablet Take 1 tablet (50 mg total) by mouth every 12 (twelve) hours as needed. 06/19/21   Magnant, Gerrianne Scale, PA-C    Family History Family History  Problem Relation Age of Onset   Heart attack Mother    Hypertension Mother    Cancer Father    Diabetes Father    Hypertension Father    Breast cancer Paternal  Grandfather     Social History Social History   Tobacco Use   Smoking status: Never   Smokeless tobacco: Never  Vaping Use   Vaping Use: Never used  Substance Use Topics   Alcohol use: No   Drug use: No     Allergies   Codeine, Penicillins, Lisinopril, Flagyl [metronidazole], and Sulfa antibiotics   Review of Systems Review of Systems Per HPI  Physical Exam Triage Vital Signs ED Triage Vitals  Enc Vitals Group     BP 08/24/22 1458 117/76     Pulse Rate 08/24/22 1458 74     Resp 08/24/22 1458 18     Temp 08/24/22 1458 98.7 F (37.1 C)  Temp Source 08/24/22 1458 Oral     SpO2 08/24/22 1458 96 %     Weight --      Height --      Head Circumference --      Peak Flow --      Pain Score 08/24/22 1501 0     Pain Loc --      Pain Edu? --      Excl. in [place]? --    No data found.  Updated Vital Signs BP 117/76   Pulse 74   Temp 98.7 F (37.1 C) (Oral)   Resp 18   SpO2 96%   Visual Acuity Right Eye Distance:   Left Eye Distance:   Bilateral Distance:    Right Eye Near:   Left Eye Near:    Bilateral Near:     Physical Exam Vitals and nursing note reviewed.  Constitutional:      General: She is not in acute distress.    Appearance: Normal appearance.  HENT:     Head: Normocephalic.     Right Ear: Tympanic membrane, ear canal and external ear normal.     Left Ear: Tympanic membrane, ear canal and external ear normal.     Nose: Nose normal.  Eyes:     Extraocular Movements: Extraocular movements intact.     Conjunctiva/sclera: Conjunctivae normal.     Pupils: Pupils are equal, round, and reactive to light.  Cardiovascular:     Rate and Rhythm: Normal rate and regular rhythm.     Pulses: Normal pulses.     Heart sounds: Normal heart sounds.  Pulmonary:     Effort: Pulmonary effort is normal.     Breath sounds: Normal breath sounds.     Comments: Persistent cough noted during exam Abdominal:     General: Bowel sounds are normal.     Palpations:  Abdomen is soft.  Musculoskeletal:     Cervical back: Normal range of motion.  Skin:    General: Skin is warm and dry.  Neurological:     General: No focal deficit present.     Mental Status: She is alert and oriented to person, place, and time.  Psychiatric:        Mood and Affect: Mood normal.        Behavior: Behavior normal.      UC Treatments / Results  Labs (all labs ordered are listed, but only abnormal results are displayed) Labs Reviewed  RESP PANEL BY RT-PCR (FLU A&B, COVID) [id number]    EKG   Radiology DG Chest 2 View  Result Date: 08/24/2022 CLINICAL DATA:  Cough and shortness of breath EXAM: CHEST - 2 VIEW COMPARISON:  Chest x-ray November 22, 2020 FINDINGS: The cardiomediastinal silhouette is unchanged in contour. Possible right middle lobe opacity, best seen on the lateral view. The left lung is clear. No pleural effusion or pneumothorax. Surgical clips visualized in the upper mid abdomen and right upper quadrant. No acute osseous abnormality. IMPRESSION: Possible right middle lobe opacity. Differential considerations include atelectasis or infection. Electronically Signed   By: Beryle Flock M.D.   On: 08/24/2022 15:35    Procedures Procedures (including critical care time)  Medications Ordered in UC Medications  albuterol (PROVENTIL) (2.5 MG/3ML) 0.083% nebulizer solution 2.5 mg (2.5 mg Nebulization Given 08/24/22 1534)  methylPREDNISolone sodium succinate (SOLU-MEDROL) 125 mg/2 mL injection 125 mg (125 mg Intramuscular Given 08/24/22 1529)    Initial Impression / Assessment and Plan / UC Course  I  have reviewed the triage vital signs and the nursing notes.  Pertinent labs & imaging results that were available during my care of the patient were reviewed by me and considered in my medical decision making (see chart for details).  Patient presents with a 2-day history of cough.  On exam, patient appears to be in distress due to the excessive coughing.  No  audible wheezing or crackles heard on exam.  Patient was given albuterol nebulizer treatment and Solu-Medrol 120 mg in the clinic.  Post nebulizer treatment, patient reports improvement of her breathing.  Cough has also decreased.  Chest x-ray confirms a right middle lobe opacity, consistent with possible pneumonia.  Will treat patient with Levaquin 750 mg.  For the persistent coughing, patient was prescribed prednisone 40 mg, and albuterol nebulizer machine was also sent home with the patient to begin use of albuterol nebulizer treatments, and Promethazine DM for her cough.  Patient was given supportive care recommendations for home.  Patient was also given strict indications of when to go to the emergency department.  Patient is scheduled to follow-up with her primary care physician in 1 week.  Patient verbalizes understanding.  All questions were answered.  Patient is stable for discharge. Final Clinical Impressions(s) / UC Diagnoses   Final diagnoses:  Pneumonia of right middle lobe due to infectious organism  Cough, unspecified type  Encounter for screening for COVID-19     Discharge Instructions      The x-ray suggest that you have a right middle lobe pneumonia. COVID/flu test is pending.  You will be contacted if the results of the test are positive.  If you choose, you are a candidate to receive molnupiravir as an antiviral therapy. Increase fluids and allow for plenty of rest. May take Tylenol as needed for fever or pain. Recommend using a humidifier in your bedroom at nighttime during sleep to help with cough.  Also recommend sleeping elevated on 2 pillows while cough symptoms persist. As discussed, please follow-up with your primary care physician within the next 7 to 10 days for reevaluation. Go to the emergency department immediately if you experience worsening cough, shortness of breath, difficulty breathing, or other concerns. Follow-up as needed.      ED Prescriptions      Medication Sig Dispense Auth. Provider   levofloxacin (LEVAQUIN) 750 MG tablet Take 1 tablet (750 mg total) by mouth daily for 5 days. 5 tablet Chrystian Cupples-Warren, Alda Lea, NP   predniSONE (DELTASONE) 20 MG tablet Take 2 tablets (40 mg total) by mouth daily with breakfast for 5 days. 10 tablet Rafan Sanders-Warren, Alda Lea, NP   promethazine-dextromethorphan (PROMETHAZINE-DM) 6.25-15 MG/5ML syrup Take 5 mLs by mouth 4 (four) times daily as needed for cough. 140 mL Sharbel Sahagun-Warren, Alda Lea, NP   albuterol (PROVENTIL) (2.5 MG/3ML) 0.083% nebulizer solution Take 3 mLs (2.5 mg total) by nebulization every 6 (six) hours as needed for wheezing or shortness of breath. 75 mL Natalio Salois-Warren, Alda Lea, NP      PDMP not reviewed this encounter.   Tish Men, NP 08/24/22 1558

## 2022-08-24 NOTE — Discharge Instructions (Addendum)
The x-ray suggest that you have a right middle lobe pneumonia. COVID/flu test is pending.  You will be contacted if the results of the test are positive.  If you choose, you are a candidate to receive molnupiravir as an antiviral therapy. Increase fluids and allow for plenty of rest. May take Tylenol as needed for fever or pain. Recommend using a humidifier in your bedroom at nighttime during sleep to help with cough.  Also recommend sleeping elevated on 2 pillows while cough symptoms persist. As discussed, please follow-up with your primary care physician within the next 7 to 10 days for reevaluation. Go to the emergency department immediately if you experience worsening cough, shortness of breath, difficulty breathing, or other concerns. Follow-up as needed.

## 2022-08-25 LAB — RESP PANEL BY RT-PCR (FLU A&B, COVID) ARPGX2
Influenza A by PCR: NEGATIVE
Influenza B by PCR: NEGATIVE
SARS Coronavirus 2 by RT PCR: NEGATIVE

## 2022-09-01 ENCOUNTER — Other Ambulatory Visit: Payer: Self-pay | Admitting: Internal Medicine

## 2022-09-01 ENCOUNTER — Ambulatory Visit
Admission: RE | Admit: 2022-09-01 | Discharge: 2022-09-01 | Disposition: A | Discharge status: Home / Self Care | Payer: PPO | Source: Ambulatory Visit | Attending: Internal Medicine | Admitting: Internal Medicine

## 2022-09-01 DIAGNOSIS — K921 Melena: Secondary | ICD-10-CM | POA: Diagnosis not present

## 2022-09-01 DIAGNOSIS — E039 Hypothyroidism, unspecified: Secondary | ICD-10-CM | POA: Diagnosis not present

## 2022-09-01 DIAGNOSIS — T3695XA Adverse effect of unspecified systemic antibiotic, initial encounter: Secondary | ICD-10-CM | POA: Diagnosis not present

## 2022-09-01 DIAGNOSIS — I10 Essential (primary) hypertension: Secondary | ICD-10-CM | POA: Diagnosis not present

## 2022-09-01 DIAGNOSIS — Z8719 Personal history of other diseases of the digestive system: Secondary | ICD-10-CM | POA: Diagnosis not present

## 2022-09-01 DIAGNOSIS — J189 Pneumonia, unspecified organism: Secondary | ICD-10-CM

## 2022-09-01 DIAGNOSIS — M47814 Spondylosis without myelopathy or radiculopathy, thoracic region: Secondary | ICD-10-CM | POA: Diagnosis not present

## 2022-09-01 DIAGNOSIS — R918 Other nonspecific abnormal finding of lung field: Secondary | ICD-10-CM | POA: Diagnosis not present

## 2022-09-01 DIAGNOSIS — Z8701 Personal history of pneumonia (recurrent): Secondary | ICD-10-CM | POA: Diagnosis not present

## 2022-09-01 DIAGNOSIS — J984 Other disorders of lung: Secondary | ICD-10-CM | POA: Diagnosis not present

## 2022-09-01 DIAGNOSIS — I493 Ventricular premature depolarization: Secondary | ICD-10-CM | POA: Diagnosis not present

## 2022-09-01 DIAGNOSIS — K521 Toxic gastroenteritis and colitis: Secondary | ICD-10-CM | POA: Diagnosis not present

## 2022-09-01 DIAGNOSIS — R062 Wheezing: Secondary | ICD-10-CM | POA: Diagnosis not present

## 2022-09-01 DIAGNOSIS — R5383 Other fatigue: Secondary | ICD-10-CM | POA: Diagnosis not present

## 2022-09-01 DIAGNOSIS — R399 Unspecified symptoms and signs involving the genitourinary system: Secondary | ICD-10-CM | POA: Diagnosis not present

## 2022-09-01 DIAGNOSIS — R0602 Shortness of breath: Secondary | ICD-10-CM | POA: Diagnosis not present

## 2022-09-18 DIAGNOSIS — Z961 Presence of intraocular lens: Secondary | ICD-10-CM | POA: Diagnosis not present

## 2022-09-18 DIAGNOSIS — H40013 Open angle with borderline findings, low risk, bilateral: Secondary | ICD-10-CM | POA: Diagnosis not present

## 2022-09-18 DIAGNOSIS — H04123 Dry eye syndrome of bilateral lacrimal glands: Secondary | ICD-10-CM | POA: Diagnosis not present

## 2022-09-18 DIAGNOSIS — H33302 Unspecified retinal break, left eye: Secondary | ICD-10-CM | POA: Diagnosis not present

## 2022-09-18 DIAGNOSIS — Z9889 Other specified postprocedural states: Secondary | ICD-10-CM | POA: Diagnosis not present

## 2022-09-18 DIAGNOSIS — H31092 Other chorioretinal scars, left eye: Secondary | ICD-10-CM | POA: Diagnosis not present

## 2022-09-18 DIAGNOSIS — H43812 Vitreous degeneration, left eye: Secondary | ICD-10-CM | POA: Diagnosis not present

## 2022-09-18 DIAGNOSIS — H26493 Other secondary cataract, bilateral: Secondary | ICD-10-CM | POA: Diagnosis not present

## 2022-09-18 DIAGNOSIS — H5703 Miosis: Secondary | ICD-10-CM | POA: Diagnosis not present

## 2022-09-18 DIAGNOSIS — H2589 Other age-related cataract: Secondary | ICD-10-CM | POA: Diagnosis not present

## 2022-10-28 ENCOUNTER — Encounter (HOSPITAL_COMMUNITY): Payer: Self-pay

## 2022-10-28 ENCOUNTER — Encounter (HOSPITAL_BASED_OUTPATIENT_CLINIC_OR_DEPARTMENT_OTHER): Payer: Self-pay

## 2022-10-28 ENCOUNTER — Observation Stay (HOSPITAL_COMMUNITY): Payer: PPO

## 2022-10-28 ENCOUNTER — Emergency Department (HOSPITAL_BASED_OUTPATIENT_CLINIC_OR_DEPARTMENT_OTHER): Payer: PPO

## 2022-10-28 ENCOUNTER — Observation Stay (HOSPITAL_BASED_OUTPATIENT_CLINIC_OR_DEPARTMENT_OTHER)
Admission: EM | Admit: 2022-10-28 | Discharge: 2022-10-29 | Disposition: A | Discharge status: Home / Self Care | Payer: PPO | Attending: Family Medicine | Admitting: Family Medicine

## 2022-10-28 ENCOUNTER — Other Ambulatory Visit: Payer: Self-pay

## 2022-10-28 DIAGNOSIS — Z79899 Other long term (current) drug therapy: Secondary | ICD-10-CM | POA: Insufficient documentation

## 2022-10-28 DIAGNOSIS — K529 Noninfective gastroenteritis and colitis, unspecified: Secondary | ICD-10-CM | POA: Diagnosis not present

## 2022-10-28 DIAGNOSIS — K760 Fatty (change of) liver, not elsewhere classified: Secondary | ICD-10-CM | POA: Diagnosis not present

## 2022-10-28 DIAGNOSIS — J45909 Unspecified asthma, uncomplicated: Secondary | ICD-10-CM | POA: Diagnosis not present

## 2022-10-28 DIAGNOSIS — Z85828 Personal history of other malignant neoplasm of skin: Secondary | ICD-10-CM | POA: Diagnosis not present

## 2022-10-28 DIAGNOSIS — K922 Gastrointestinal hemorrhage, unspecified: Secondary | ICD-10-CM

## 2022-10-28 DIAGNOSIS — E039 Hypothyroidism, unspecified: Secondary | ICD-10-CM | POA: Diagnosis not present

## 2022-10-28 DIAGNOSIS — I10 Essential (primary) hypertension: Secondary | ICD-10-CM | POA: Diagnosis present

## 2022-10-28 DIAGNOSIS — I700 Atherosclerosis of aorta: Secondary | ICD-10-CM | POA: Diagnosis not present

## 2022-10-28 DIAGNOSIS — R109 Unspecified abdominal pain: Secondary | ICD-10-CM | POA: Diagnosis not present

## 2022-10-28 DIAGNOSIS — K625 Hemorrhage of anus and rectum: Principal | ICD-10-CM | POA: Diagnosis present

## 2022-10-28 DIAGNOSIS — F419 Anxiety disorder, unspecified: Secondary | ICD-10-CM | POA: Diagnosis present

## 2022-10-28 DIAGNOSIS — E785 Hyperlipidemia, unspecified: Secondary | ICD-10-CM | POA: Diagnosis present

## 2022-10-28 LAB — CBC
HCT: 40.9 % (ref 36.0–46.0)
Hemoglobin: 14.1 g/dL (ref 12.0–15.0)
MCH: 30.9 pg (ref 26.0–34.0)
MCHC: 34.5 g/dL (ref 30.0–36.0)
MCV: 89.7 fL (ref 80.0–100.0)
Platelets: 236 10*3/uL (ref 150–400)
RBC: 4.56 MIL/uL (ref 3.87–5.11)
RDW: 12.6 % (ref 11.5–15.5)
WBC: 13.5 10*3/uL — ABNORMAL HIGH (ref 4.0–10.5)
nRBC: 0 % (ref 0.0–0.2)

## 2022-10-28 LAB — CBC WITH DIFFERENTIAL/PLATELET
Abs Immature Granulocytes: 0.06 10*3/uL (ref 0.00–0.07)
Basophils Absolute: 0.1 10*3/uL (ref 0.0–0.1)
Basophils Relative: 1 %
Eosinophils Absolute: 0 10*3/uL (ref 0.0–0.5)
Eosinophils Relative: 0 %
HCT: 42.2 % (ref 36.0–46.0)
Hemoglobin: 14.6 g/dL (ref 12.0–15.0)
Immature Granulocytes: 0 %
Lymphocytes Relative: 15 %
Lymphs Abs: 2.1 10*3/uL (ref 0.7–4.0)
MCH: 31.2 pg (ref 26.0–34.0)
MCHC: 34.6 g/dL (ref 30.0–36.0)
MCV: 90.2 fL (ref 80.0–100.0)
Monocytes Absolute: 0.9 10*3/uL (ref 0.1–1.0)
Monocytes Relative: 6 %
Neutro Abs: 10.7 10*3/uL — ABNORMAL HIGH (ref 1.7–7.7)
Neutrophils Relative %: 78 %
Platelets: 246 10*3/uL (ref 150–400)
RBC: 4.68 MIL/uL (ref 3.87–5.11)
RDW: 12.7 % (ref 11.5–15.5)
WBC: 13.9 10*3/uL — ABNORMAL HIGH (ref 4.0–10.5)
nRBC: 0 % (ref 0.0–0.2)

## 2022-10-28 LAB — HEMOGLOBIN AND HEMATOCRIT, BLOOD
HCT: 39.9 % (ref 36.0–46.0)
Hemoglobin: 13.8 g/dL (ref 12.0–15.0)

## 2022-10-28 LAB — COMPREHENSIVE METABOLIC PANEL
ALT: 43 U/L (ref 0–44)
AST: 27 U/L (ref 15–41)
Albumin: 4.4 g/dL (ref 3.5–5.0)
Alkaline Phosphatase: 83 U/L (ref 38–126)
Anion gap: 9 (ref 5–15)
BUN: 16 mg/dL (ref 8–23)
CO2: 29 mmol/L (ref 22–32)
Calcium: 9.7 mg/dL (ref 8.9–10.3)
Chloride: 104 mmol/L (ref 98–111)
Creatinine, Ser: 0.76 mg/dL (ref 0.44–1.00)
GFR, Estimated: >60 mL/min (ref >60)
Glucose, Bld: 146 mg/dL — ABNORMAL HIGH (ref 70–99)
Potassium: 3.3 mmol/L — ABNORMAL LOW (ref 3.5–5.1)
Sodium: 142 mmol/L (ref 135–145)
Total Bilirubin: 0.6 mg/dL (ref 0.3–1.2)
Total Protein: 7.4 g/dL (ref 6.5–8.1)

## 2022-10-28 LAB — URINALYSIS, ROUTINE W REFLEX MICROSCOPIC
Bilirubin Urine: NEGATIVE
Glucose, UA: NEGATIVE mg/dL
Ketones, ur: NEGATIVE mg/dL
Leukocytes,Ua: NEGATIVE
Nitrite: NEGATIVE
Specific Gravity, Urine: 1.025 (ref 1.005–1.030)
pH: 6.5 (ref 5.0–8.0)

## 2022-10-28 LAB — OCCULT BLOOD X 1 CARD TO LAB, STOOL: Fecal Occult Bld: POSITIVE — AB

## 2022-10-28 LAB — LIPASE, BLOOD: Lipase: 13 U/L (ref 11–51)

## 2022-10-28 MED ORDER — LEVOTHYROXINE SODIUM 50 MCG PO TABS
50.0000 ug | ORAL_TABLET | Freq: Every day | ORAL | Status: DC
  Administered 2022-10-29: 50 ug via ORAL
  Filled 2022-10-28: qty 1

## 2022-10-28 MED ORDER — ZOLPIDEM TARTRATE 10 MG PO TABS: 5.0000 mg | ORAL_TABLET | Freq: Every evening | ORAL | Status: DC | PRN

## 2022-10-28 MED ORDER — AMLODIPINE BESYLATE 5 MG PO TABS
5.0000 mg | ORAL_TABLET | Freq: Every day | ORAL | Status: DC
  Administered 2022-10-28: 5 mg via ORAL
  Filled 2022-10-28: qty 1

## 2022-10-28 MED ORDER — ACETAMINOPHEN 650 MG RE SUPP: 650.0000 mg | Freq: Four times a day (QID) | RECTAL | Status: DC | PRN

## 2022-10-28 MED ORDER — IOHEXOL 350 MG/ML SOLN
100.0000 mL | Freq: Once | INTRAVENOUS | Status: AC | PRN
  Administered 2022-10-28: 80 mL via INTRAVENOUS

## 2022-10-28 MED ORDER — ATORVASTATIN CALCIUM 40 MG PO TABS
40.0000 mg | ORAL_TABLET | ORAL | Status: DC
  Filled 2022-10-28: qty 1

## 2022-10-28 MED ORDER — PROMETHAZINE HCL 25 MG/ML IJ SOLN: 25.0000 mg | Freq: Four times a day (QID) | INTRAMUSCULAR | Status: DC | PRN

## 2022-10-28 MED ORDER — LACTATED RINGERS IV SOLN: INTRAVENOUS | Status: DC

## 2022-10-28 MED ORDER — HYDRALAZINE HCL 20 MG/ML IJ SOLN: 5.0000 mg | INTRAMUSCULAR | Status: DC | PRN

## 2022-10-28 MED ORDER — CIPROFLOXACIN IN D5W 400 MG/200ML IV SOLN
400.0000 mg | Freq: Two times a day (BID) | INTRAVENOUS | Status: DC
  Administered 2022-10-28: 400 mg via INTRAVENOUS
  Filled 2022-10-28: qty 200

## 2022-10-28 MED ORDER — ONDANSETRON 4 MG PO TBDP
8.0000 mg | ORAL_TABLET | Freq: Three times a day (TID) | ORAL | Status: DC | PRN
  Administered 2022-10-29: 8 mg via ORAL
  Filled 2022-10-28: qty 2

## 2022-10-28 MED ORDER — POTASSIUM CHLORIDE CRYS ER 20 MEQ PO TBCR
20.0000 meq | EXTENDED_RELEASE_TABLET | Freq: Once | ORAL | Status: AC
  Administered 2022-10-28: 20 meq via ORAL
  Filled 2022-10-28: qty 1

## 2022-10-28 MED ORDER — IOHEXOL 300 MG/ML  SOLN
100.0000 mL | Freq: Once | INTRAMUSCULAR | Status: AC | PRN
  Administered 2022-10-28: 85 mL via INTRAVENOUS

## 2022-10-28 MED ORDER — ONDANSETRON 4 MG PO TBDP
4.0000 mg | ORAL_TABLET | Freq: Once | ORAL | Status: AC
  Administered 2022-10-28: 4 mg via ORAL
  Filled 2022-10-28: qty 1

## 2022-10-28 MED ORDER — ACETAMINOPHEN 325 MG PO TABS
650.0000 mg | ORAL_TABLET | Freq: Four times a day (QID) | ORAL | Status: DC | PRN
  Administered 2022-10-28 – 2022-10-29 (×2): 650 mg via ORAL
  Filled 2022-10-28 (×2): qty 2

## 2022-10-28 MED ORDER — ALPRAZOLAM 0.5 MG PO TABS
2.0000 mg | ORAL_TABLET | Freq: Two times a day (BID) | ORAL | Status: DC | PRN
  Administered 2022-10-28: 2 mg via ORAL
  Filled 2022-10-28: qty 4

## 2022-10-28 MED ORDER — SODIUM CHLORIDE 0.9 % IV SOLN
1.0000 g | Freq: Three times a day (TID) | INTRAVENOUS | Status: DC
  Administered 2022-10-28 – 2022-10-29 (×2): 1 g via INTRAVENOUS
  Filled 2022-10-28 (×4): qty 20

## 2022-10-28 MED ORDER — MONTELUKAST SODIUM 10 MG PO TABS
10.0000 mg | ORAL_TABLET | Freq: Every day | ORAL | Status: DC
  Administered 2022-10-29: 10 mg via ORAL
  Filled 2022-10-28: qty 1

## 2022-10-28 NOTE — ED Triage Notes (Signed)
Pt to ED c/o rectal bleeding that started this morning around 2 am, reports every time she has the urge to have a BM , sits on toilet and moderate amount of bright red blood. Reports last BM yesterday, reports wearing maxi pad at time, and not saturated. Reports hx of diverticulitis.

## 2022-10-28 NOTE — ED Notes (Signed)
Attempt to call report Floor nurse unavailable  States will call back when ready

## 2022-10-28 NOTE — Progress Notes (Signed)
Plan of Care Note for accepted transfer   Patient: [name] MRN: [id number]   DOA: 10/28/2022  Facility requesting transfer: [facility] Requesting Provider: Deno Etienne, DO Reason for transfer: Rectal bleeding. Facility course:  The patient presented with rectal bleeding that started around 0200 today.  She has multiple episodes of hematochezia with bright red blood.  She is hemodynamically stable.  Hemoglobin is normal.  She was offered by GI to go home with oral antibiotics and follow-up at the GI clinic or be admitted.  She preferred to be admitted.  Comprehensive metabolic panel [[id number]] (Abnormal)   Collected: 10/28/22 0958   Updated: 10/28/22 1042   Specimen Type: Blood   Specimen Source: Vein    Sodium 142 mmol/L   Potassium 3.3 Low  mmol/L   Chloride 104 mmol/L   CO2 29 mmol/L   Glucose, Bld 146 High  mg/dL   BUN 16 mg/dL   Creatinine, Ser 0.76 mg/dL   Calcium 9.7 mg/dL   Total Protein 7.4 g/dL   Albumin 4.4 g/dL   AST 27 U/L   ALT 43 U/L   Alkaline Phosphatase 83 U/L   Total Bilirubin 0.6 mg/dL   GFR, Estimated >60 mL/min   Anion gap 9  Lipase, blood [[id number]]   Collected: 10/28/22 0958   Updated: 10/28/22 1042   Specimen Type: Blood   Specimen Source: Vein    Lipase 13 U/L  Urinalysis, Routine w reflex microscopic Urine, Clean Catch [[id number]] (Abnormal)   Collected: 10/28/22 0958   Updated: 10/28/22 1026   Specimen Source: Urine, Clean Catch    Color, Urine YELLOW   APPearance CLEAR   Specific Gravity, Urine 1.025   pH 6.5   Glucose, UA NEGATIVE mg/dL   Hgb urine dipstick TRACE Abnormal    Bilirubin Urine NEGATIVE   Ketones, ur NEGATIVE mg/dL   Protein, ur TRACE Abnormal  mg/dL   Nitrite NEGATIVE   Leukocytes,Ua NEGATIVE   RBC / HPF 0-5 RBC/hpf   WBC, UA 0-5 WBC/hpf   Bacteria, UA RARE Abnormal    Squamous Epithelial / HPF 0-5 /HPF   Mucus PRESENT  Occult blood card to lab, stool [[id number]] (Abnormal)   Collected: 10/28/22 1000   Updated:  10/28/22 1011    Fecal Occult Bld POSITIVE Abnormal   CBC with Differential [[id number]] (Abnormal)   Collected: 10/28/22 0958   Updated: 10/28/22 1010   Specimen Type: Blood   Specimen Source: Vein    WBC 13.9 High  K/uL   RBC 4.68 MIL/uL   Hemoglobin 14.6 g/dL   HCT 42.2 %   MCV 90.2 fL   MCH 31.2 pg   MCHC 34.6 g/dL   RDW 12.7 %   Platelets 246 K/uL   nRBC 0.0 %   Neutrophils Relative % 78 %   Neutro Abs 10.7 High  K/uL   Lymphocytes Relative 15 %   Lymphs Abs 2.1 K/uL   Monocytes Relative 6 %   Monocytes Absolute 0.9 K/uL   Eosinophils Relative 0 %   Eosinophils Absolute 0.0 K/uL   Basophils Relative 1 %   Basophils Absolute 0.1 K/uL   Immature Granulocytes 0 %   Abs Immature Granulocytes 0.06 K/uL   Plan of care: The patient is accepted for admission to [place]  unit, at [address] [facility]   Author: Reubin Milan, MD 10/28/2022  Check [email or web address] for on-call coverage.  Nursing staff, Please call [place] number on Amion as soon as patient's arrival, so appropriate  admitting provider can evaluate the pt.

## 2022-10-28 NOTE — H&P (Signed)
History and Physical    Patient: [name] HYW:[id number] DOB: Jan 08, 1946 DOA: 10/28/2022 DOS: the patient was seen and examined on 10/28/2022 PCP: Mckinley Jewel, MD  Patient coming from: Home - lives with husband; NOK: Jonesha, [place], [phone]   Chief Complaint: Rectal bleeding  HPI: [name] is a 77 y.o. female with medical history significant of HTN and melanoma presenting with rectal bleeding.  She reports that she awoke this AM about 0200 with the urge to have a BM and instead had a thumb-sized amount of bright red blood in the commode.  She has continued to have increasing amounts of BRBPR about q30 minutes throughout the day, last about 30-45 minutes ago.  She is having some intermittent significant lower abdominal pain but some of that appears to be associated with fecal urgency.  One episode of vomiting at the onset, but she has had persistent nausea; she had a remote hiatal hernia surgery and isn't supposed to be able to vomit.  No sick contacts.      ER Course:  DB to [facility] transfer, per Dr. Olevia Bowens:  The patient presented with rectal bleeding that started around 0200 today. She has multiple episodes of hematochezia with bright red blood. She is hemodynamically stable. Hemoglobin is normal. She was offered by GI to go home with oral antibiotics and follow-up at the GI clinic or be admitted. She preferred to be admitted.      Review of Systems: As mentioned in the history of present illness. All other systems reviewed and are negative. Past Medical History:  Diagnosis Date   Depression    Hypertension    IBS (irritable bowel syndrome)    Malignant melanoma ([place])    Past Surgical History:  Procedure Laterality Date   BLADDER REPAIR     ESOPHAGOGASTRODUODENOSCOPY (EGD) WITH PROPOFOL N/A 07/24/2019   Procedure: ESOPHAGOGASTRODUODENOSCOPY (EGD) WITH PROPOFOL;  Surgeon: Wilford Corner, MD;  Location: [facility] ENDOSCOPY;  Service: Endoscopy;  Laterality: N/A;    FOOT SURGERY Bilateral    HERNIA REPAIR     MELANOMA EXCISION     VAGINAL HYSTERECTOMY     Social History:  reports that she has never smoked. She has never used smokeless tobacco. She reports that she does not drink alcohol and does not use drugs.  Allergies  Allergen Reactions   Codeine Nausea And Vomiting   Penicillins Swelling   Lisinopril Hives   Flagyl [Metronidazole] Hives   Sulfa Antibiotics Nausea And Vomiting    Family History  Problem Relation Age of Onset   Heart attack Mother    Hypertension Mother    Cancer Father    Diabetes Father    Hypertension Father    Breast cancer Paternal Grandfather     Prior to Admission medications   Medication Sig Start Date End Date Taking? Authorizing Provider  Acetaminophen (TYLENOL PO) Take 2 tablets by mouth daily as needed (for pain or headache).   Yes [provider]  albuterol (PROVENTIL) (2.5 MG/3ML) 0.083% nebulizer solution Take 3 mLs (2.5 mg total) by nebulization every 6 (six) hours as needed for wheezing or shortness of breath. 08/24/22  Yes Leath-Warren, Alda Lea, NP  ALPRAZolam Duanne Moron) 1 MG tablet Take 2 mg by mouth 2 (two) times daily as needed for anxiety.  05/16/14  Yes [provider]  amLODipine (NORVASC) 5 MG tablet Take 5 mg by mouth daily. Pt takes at night 09/01/22  Yes [provider]  atorvastatin (LIPITOR) 40 MG tablet Take  40 mg by mouth 3 (three) times a week. Monday,wedn,and friday 09/20/22  Yes [provider]  baclofen (LIORESAL) 10 MG tablet Take 1 tablet (10 mg total) by mouth 3 (three) times daily as needed for muscle spasms. 12/16/19  Yes Hilts, Legrand Como, MD  Cholecalciferol (VITAMIN D3 PO) Take by mouth daily.   Yes [provider]  cyclobenzaprine (FLEXERIL) 10 MG tablet Take 1 tablet (10 mg total) by mouth 2 (two) times daily as needed for muscle spasms. 01/06/22  Yes Barnet Pall E, NP  furosemide (LASIX) 20 MG tablet Take 20 mg by mouth daily as  needed. 06/26/22  Yes [provider]  hydrochlorothiazide (HYDRODIURIL) 25 MG tablet Take 25 mg by mouth daily. 05/11/14  Yes [provider]  Ketotifen Fumarate (ALAWAY OP) Place 5 drops into both eyes daily as needed (for allergies).   Yes [provider]  levothyroxine (SYNTHROID) 50 MCG tablet Take 50 mcg by mouth daily before breakfast.  02/13/19  Yes [provider]  meloxicam (MOBIC) 15 MG tablet Take 15 mg by mouth daily as needed for pain.  02/15/19  Yes [provider]  montelukast (SINGULAIR) 10 MG tablet Take 1 tablet by mouth daily. 02/03/19  Yes [provider]  traMADol (ULTRAM) 50 MG tablet Take 1 tablet (50 mg total) by mouth every 12 (twelve) hours as needed. 06/19/21  Yes Magnant, Charles L, PA-C  zolpidem (AMBIEN) 10 MG tablet Take 10 mg by mouth at bedtime as needed for sleep.  03/22/19  Yes [provider]  gabapentin (NEURONTIN) 300 MG capsule 1 PO q HS, may increase to 1 PO TID if needed Patient not taking: Reported on 10/28/2022 12/16/19   Hilts, Legrand Como, MD  lidocaine (XYLOCAINE) 2 % solution Use as directed 10 mLs in the mouth or throat every 6 (six) hours as needed (Stomach pain). Patient not taking: Reported on 10/28/2022 08/06/22   Margarita Mail, PA-C  methylPREDNISolone (MEDROL DOSEPAK) 4 MG TBPK tablet As directed for 6 days. Patient not taking: Reported on 10/28/2022 12/16/19   Hilts, Legrand Como, MD  pantoprazole (PROTONIX) 40 MG tablet Take 1 tablet (40 mg total) by mouth daily. Patient not taking: Reported on 10/28/2022 08/06/22   Margarita Mail, PA-C  promethazine-dextromethorphan (PROMETHAZINE-DM) 6.25-15 MG/5ML syrup Take 5 mLs by mouth 4 (four) times daily as needed for cough. Patient not taking: Reported on 10/28/2022 08/24/22   Leath-Warren, Alda Lea, NP  sucralfate (CARAFATE) 1 g tablet Take 1 tablet (1 g total) by mouth 4 (four) times daily -  with meals and at bedtime. Patient not taking: Reported on  10/28/2022 08/06/22   Margarita Mail, PA-C    Physical Exam: Vitals:   10/28/22 1700 10/28/22 1730 10/28/22 1740 10/28/22 1854  BP: (!) 155/84 (!) 154/69  (!) 176/66  Pulse: 65 65  75  Resp: '14 16  18  '$ Temp:   98.9 F (37.2 C) 97.9 F (36.6 C)  TempSrc:   Oral Oral  SpO2: 98% 95%  95%  Weight:      Height:       General:  Appears calm and comfortable and is in NAD Eyes: EOMI, normal lids, iris ENT:  grossly normal hearing, lips & tongue, mmm; appropriate dentition Neck:  no LAD, masses or thyromegaly Cardiovascular:  RRR, no m/r/g. No LE edema.  Respiratory:   CTA bilaterally with no wheezes/rales/rhonchi.  Normal respiratory effort. Abdomen:  soft, mildly diffusely TTP, ND Skin:  no rash or induration seen on limited exam Musculoskeletal:  grossly normal tone BUE/BLE, good ROM, no bony abnormality Psychiatric:  grossly normal mood and affect, speech fluent and appropriate, AOx3 Neurologic:  CN 2-12 grossly intact, moves all extremities in coordinated fashion   Radiological Exams on Admission: Independently reviewed - see discussion in A/P where applicable  CT ANGIO GI BLEED  Result Date: 10/28/2022 CLINICAL DATA:  Lower gastrointestinal bleeding, colitis on earlier CT EXAM: CTA ABDOMEN AND PELVIS WITHOUT AND WITH CONTRAST TECHNIQUE: Multidetector CT imaging of the abdomen and pelvis was performed using the standard protocol during bolus administration of intravenous contrast. Multiplanar reconstructed images and MIPs were obtained and reviewed to evaluate the vascular anatomy. RADIATION DOSE REDUCTION: This exam was performed according to the departmental dose-optimization program which includes automated exposure control, adjustment of the mA and/or kV according to patient size and/or use of iterative reconstruction technique. CONTRAST:  32m OMNIPAQUE IOHEXOL 350 MG/ML SOLN COMPARISON:  10/28/2022 at 11:07 a.m. FINDINGS: VASCULAR Aorta: Normal caliber aorta without aneurysm,  dissection, vasculitis or significant stenosis. Mild atherosclerosis. Celiac: Patent without evidence of aneurysm, dissection, vasculitis or significant stenosis. SMA: Patent without evidence of aneurysm, dissection, vasculitis or significant stenosis. Renals: There are 2 left renal arteries and a single right renal artery. The bilateral renal arteries are patent without evidence of aneurysm, dissection, vasculitis, fibromuscular dysplasia or significant stenosis. 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 No aneurysm, dissection, or significant stenosis. Inflow: Patent without evidence of aneurysm, dissection, vasculitis or significant stenosis. Proximal Outflow: Bilateral common femoral and visualized portions of the superficial and profunda femoral arteries are patent without evidence of aneurysm, dissection, vasculitis or significant stenosis. Veins: No obvious venous abnormality within the limitations of this arterial phase study. Review of the MIP images confirms the above findings. NON-VASCULAR Lower chest: No acute pleural or parenchymal lung disease. Hepatobiliary: Hepatic steatosis. No focal liver abnormality. Cholecystectomy. No biliary duct dilation. Pancreas: Unremarkable. No pancreatic ductal dilatation or surrounding inflammatory changes. Spleen: Normal in size without focal abnormality. Adrenals/Urinary Tract: Adrenal glands are unremarkable. Kidneys are normal, without renal calculi, focal lesion, or hydronephrosis. Bladder is unremarkable. Stomach/Bowel: There is wall thickening of the descending and proximal sigmoid colon, with mild pericolonic fat stranding, consistent with inflammatory or infectious colitis. Scattered diverticulosis of the rectosigmoid colon unchanged. Normal retrocecal appendix. No bowel obstruction or ileus. Small hiatal hernia. No intraluminal accumulation of contrast to  suggest active gastrointestinal bleeding. Lymphatic: No pathologic adenopathy. Reproductive: Status post hysterectomy. No adnexal masses. Other: No free fluid or free intraperitoneal gas. No abdominal wall hernia. Musculoskeletal: No acute or destructive bony lesions. Reconstructed images demonstrate no additional findings. IMPRESSION: VASCULAR 1. No evidence of active gastrointestinal bleeding. 2.  Aortic Atherosclerosis (ICD10-I70.0). NON-VASCULAR 1. Mural thickening and pericolonic fat stranding involving the descending and proximal sigmoid colon, consistent with inflammatory or infectious colitis. 2. Rectosigmoid diverticulosis without diverticulitis. 3. Hepatic steatosis. 4. Small hiatal hernia. Electronically Signed   By: MRanda NgoM.D.   On: 10/28/2022 20:53   CT ABDOMEN PELVIS W CONTRAST  Result Date: 10/28/2022 CLINICAL DATA:  Left lower quadrant pain. Rectal bright red blood beginning this morning. EXAM: CT ABDOMEN AND PELVIS WITH CONTRAST TECHNIQUE: Multidetector CT imaging of the abdomen and pelvis was performed using the standard protocol following bolus administration of intravenous contrast. RADIATION DOSE REDUCTION: This exam was performed according to the departmental dose-optimization program which includes automated exposure control, adjustment of the mA and/or kV  according to patient size and/or use of iterative reconstruction technique. CONTRAST:  51m OMNIPAQUE IOHEXOL 300 MG/ML  SOLN COMPARISON:  08/06/2022 FINDINGS: Lower chest: Lung bases are clear. Minimal calcified plaque over the descending thoracic aorta. Small sliding hiatal hernia present with surgical clips adjacent the hiatal hernia/gastroesophageal junction. Hepatobiliary: Minimal diffuse decreased attenuation of the liver likely a mild degree of steatosis without focal mass. Previous cholecystectomy. Biliary tree is normal. Pancreas: Normal. Spleen: Normal. Adrenals/Urinary Tract: Adrenal glands are normal. Kidneys are  normal size. No evidence of hydronephrosis, focal mass or nephrolithiasis. Ureters and bladder are normal. Stomach/Bowel: Small hiatal hernia with surgical clips adjacent the hernia and gastroesophageal junction as described above. Stomach is otherwise unremarkable. Small bowel is normal. Appendix is normal. Mild decompression of the descending colon with minimal submucosal edema as findings could be seen in a mild acute colitis of infectious or inflammatory nature. Mild diverticulosis of the sigmoid colon without active inflammation. Vascular/Lymphatic: Minimal calcified plaque over the abdominal aorta which is normal caliber. Remaining vascular structures are unremarkable. No adenopathy. Reproductive: Previous hysterectomy. Other: No significant free fluid.  No free peritoneal air. Musculoskeletal: No focal abnormality. IMPRESSION: 1. Mild decompression of the descending colon with minimal submucosal edema as findings may be due to a mild acute colitis of infectious or inflammatory nature. 2. Mild diverticulosis of the sigmoid colon without active inflammation. 3. Mild hepatic steatosis without focal mass. 4. Small sliding hiatal hernia with surgical clips adjacent the hiatal hernia/gastroesophageal junction. 5. Aortic atherosclerosis. Aortic Atherosclerosis (ICD10-I70.0). Electronically Signed   By: DMarin OlpM.D.   On: 10/28/2022 11:18    EKG: not done   Labs on Admission: I have personally reviewed the available labs and imaging studies at the time of the admission.  Pertinent labs:    K+ 3.3 Glucose 146 WBC 13.9 COVID/flu negative UA: trace Hgb, trace protein Heme positive   Assessment and Plan: Principal Problem:   Rectal bleeding Active Problems:   Hypertension   Dyslipidemia   Hypothyroidism   Anxiety   Asthma, chronic    Lower GI Bleeding -Differential to include bleeding hemorrhoids; bacterial infectious colitis with likely pathogen Campylobacter jejuni; colonic  diverticula; and AVM -CTA was performed and in conjunction with CT this appears to be a colitis as the cause and so IR intervention does not appear to be indicated -Will place in observation on med surg  -Continue to monitor for recurrent bleeding -GI consult has been requested, will see in AM -She does not have symptoms of anemia and Hgb has been stable, will continue to follow -Will treat with Cipro (and Flagyl if recommended by pharmacy - query in at this time) -Monitor closely and follow cbc q12h, transfuse as necessary for Hbg <7   HTN -Continue amlodipine -Hold HCTZ for now  HLD -Continue atorvastatin  Hypothyroidism -Continue Synthroid   Anxiety -Continue alprazolam prn  Asthma -Continue Singulair, Albuterol   Advance Care Planning:   Code Status: Full Code - Code status was discussed with the patient and/or family at the time of admission.  The patient would want to receive full resuscitative measures at this time.   Consults: GI  DVT Prophylaxis: SCDs  Family Communication: Husband was present throughout evaluation  Severity of Illness: The appropriate patient status for this patient is OBSERVATION. Observation status is judged to be reasonable and necessary in order to provide the required intensity of service to ensure the patient's safety. The patient's presenting symptoms, physical exam findings, and initial radiographic and  laboratory data in the context of their medical condition is felt to place them at decreased risk for further clinical deterioration. Furthermore, it is anticipated that the patient will be medically stable for discharge from the hospital within 2 midnights of admission.   Author: Karmen Bongo, MD 10/28/2022 9:26 PM  For on call review [email or web address].

## 2022-10-28 NOTE — ED Notes (Signed)
Handoff report given to [facility] 

## 2022-10-28 NOTE — ED Provider Notes (Signed)
[facility] EMERGENCY DEPT Provider Note   CSN: [id number] Arrival date & time: 10/28/22  0909     History  Chief Complaint  Patient presents with   Rectal Bleeding    [name] is a 77 y.o. female with a past medical history significant for hypertension, history of upper GI bleed, history of diverticulitis, and hyperlipidemia who presents to the ED due to numerous episodes of rectal bleeding.  Patient states she feels the urge to have a BM and goes to the bathroom and has bright red blood coming from rectum. Episodes occur every 20 to 30 minutes.  Last bowel movement was yesterday which was normal.  No straining.  History of external hemorrhoids.  Patient has a history of upper and lower GI bleed in 2020 thought to be secondary to acute diverticulitis and gastritis.  At that time she had an upper endoscopy which showed nonbleeding gastric ulcer and acute gastritis.  Not currently on any blood thinners.  Admits to some lightheadedness.  No chest pain or shortness of breath.  Endorses some lower abdominal pain. No fever or chills.   History obtained from patient and past medical records. No interpreter used during encounter.       Home Medications Prior to Admission medications   Medication Sig Start Date End Date Taking? Authorizing Provider  Acetaminophen (TYLENOL PO) Take 2 tablets by mouth daily as needed (for pain or headache).   Yes [provider]  albuterol (PROVENTIL) (2.5 MG/3ML) 0.083% nebulizer solution Take 3 mLs (2.5 mg total) by nebulization every 6 (six) hours as needed for wheezing or shortness of breath. 08/24/22  Yes Leath-Warren, Alda Lea, NP  ALPRAZolam Duanne Moron) 1 MG tablet Take 2 mg by mouth 2 (two) times daily as needed for anxiety.  05/16/14  Yes [provider]  amLODipine (NORVASC) 5 MG tablet Take 5 mg by mouth daily. Pt takes at night 09/01/22  Yes [provider]  atorvastatin (LIPITOR) 40 MG tablet Take 40 mg by  mouth 3 (three) times a week. Monday,wedn,and friday 09/20/22  Yes [provider]  baclofen (LIORESAL) 10 MG tablet Take 1 tablet (10 mg total) by mouth 3 (three) times daily as needed for muscle spasms. 12/16/19  Yes Hilts, Legrand Como, MD  Cholecalciferol (VITAMIN D3 PO) Take by mouth daily.   Yes [provider]  cyclobenzaprine (FLEXERIL) 10 MG tablet Take 1 tablet (10 mg total) by mouth 2 (two) times daily as needed for muscle spasms. 01/06/22  Yes Barnet Pall E, NP  furosemide (LASIX) 20 MG tablet Take 20 mg by mouth daily as needed. 06/26/22  Yes [provider]  hydrochlorothiazide (HYDRODIURIL) 25 MG tablet Take 25 mg by mouth daily. 05/11/14  Yes [provider]  Ketotifen Fumarate (ALAWAY OP) Place 5 drops into both eyes daily as needed (for allergies).   Yes [provider]  levothyroxine (SYNTHROID) 50 MCG tablet Take 50 mcg by mouth daily before breakfast.  02/13/19  Yes [provider]  meloxicam (MOBIC) 15 MG tablet Take 15 mg by mouth daily as needed for pain.  02/15/19  Yes [provider]  montelukast (SINGULAIR) 10 MG tablet Take 1 tablet by mouth daily. 02/03/19  Yes [provider]  traMADol (ULTRAM) 50 MG tablet Take 1 tablet (50 mg total) by mouth every 12 (twelve) hours as needed. 06/19/21  Yes Magnant, Charles L, PA-C  zolpidem (AMBIEN) 10 MG tablet Take 10 mg by mouth at bedtime as needed for sleep.  03/22/19  Yes [provider]  gabapentin (NEURONTIN) 300 MG capsule 1 PO q HS, may increase to 1 PO TID if needed Patient not taking: Reported on 10/28/2022 12/16/19   Hilts, Legrand Como, MD  lidocaine (XYLOCAINE) 2 % solution Use as directed 10 mLs in the mouth or throat every 6 (six) hours as needed (Stomach pain). Patient not taking: Reported on 10/28/2022 08/06/22   Margarita Mail, PA-C  methylPREDNISolone (MEDROL DOSEPAK) 4 MG TBPK tablet As directed for 6 days. Patient not taking: Reported on 10/28/2022 12/16/19    Hilts, Legrand Como, MD  pantoprazole (PROTONIX) 40 MG tablet Take 1 tablet (40 mg total) by mouth daily. Patient not taking: Reported on 10/28/2022 08/06/22   Margarita Mail, PA-C  promethazine-dextromethorphan (PROMETHAZINE-DM) 6.25-15 MG/5ML syrup Take 5 mLs by mouth 4 (four) times daily as needed for cough. Patient not taking: Reported on 10/28/2022 08/24/22   Leath-Warren, Alda Lea, NP  sucralfate (CARAFATE) 1 g tablet Take 1 tablet (1 g total) by mouth 4 (four) times daily -  with meals and at bedtime. Patient not taking: Reported on 10/28/2022 08/06/22   Margarita Mail, PA-C      Allergies    Codeine, Penicillins, Lisinopril, Flagyl [metronidazole], and Sulfa antibiotics    Review of Systems   Review of Systems  Constitutional:  Negative for chills and fever.  Respiratory:  Negative for shortness of breath.   Cardiovascular:  Negative for chest pain.  Gastrointestinal:  Positive for abdominal pain and blood in stool. Negative for constipation, diarrhea, nausea and vomiting.  Genitourinary:  Negative for dysuria and vaginal bleeding.  Musculoskeletal:  Negative for back pain.  All other systems reviewed and are negative.   Physical Exam Updated Vital Signs BP (!) 159/79   Pulse 66   Temp 97.8 F (36.6 C) (Oral)   Resp 11   Ht '5\' 4"'$  (1.626 m)   Wt 89.8 kg   SpO2 97%   BMI 33.99 kg/m  Physical Exam Vitals and nursing note reviewed.  Constitutional:      General: She is not in acute distress.    Appearance: She is not ill-appearing.  HENT:     Head: Normocephalic.  Eyes:     Pupils: Pupils are equal, round, and reactive to light.  Cardiovascular:     Rate and Rhythm: Normal rate and regular rhythm.     Pulses: Normal pulses.     Heart sounds: Normal heart sounds. No murmur heard.    No friction rub. No gallop.  Pulmonary:     Effort: Pulmonary effort is normal.     Breath sounds: Normal breath sounds.  Abdominal:     General: Abdomen is flat. There is no  distension.     Palpations: Abdomen is soft.     Tenderness: There is abdominal tenderness. There is no guarding or rebound.     Comments: Mild lower abdominal tenderness  Genitourinary:    Comments: Rectal exam performed with chaperone in room.  1 external hemorrhoid. Unable to get any stool, but right red blood dripping from rectum. Musculoskeletal:        General: Normal range of motion.     Cervical back: Neck supple.  Skin:    General: Skin is warm and dry.  Neurological:     General: No focal deficit present.     Mental Status: She is alert.  Psychiatric:        Mood and Affect: Mood normal.        Behavior: Behavior  normal.     ED Results / Procedures / Treatments   Labs (all labs ordered are listed, but only abnormal results are displayed) Labs Reviewed  CBC WITH DIFFERENTIAL/PLATELET - Abnormal; Notable for the following components:      Result Value   WBC 13.9 (*)    Neutro Abs 10.7 (*)    All other components within normal limits  COMPREHENSIVE METABOLIC PANEL - Abnormal; Notable for the following components:   Potassium 3.3 (*)    Glucose, Bld 146 (*)    All other components within normal limits  URINALYSIS, ROUTINE W REFLEX MICROSCOPIC - Abnormal; Notable for the following components:   Hgb urine dipstick TRACE (*)    Protein, ur TRACE (*)    Bacteria, UA RARE (*)    All other components within normal limits  OCCULT BLOOD X 1 CARD TO LAB, STOOL - Abnormal; Notable for the following components:   Fecal Occult Bld POSITIVE (*)    All other components within normal limits  LIPASE, BLOOD  HEMOGLOBIN AND HEMATOCRIT, BLOOD  HEMOGLOBIN AND HEMATOCRIT, BLOOD  POC OCCULT BLOOD, ED    EKG None  Radiology CT ABDOMEN PELVIS W CONTRAST  Result Date: 10/28/2022 CLINICAL DATA:  Left lower quadrant pain. Rectal bright red blood beginning this morning. EXAM: CT ABDOMEN AND PELVIS WITH CONTRAST TECHNIQUE: Multidetector CT imaging of the abdomen and pelvis was performed  using the standard protocol following bolus administration of intravenous contrast. RADIATION DOSE REDUCTION: This exam was performed according to the departmental dose-optimization program which includes automated exposure control, adjustment of the mA and/or kV according to patient size and/or use of iterative reconstruction technique. CONTRAST:  38m OMNIPAQUE IOHEXOL 300 MG/ML  SOLN COMPARISON:  08/06/2022 FINDINGS: Lower chest: Lung bases are clear. Minimal calcified plaque over the descending thoracic aorta. Small sliding hiatal hernia present with surgical clips adjacent the hiatal hernia/gastroesophageal junction. Hepatobiliary: Minimal diffuse decreased attenuation of the liver likely a mild degree of steatosis without focal mass. Previous cholecystectomy. Biliary tree is normal. Pancreas: Normal. Spleen: Normal. Adrenals/Urinary Tract: Adrenal glands are normal. Kidneys are normal size. No evidence of hydronephrosis, focal mass or nephrolithiasis. Ureters and bladder are normal. Stomach/Bowel: Small hiatal hernia with surgical clips adjacent the hernia and gastroesophageal junction as described above. Stomach is otherwise unremarkable. Small bowel is normal. Appendix is normal. Mild decompression of the descending colon with minimal submucosal edema as findings could be seen in a mild acute colitis of infectious or inflammatory nature. Mild diverticulosis of the sigmoid colon without active inflammation. Vascular/Lymphatic: Minimal calcified plaque over the abdominal aorta which is normal caliber. Remaining vascular structures are unremarkable. No adenopathy. Reproductive: Previous hysterectomy. Other: No significant free fluid.  No free peritoneal air. Musculoskeletal: No focal abnormality. IMPRESSION: 1. Mild decompression of the descending colon with minimal submucosal edema as findings may be due to a mild acute colitis of infectious or inflammatory nature. 2. Mild diverticulosis of the sigmoid colon  without active inflammation. 3. Mild hepatic steatosis without focal mass. 4. Small sliding hiatal hernia with surgical clips adjacent the hiatal hernia/gastroesophageal junction. 5. Aortic atherosclerosis. Aortic Atherosclerosis (ICD10-I70.0). Electronically Signed   By: DMarin OlpM.D.   On: 10/28/2022 11:18    Procedures Procedures    Medications Ordered in ED Medications  ciprofloxacin (CIPRO) IVPB 400 mg (400 mg Intravenous New Bag/Given 10/28/22 1405)  iohexol (OMNIPAQUE) 300 MG/ML solution 100 mL (85 mLs Intravenous Contrast Given 10/28/22 1054)  potassium chloride SA (KLOR-CON M) CR tablet 20 mEq (  20 mEq Oral Given 10/28/22 1107)    ED Course/ Medical Decision Making/ A&P Clinical Course as of 10/28/22 1526  Tue Oct 28, 2022  1050 Fecal Occult Blood, POC(!): POSITIVE [CA]  1050 Potassium(!): 3.3 [CA]  1050 Hgb urine dipstick(!): TRACE [CA]  1050 Protein(!): TRACE [CA]  1051 Bacteria, UA(!): RARE [CA]  1051 WBC(!): 13.9 [CA]  1051 Hemoglobin: 14.6 [CA]    Clinical Course User Index [CA] Suzy Bouchard, PA-C                             Medical Decision Making Amount and/or Complexity of Data Reviewed Independent Historian: spouse External Data Reviewed: notes.    Details: Previous admission notes Labs: ordered. Decision-making details documented in ED Course. Radiology: ordered and independent interpretation performed. Decision-making details documented in ED Course.  Risk Prescription drug management. Decision regarding hospitalization.   This patient presents to the ED for concern of rectal bleeding, this involves an extensive number of treatment options, and is a complaint that carries with it a high risk of complications and morbidity.  The differential diagnosis includes hemorrhoids, diverticular bleed, perforation, etc  77 year old female presents to the ED due to rectal bleeding that started around 2 AM this morning.  History of upper and lower GI bleed  in 2020.  Not currently blood thinners.  Previous GI bleeds thought to be secondary to gastritis and diverticulitis.  Upon arrival, stable vitals.  Patient is afebrile, not tachycardic or hypoxic.  Patient in no acute distress.  Mild lower abdominal tenderness without rebound or guarding.  Rectal exam performed with chaperone in room which demonstrated an external hemorrhoid.  Unable to obtain stool sample however, blood dripping from rectum.  Routine labs ordered to check hemoglobin.   CMP Significant for hypokalemia at 3.3.  Potassium repleted here in the ED.  Normal renal function.  CBC significant for mild leukocytosis at 13.9.  UA significant for hematuria likely due to rectal bleeding.  Also does demonstrate proteinuria.  No evidence of infection.  Lipase normal.  Doubt pancreatitis.  CT abdomen to rule out evidence of Diverticulitis.   Ct abdomen personally reviewed which demonstrates acute colitis and mild diverticulosis.  Will discuss with [facility] given history of GI bleed.  1:01 PM Discussed with Dr. Alessandra Bevels with [facility] who recommends shared decision making with patient for admission for observation vs. Outpatient therapy for colitis. Discussed with patient and husband at bedside who prefers to be admitted to the hospital. Will consult hospitalist for admission. Informed GI that patient will be staying.   2:29 PM Discussed with Dr. Olevia Bowens with [facility] who agrees to admit patient for further treatment. He request H&H every 6 hours starting at 1600. Order placed.   Discussed with Dr. Tyrone Nine who agrees with assessment and plan.        Final Clinical Impression(s) / ED Diagnoses Final diagnoses:  Gastrointestinal hemorrhage, unspecified gastrointestinal hemorrhage type  Colitis    Rx / DC Orders ED Discharge Orders     None         Suzy Bouchard, PA-C 10/28/22 [place], DO 10/29/22 [phone]

## 2022-10-29 DIAGNOSIS — K922 Gastrointestinal hemorrhage, unspecified: Secondary | ICD-10-CM | POA: Diagnosis not present

## 2022-10-29 DIAGNOSIS — I10 Essential (primary) hypertension: Secondary | ICD-10-CM | POA: Diagnosis not present

## 2022-10-29 DIAGNOSIS — K529 Noninfective gastroenteritis and colitis, unspecified: Secondary | ICD-10-CM

## 2022-10-29 DIAGNOSIS — K625 Hemorrhage of anus and rectum: Secondary | ICD-10-CM | POA: Diagnosis not present

## 2022-10-29 DIAGNOSIS — F419 Anxiety disorder, unspecified: Secondary | ICD-10-CM

## 2022-10-29 DIAGNOSIS — R935 Abnormal findings on diagnostic imaging of other abdominal regions, including retroperitoneum: Secondary | ICD-10-CM | POA: Diagnosis not present

## 2022-10-29 LAB — CBC
HCT: 38.1 % (ref 36.0–46.0)
Hemoglobin: 12.7 g/dL (ref 12.0–15.0)
MCH: 30.6 pg (ref 26.0–34.0)
MCHC: 33.3 g/dL (ref 30.0–36.0)
MCV: 91.8 fL (ref 80.0–100.0)
Platelets: 210 10*3/uL (ref 150–400)
RBC: 4.15 MIL/uL (ref 3.87–5.11)
RDW: 13 % (ref 11.5–15.5)
WBC: 10.1 10*3/uL (ref 4.0–10.5)
nRBC: 0 % (ref 0.0–0.2)

## 2022-10-29 LAB — MAGNESIUM: Magnesium: 1.7 mg/dL (ref 1.7–2.4)

## 2022-10-29 LAB — BASIC METABOLIC PANEL WITH GFR
Anion gap: 7 (ref 5–15)
BUN: 12 mg/dL (ref 8–23)
CO2: 27 mmol/L (ref 22–32)
Calcium: 8.5 mg/dL — ABNORMAL LOW (ref 8.9–10.3)
Chloride: 103 mmol/L (ref 98–111)
Creatinine, Ser: 0.8 mg/dL (ref 0.44–1.00)
GFR, Estimated: >60 mL/min
Glucose, Bld: 121 mg/dL — ABNORMAL HIGH (ref 70–99)
Potassium: 3 mmol/L — ABNORMAL LOW (ref 3.5–5.1)
Sodium: 137 mmol/L (ref 135–145)

## 2022-10-29 LAB — POTASSIUM: Potassium: 3.8 mmol/L (ref 3.5–5.1)

## 2022-10-29 MED ORDER — AMOXICILLIN-POT CLAVULANATE 875-125 MG PO TABS: 1.0000 | ORAL_TABLET | Freq: Two times a day (BID) | ORAL | 0 refills | Status: AC

## 2022-10-29 MED ORDER — PROMETHAZINE HCL 25 MG PO TABS
12.5000 mg | ORAL_TABLET | Freq: Four times a day (QID) | ORAL | Status: DC | PRN
  Administered 2022-10-29: 12.5 mg via ORAL
  Filled 2022-10-29: qty 1

## 2022-10-29 MED ORDER — POTASSIUM CHLORIDE CRYS ER 20 MEQ PO TBCR
40.0000 meq | EXTENDED_RELEASE_TABLET | ORAL | Status: AC
  Administered 2022-10-29 (×2): 40 meq via ORAL
  Filled 2022-10-29 (×2): qty 2

## 2022-10-29 MED ORDER — POTASSIUM CHLORIDE CRYS ER 20 MEQ PO TBCR: 20.0000 meq | EXTENDED_RELEASE_TABLET | Freq: Every day | ORAL | 2 refills | Status: DC

## 2022-10-29 MED ORDER — MAGNESIUM SULFATE IN D5W 1-5 GM/100ML-% IV SOLN
1.0000 g | Freq: Once | INTRAVENOUS | Status: AC
  Administered 2022-10-29: 1 g via INTRAVENOUS
  Filled 2022-10-29: qty 100

## 2022-10-29 MED ORDER — AMOXICILLIN-POT CLAVULANATE 875-125 MG PO TABS
1.0000 | ORAL_TABLET | Freq: Two times a day (BID) | ORAL | Status: DC
  Administered 2022-10-29: 1 via ORAL
  Filled 2022-10-29: qty 1

## 2022-10-29 NOTE — Consult Note (Signed)
Referring Provider:  [place] Primary Care Physician:  Mckinley Jewel, MD Primary Gastroenterologist:  Dr. Therisa Doyne  Reason for Consultation: Rectal bleeding, abdominal pain  HPI: [name] is a 77 y.o. female with past medical history of irritable bowel syndrome and hiatal hernia was seen in the emergency department yesterday for acute onset of left lower quadrant abdominal pain and rectal bleeding.  Patient was feeling fine until yesterday when she started having nausea and vomiting followed by left lower quadrant abdominal pain and rectal bleeding.  Multiple episodes of rectal bleeding since yesterday.  Was seen in the emergency department.  Initial blood work showed normal hemoglobin but elevated white counts at 13.9.  Normal LFTs.  Normal lipase.  CT abdomen pelvis with IV contrast showed possible mild inflammation in the descending colon and diverticulosis.  Of ongoing GI bleed she underwent CT angio yesterday which again showed colitis involving descending and proximal sigmoid colon without any evidence of active bleeding.  Last colonoscopy several years ago.  Family history of colon cancer.  Denies significant NSAID use.  She continues to have rectal bleeding.  Abdominal pain has improved.  Denies nausea or vomiting.  Past Medical History:  Diagnosis Date   Depression    Hypertension    IBS (irritable bowel syndrome)    Malignant melanoma ([place])     Past Surgical History:  Procedure Laterality Date   BLADDER REPAIR     ESOPHAGOGASTRODUODENOSCOPY (EGD) WITH PROPOFOL N/A 07/24/2019   Procedure: ESOPHAGOGASTRODUODENOSCOPY (EGD) WITH PROPOFOL;  Surgeon: Wilford Corner, MD;  Location: [facility];  Service: Endoscopy;  Laterality: N/A;   FOOT SURGERY Bilateral    HERNIA REPAIR     MELANOMA EXCISION     VAGINAL HYSTERECTOMY      Prior to Admission medications   Medication Sig Start Date End Date Taking? Authorizing Provider  Acetaminophen (TYLENOL PO) Take 2 tablets by mouth  daily as needed (for pain or headache).   Yes [provider]  albuterol (PROVENTIL) (2.5 MG/3ML) 0.083% nebulizer solution Take 3 mLs (2.5 mg total) by nebulization every 6 (six) hours as needed for wheezing or shortness of breath. 08/24/22  Yes Leath-Warren, Alda Lea, NP  ALPRAZolam Duanne Moron) 1 MG tablet Take 2 mg by mouth 2 (two) times daily as needed for anxiety.  05/16/14  Yes [provider]  amLODipine (NORVASC) 5 MG tablet Take 5 mg by mouth daily. Pt takes at night 09/01/22  Yes [provider]  atorvastatin (LIPITOR) 40 MG tablet Take 40 mg by mouth 3 (three) times a week. Monday,wedn,and friday 09/20/22  Yes [provider]  baclofen (LIORESAL) 10 MG tablet Take 1 tablet (10 mg total) by mouth 3 (three) times daily as needed for muscle spasms. 12/16/19  Yes Hilts, Legrand Como, MD  Cholecalciferol (VITAMIN D3 PO) Take by mouth daily.   Yes [provider]  cyclobenzaprine (FLEXERIL) 10 MG tablet Take 1 tablet (10 mg total) by mouth 2 (two) times daily as needed for muscle spasms. 01/06/22  Yes Barnet Pall E, NP  furosemide (LASIX) 20 MG tablet Take 20 mg by mouth daily as needed. 06/26/22  Yes [provider]  hydrochlorothiazide (HYDRODIURIL) 25 MG tablet Take 25 mg by mouth daily. 05/11/14  Yes [provider]  Ketotifen Fumarate (ALAWAY OP) Place 5 drops into both eyes daily as needed (for allergies).   Yes [provider]  levothyroxine (SYNTHROID) 50 MCG tablet Take 50 mcg by mouth daily before breakfast.  02/13/19  Yes [provider]  meloxicam (MOBIC) 15 MG tablet Take 15 mg by mouth daily as needed for pain.  02/15/19  Yes [provider]  montelukast (SINGULAIR) 10 MG tablet Take 1 tablet by mouth daily. 02/03/19  Yes [provider]  traMADol (ULTRAM) 50 MG tablet Take 1 tablet (50 mg total) by mouth every 12 (twelve) hours as needed. 06/19/21  Yes Magnant, Charles L, PA-C  zolpidem (AMBIEN) 10 MG  tablet Take 10 mg by mouth at bedtime as needed for sleep.  03/22/19  Yes [provider]    Scheduled Meds:  amLODipine  5 mg Oral QHS   atorvastatin  40 mg Oral Once per day on Mon Wed Fri   levothyroxine  50 mcg Oral QAC breakfast   montelukast  10 mg Oral Daily   Continuous Infusions:  lactated ringers 100 mL/hr at 10/29/22 0347   meropenem (MERREM) IV 1 g (10/29/22 0531)   PRN Meds:.acetaminophen **OR** acetaminophen, ALPRAZolam, hydrALAZINE, ondansetron, promethazine (PHENERGAN) injection (IM or IVPB), zolpidem  Allergies as of 10/28/2022 - Review Complete 10/28/2022  Allergen Reaction Noted   Codeine Nausea And Vomiting 05/28/2014   Penicillins Swelling 05/28/2014   Lisinopril Hives 08/06/2022   Flagyl [metronidazole] Hives 05/28/2014   Sulfa antibiotics Nausea And Vomiting 05/28/2014    Family History  Problem Relation Age of Onset   Heart attack Mother    Hypertension Mother    Cancer Father    Diabetes Father    Hypertension Father    Breast cancer Paternal Grandfather     Social History   Socioeconomic History   Marital status: Married    Spouse name: [name]   Number of children: 2   Years of education: 12th   Highest education level: Not on file  Occupational History   Occupation: retired  Tobacco Use   Smoking status: Never   Smokeless tobacco: Never  Vaping Use   Vaping Use: Never used  Substance and Sexual Activity   Alcohol use: No   Drug use: No   Sexual activity: Not on file  Other Topics Concern   Not on file  Social History Narrative   Patient lives at home with her spouse.   Caffeine Use: 1-2 cups daily   Social Determinants of Health   Financial Resource Strain: Not on file  Food Insecurity: Not on file  Transportation Needs: Not on file  Physical Activity: Not on file  Stress: Not on file  Social Connections: Not on file  Intimate Partner Violence: Not on file    Review of Systems: All negative except as stated above in  HPI.  Physical Exam: Vital signs: Vitals:   10/29/22 0253 10/29/22 0649  BP: (!) 99/49 124/62  Pulse: 62 66  Resp: 14 14  Temp: 98.3 F (36.8 C) 98 F (36.7 C)  SpO2: 93% 94%   Last BM Date : 10/27/22 General:   Alert,  Well-developed, well-nourished, pleasant and cooperative in NAD HEENT - NS, AT, EOMI Lungs:  Clear throughout to auscultation.   No wheezes, crackles, or rhonchi. No acute distress. Heart:  Regular rate and rhythm; no murmurs, clicks, rubs,  or gallops. Abdomen: Mild left lower quadrant tenderness to palpation, abdomen is soft, bowel sounds present, no peritoneal signs  Mood and affect normal  Alert and oriented x 3 Rectal:  Deferred  GI:  Lab Results: Recent Labs    10/28/22 0958 10/28/22 1600 10/28/22 1927 10/29/22 0502  WBC 13.9*  --  13.5* 10.1  HGB 14.6 13.8  14.1 12.7  HCT 42.2 39.9 40.9 38.1  PLT 246  --  236 210   BMET Recent Labs    10/28/22 0958 10/29/22 0502  NA 142 137  K 3.3* 3.0*  CL 104 103  CO2 29 27  GLUCOSE 146* 121*  BUN 16 12  CREATININE 0.76 0.80  CALCIUM 9.7 8.5*   LFT Recent Labs    10/28/22 0958  PROT 7.4  ALBUMIN 4.4  AST 27  ALT 43  ALKPHOS 83  BILITOT 0.6   PT/INR No results for input(s): "LABPROT", "INR" in the last 72 hours.   Studies/Results: CT ANGIO GI BLEED  Result Date: 10/28/2022 CLINICAL DATA:  Lower gastrointestinal bleeding, colitis on earlier CT EXAM: CTA ABDOMEN AND PELVIS WITHOUT AND WITH CONTRAST TECHNIQUE: Multidetector CT imaging of the abdomen and pelvis was performed using the standard protocol during bolus administration of intravenous contrast. Multiplanar reconstructed images and MIPs were obtained and reviewed to evaluate the vascular anatomy. RADIATION DOSE REDUCTION: This exam was performed according to the departmental dose-optimization program which includes automated exposure control, adjustment of the mA and/or kV according to patient size and/or use of iterative  reconstruction technique. CONTRAST:  77m OMNIPAQUE IOHEXOL 350 MG/ML SOLN COMPARISON:  10/28/2022 at 11:07 a.m. FINDINGS: VASCULAR Aorta: Normal caliber aorta without aneurysm, dissection, vasculitis or significant stenosis. Mild atherosclerosis. Celiac: Patent without evidence of aneurysm, dissection, vasculitis or significant stenosis. SMA: Patent without evidence of aneurysm, dissection, vasculitis or significant stenosis. Renals: There are 2 left renal arteries and a single right renal artery. The bilateral renal arteries are patent without evidence of aneurysm, dissection, vasculitis, fibromuscular dysplasia or significant stenosis. 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 No aneurysm, dissection, or significant stenosis. Inflow: Patent without evidence of aneurysm, dissection, vasculitis or significant stenosis. Proximal Outflow: Bilateral common femoral and visualized portions of the superficial and profunda femoral arteries are patent without evidence of aneurysm, dissection, vasculitis or significant stenosis. Veins: No obvious venous abnormality within the limitations of this arterial phase study. Review of the MIP images confirms the above findings. NON-VASCULAR Lower chest: No acute pleural or parenchymal lung disease. Hepatobiliary: Hepatic steatosis. No focal liver abnormality. Cholecystectomy. No biliary duct dilation. Pancreas: Unremarkable. No pancreatic ductal dilatation or surrounding inflammatory changes. Spleen: Normal in size without focal abnormality. Adrenals/Urinary Tract: Adrenal glands are unremarkable. Kidneys are normal, without renal calculi, focal lesion, or hydronephrosis. Bladder is unremarkable. Stomach/Bowel: There is wall thickening of the descending and proximal sigmoid colon, with mild pericolonic fat stranding, consistent with inflammatory or infectious colitis. Scattered  diverticulosis of the rectosigmoid colon unchanged. Normal retrocecal appendix. No bowel obstruction or ileus. Small hiatal hernia. No intraluminal accumulation of contrast to suggest active gastrointestinal bleeding. Lymphatic: No pathologic adenopathy. Reproductive: Status post hysterectomy. No adnexal masses. Other: No free fluid or free intraperitoneal gas. No abdominal wall hernia. Musculoskeletal: No acute or destructive bony lesions. Reconstructed images demonstrate no additional findings. IMPRESSION: VASCULAR 1. No evidence of active gastrointestinal bleeding. 2.  Aortic Atherosclerosis (ICD10-I70.0). NON-VASCULAR 1. Mural thickening and pericolonic fat stranding involving the descending and proximal sigmoid colon, consistent with inflammatory or infectious colitis. 2. Rectosigmoid diverticulosis without diverticulitis. 3. Hepatic steatosis. 4. Small hiatal hernia. Electronically Signed   By: MRanda NgoM.D.   On: 10/28/2022 20:53   CT ABDOMEN PELVIS W CONTRAST  Result Date: 10/28/2022 CLINICAL DATA:  Left lower quadrant pain. Rectal bright red  blood beginning this morning. EXAM: CT ABDOMEN AND PELVIS WITH CONTRAST TECHNIQUE: Multidetector CT imaging of the abdomen and pelvis was performed using the standard protocol following bolus administration of intravenous contrast. RADIATION DOSE REDUCTION: This exam was performed according to the departmental dose-optimization program which includes automated exposure control, adjustment of the mA and/or kV according to patient size and/or use of iterative reconstruction technique. CONTRAST:  43m OMNIPAQUE IOHEXOL 300 MG/ML  SOLN COMPARISON:  08/06/2022 FINDINGS: Lower chest: Lung bases are clear. Minimal calcified plaque over the descending thoracic aorta. Small sliding hiatal hernia present with surgical clips adjacent the hiatal hernia/gastroesophageal junction. Hepatobiliary: Minimal diffuse decreased attenuation of the liver likely a mild degree of  steatosis without focal mass. Previous cholecystectomy. Biliary tree is normal. Pancreas: Normal. Spleen: Normal. Adrenals/Urinary Tract: Adrenal glands are normal. Kidneys are normal size. No evidence of hydronephrosis, focal mass or nephrolithiasis. Ureters and bladder are normal. Stomach/Bowel: Small hiatal hernia with surgical clips adjacent the hernia and gastroesophageal junction as described above. Stomach is otherwise unremarkable. Small bowel is normal. Appendix is normal. Mild decompression of the descending colon with minimal submucosal edema as findings could be seen in a mild acute colitis of infectious or inflammatory nature. Mild diverticulosis of the sigmoid colon without active inflammation. Vascular/Lymphatic: Minimal calcified plaque over the abdominal aorta which is normal caliber. Remaining vascular structures are unremarkable. No adenopathy. Reproductive: Previous hysterectomy. Other: No significant free fluid.  No free peritoneal air. Musculoskeletal: No focal abnormality. IMPRESSION: 1. Mild decompression of the descending colon with minimal submucosal edema as findings may be due to a mild acute colitis of infectious or inflammatory nature. 2. Mild diverticulosis of the sigmoid colon without active inflammation. 3. Mild hepatic steatosis without focal mass. 4. Small sliding hiatal hernia with surgical clips adjacent the hiatal hernia/gastroesophageal junction. 5. Aortic atherosclerosis. Aortic Atherosclerosis (ICD10-I70.0). Electronically Signed   By: DMarin OlpM.D.   On: 10/28/2022 11:18    Impression/Plan: -Acute onset of rectal bleeding with CT scan showing colitis involving descending and proximal sigmoid colon.  Likely ischemic versus infectious colitis given elevated white counts. -Small hiatal hernia with history of previous surgery for hernia  Recommendations ------------------------- -She is feeling somewhat better on antibiotics.  Patient is allergic to penicillin,  Flagyl and sulfa antibiotics and currently on IV meropenem.  Discussed with Dr. LDarrick Meigs  He will discuss with pharmacy regarding appropriate oral regimen for antibiotics. -Recommend antibiotics for total 10 days. -No plan for inpatient colonoscopy.  Okay to discharge from GI standpoint once able to tolerate diet. -Advance diet to soft.  Recommend outpatient follow-up in GI clinic in few weeks to set up outpatient colonoscopy.  GI will sign off.  [place] if needed.     LOS: 0 days   POtis Brace MD, FACP 10/29/2022, 9:46 AM  Contact #  [phone]

## 2022-10-29 NOTE — Discharge Summary (Signed)
Physician Discharge Summary   Patient: [name] MRN: [id number] DOB: 1946-07-16  Admit date:     10/28/2022  Discharge date: 10/29/22  Discharge Physician: Oswald Hillock   PCP: Mckinley Jewel, MD   Recommendations at discharge:   Follow-up gastroenterology in 6 weeks Follow-up PCP in 1 week to check BMP as outpatient  Discharge Diagnoses: Principal Problem:   Rectal bleeding Active Problems:   Hypertension   Dyslipidemia   Hypothyroidism   Anxiety   Asthma, chronic  Resolved Problems:   * No resolved hospital problems. *  Hospital Course:  77 y.o. female with medical history significant of HTN and melanoma presenting with rectal bleeding.  She reports that she awoke this AM about 0200 with the urge to have a BM and instead had a thumb-sized amount of bright red blood in the commode.  She has continued to have increasing amounts of BRBPR about q30 minutes throughout the day, last about 30-45 minutes ago.  She is having some intermittent significant lower abdominal pain but some of that appears to be associated with fecal urgency.  One episode of vomiting at the onset, but she has had persistent nausea; she had a remote hiatal hernia surgery and isn't supposed to be able to vomit.  No sick contacts.     Assessment and Plan:  Rectal bleeding -CT abdomen/pelvis showed colitis involving descending and proximal sigmoid colon -Likely ischemic versus infectious colitis given leukocytosis; initially started on meropenem in the hospital -Patient started on Augmentin 1 tablet p.o. twice daily in the hospital, as there was history of previous penicillin allergy, 50 years ago -Patient was monitored after giving 1 dose of Augmentin at 12 PM, she tolerated Augmentin well.  No allergic reaction noted. -Will discharge patient on Augmentin 1 tablet p.o. twice daily for 10 more days. -Rectal bleeding has improved, GI recommends to follow-up as outpatient in 6 weeks. -GI has signed  off   Hypokalemia -Likely in setting of diarrhea and diuretic use at home -Patient takes 25 mg HCTZ for hypertension -Potassium was replaced, repeat potassium is 3.8 -Will discharge on Kdur  20 mEq p.o. daily to be taken along with HCTZ  Hyperlipidemia -Continue atorvastatin  Hypothyroidism -Continue Synthroid  Anxiety -Continue alprazolam as needed  Asthma -Continue home regimen       Consultants: Gastroenterology Procedures performed:  Disposition: Home Diet recommendation:  Discharge Diet Orders (From admission, onward)     Start     Ordered   10/29/22 0000  Diet - low sodium heart healthy        10/29/22 1826           Regular diet DISCHARGE MEDICATION: Allergies as of 10/29/2022       Reactions   Flagyl [metronidazole] Hives   hives and welts   Codeine Nausea And Vomiting   Penicillins Swelling   Pt states she had some hand swelling from pcn ~ 50 yrs ago when she was pregnant   Lisinopril Hives   Sulfa Antibiotics Nausea And Vomiting        Medication List     STOP taking these medications    meloxicam 15 MG tablet Commonly known as: MOBIC       TAKE these medications    ALAWAY OP Place 5 drops into both eyes daily as needed (for allergies).   albuterol (2.5 MG/3ML) 0.083% nebulizer solution Commonly known as: PROVENTIL Take 3 mLs (2.5 mg total) by nebulization every 6 (six) hours as needed for  wheezing or shortness of breath.   ALPRAZolam 1 MG tablet Commonly known as: XANAX Take 2 mg by mouth 2 (two) times daily as needed for anxiety.   amLODipine 5 MG tablet Commonly known as: NORVASC Take 5 mg by mouth daily. Pt takes at night   amoxicillin-clavulanate 875-125 MG tablet Commonly known as: AUGMENTIN Take 1 tablet by mouth every 12 (twelve) hours for 10 days.   atorvastatin 40 MG tablet Commonly known as: LIPITOR Take 40 mg by mouth 3 (three) times a week. Monday,wedn,and friday   baclofen 10 MG tablet Commonly known  as: LIORESAL Take 1 tablet (10 mg total) by mouth 3 (three) times daily as needed for muscle spasms.   cyclobenzaprine 10 MG tablet Commonly known as: FLEXERIL Take 1 tablet (10 mg total) by mouth 2 (two) times daily as needed for muscle spasms.   furosemide 20 MG tablet Commonly known as: LASIX Take 20 mg by mouth daily as needed.   hydrochlorothiazide 25 MG tablet Commonly known as: HYDRODIURIL Take 25 mg by mouth daily.   levothyroxine 50 MCG tablet Commonly known as: SYNTHROID Take 50 mcg by mouth daily before breakfast.   montelukast 10 MG tablet Commonly known as: SINGULAIR Take 1 tablet by mouth daily.   potassium chloride SA 20 MEQ tablet Commonly known as: KLOR-CON M Take 1 tablet (20 mEq total) by mouth daily. Take along with hydrochlorothiazide   traMADol 50 MG tablet Commonly known as: ULTRAM Take 1 tablet (50 mg total) by mouth every 12 (twelve) hours as needed.   TYLENOL PO Take 2 tablets by mouth daily as needed (for pain or headache).   VITAMIN D3 PO Take by mouth daily.   zolpidem 10 MG tablet Commonly known as: AMBIEN Take 10 mg by mouth at bedtime as needed for sleep.        Follow-up Information     Ronnette Juniper, MD. Schedule an appointment as soon as possible for a visit in 6 week(s).   Specialty: Gastroenterology Why: Follow-up for rectal bleeding and colitis Contact information: [place] [address] [phone]                Discharge Exam: Danley Danker Weights   10/28/22 9629  Weight: 89.8 kg   General-appears in no acute distress Heart-S1-S2, regular, no murmur auscultated Lungs-clear to auscultation bilaterally, no wheezing or crackles auscultated Abdomen-soft, nontender, no organomegaly Extremities-no edema in the lower extremities Neuro-alert, oriented x3, no focal deficit noted  Condition at discharge: good  The results of significant diagnostics from this hospitalization (including imaging,  microbiology, ancillary and laboratory) are listed below for reference.   Imaging Studies: CT ANGIO GI BLEED  Result Date: 10/28/2022 CLINICAL DATA:  Lower gastrointestinal bleeding, colitis on earlier CT EXAM: CTA ABDOMEN AND PELVIS WITHOUT AND WITH CONTRAST TECHNIQUE: Multidetector CT imaging of the abdomen and pelvis was performed using the standard protocol during bolus administration of intravenous contrast. Multiplanar reconstructed images and MIPs were obtained and reviewed to evaluate the vascular anatomy. RADIATION DOSE REDUCTION: This exam was performed according to the departmental dose-optimization program which includes automated exposure control, adjustment of the mA and/or kV according to patient size and/or use of iterative reconstruction technique. CONTRAST:  67m OMNIPAQUE IOHEXOL 350 MG/ML SOLN COMPARISON:  10/28/2022 at 11:07 a.m. FINDINGS: VASCULAR Aorta: Normal caliber aorta without aneurysm, dissection, vasculitis or significant stenosis. Mild atherosclerosis. Celiac: Patent without evidence of aneurysm, dissection, vasculitis or significant stenosis. SMA: Patent without evidence of aneurysm,  dissection, vasculitis or significant stenosis. Renals: There are 2 left renal arteries and a single right renal artery. The bilateral renal arteries are patent without evidence of aneurysm, dissection, vasculitis, fibromuscular dysplasia or significant stenosis. 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 No aneurysm, dissection, or significant stenosis. Inflow: Patent without evidence of aneurysm, dissection, vasculitis or significant stenosis. Proximal Outflow: Bilateral common femoral and visualized portions of the superficial and profunda femoral arteries are patent without evidence of aneurysm, dissection, vasculitis or significant stenosis. Veins: No obvious venous abnormality within the  limitations of this arterial phase study. Review of the MIP images confirms the above findings. NON-VASCULAR Lower chest: No acute pleural or parenchymal lung disease. Hepatobiliary: Hepatic steatosis. No focal liver abnormality. Cholecystectomy. No biliary duct dilation. Pancreas: Unremarkable. No pancreatic ductal dilatation or surrounding inflammatory changes. Spleen: Normal in size without focal abnormality. Adrenals/Urinary Tract: Adrenal glands are unremarkable. Kidneys are normal, without renal calculi, focal lesion, or hydronephrosis. Bladder is unremarkable. Stomach/Bowel: There is wall thickening of the descending and proximal sigmoid colon, with mild pericolonic fat stranding, consistent with inflammatory or infectious colitis. Scattered diverticulosis of the rectosigmoid colon unchanged. Normal retrocecal appendix. No bowel obstruction or ileus. Small hiatal hernia. No intraluminal accumulation of contrast to suggest active gastrointestinal bleeding. Lymphatic: No pathologic adenopathy. Reproductive: Status post hysterectomy. No adnexal masses. Other: No free fluid or free intraperitoneal gas. No abdominal wall hernia. Musculoskeletal: No acute or destructive bony lesions. Reconstructed images demonstrate no additional findings. IMPRESSION: VASCULAR 1. No evidence of active gastrointestinal bleeding. 2.  Aortic Atherosclerosis (ICD10-I70.0). NON-VASCULAR 1. Mural thickening and pericolonic fat stranding involving the descending and proximal sigmoid colon, consistent with inflammatory or infectious colitis. 2. Rectosigmoid diverticulosis without diverticulitis. 3. Hepatic steatosis. 4. Small hiatal hernia. Electronically Signed   By: Randa Ngo M.D.   On: 10/28/2022 20:53   CT ABDOMEN PELVIS W CONTRAST  Result Date: 10/28/2022 CLINICAL DATA:  Left lower quadrant pain. Rectal bright red blood beginning this morning. EXAM: CT ABDOMEN AND PELVIS WITH CONTRAST TECHNIQUE: Multidetector CT imaging of  the abdomen and pelvis was performed using the standard protocol following bolus administration of intravenous contrast. RADIATION DOSE REDUCTION: This exam was performed according to the departmental dose-optimization program which includes automated exposure control, adjustment of the mA and/or kV according to patient size and/or use of iterative reconstruction technique. CONTRAST:  74m OMNIPAQUE IOHEXOL 300 MG/ML  SOLN COMPARISON:  08/06/2022 FINDINGS: Lower chest: Lung bases are clear. Minimal calcified plaque over the descending thoracic aorta. Small sliding hiatal hernia present with surgical clips adjacent the hiatal hernia/gastroesophageal junction. Hepatobiliary: Minimal diffuse decreased attenuation of the liver likely a mild degree of steatosis without focal mass. Previous cholecystectomy. Biliary tree is normal. Pancreas: Normal. Spleen: Normal. Adrenals/Urinary Tract: Adrenal glands are normal. Kidneys are normal size. No evidence of hydronephrosis, focal mass or nephrolithiasis. Ureters and bladder are normal. Stomach/Bowel: Small hiatal hernia with surgical clips adjacent the hernia and gastroesophageal junction as described above. Stomach is otherwise unremarkable. Small bowel is normal. Appendix is normal. Mild decompression of the descending colon with minimal submucosal edema as findings could be seen in a mild acute colitis of infectious or inflammatory nature. Mild diverticulosis of the sigmoid colon without active inflammation. Vascular/Lymphatic: Minimal calcified plaque over the abdominal aorta which is normal caliber. Remaining vascular structures are unremarkable. No adenopathy. Reproductive: Previous hysterectomy. Other: No significant free fluid.  No free  peritoneal air. Musculoskeletal: No focal abnormality. IMPRESSION: 1. Mild decompression of the descending colon with minimal submucosal edema as findings may be due to a mild acute colitis of infectious or inflammatory nature. 2. Mild  diverticulosis of the sigmoid colon without active inflammation. 3. Mild hepatic steatosis without focal mass. 4. Small sliding hiatal hernia with surgical clips adjacent the hiatal hernia/gastroesophageal junction. 5. Aortic atherosclerosis. Aortic Atherosclerosis (ICD10-I70.0). Electronically Signed   By: Marin Olp M.D.   On: 10/28/2022 11:18    Microbiology: Results for orders placed or performed during the hospital encounter of 08/24/22  Resp Panel by RT-PCR (Flu A&B, Covid) Anterior Nasal Swab     Status: None   Collection Time: 08/24/22  3:56 PM   Specimen: Anterior Nasal Swab  Result Value Ref Range Status   SARS Coronavirus 2 by RT PCR NEGATIVE NEGATIVE Final    Comment: (NOTE) SARS-CoV-2 target nucleic acids are NOT DETECTED.  The SARS-CoV-2 RNA is generally detectable in upper respiratory specimens during the acute phase of infection. The lowest concentration of SARS-CoV-2 viral copies this assay can detect is 138 copies/mL. A negative result does not preclude SARS-Cov-2 infection and should not be used as the sole basis for treatment or other patient management decisions. A negative result may occur with  improper specimen collection/handling, submission of specimen other than nasopharyngeal swab, presence of viral mutation(s) within the areas targeted by this assay, and inadequate number of viral copies(<138 copies/mL). A negative result must be combined with clinical observations, patient history, and epidemiological information. The expected result is Negative.  Fact Sheet for Patients:  EntrepreneurPulse.com.au  Fact Sheet for Healthcare Providers:  IncredibleEmployment.be  This test is no t yet approved or cleared by the [place] FDA and  has been authorized for detection and/or diagnosis of SARS-CoV-2 by FDA under an Emergency Use Authorization (EUA). This EUA will remain  in effect (meaning this test can be used) for the  duration of the COVID-19 declaration under Section 564(b)(1) of the Act, 21 U.S.C.section 360bbb-3(b)(1), unless the authorization is terminated  or revoked sooner.       Influenza A by PCR NEGATIVE NEGATIVE Final   Influenza B by PCR NEGATIVE NEGATIVE Final    Comment: (NOTE) The Xpert Xpress SARS-CoV-2/FLU/RSV plus assay is intended as an aid in the diagnosis of influenza from Nasopharyngeal swab specimens and should not be used as a sole basis for treatment. Nasal washings and aspirates are unacceptable for Xpert Xpress SARS-CoV-2/FLU/RSV testing.  Fact Sheet for Patients: EntrepreneurPulse.com.au  Fact Sheet for Healthcare Providers: IncredibleEmployment.be  This test is not yet approved or cleared by the [place] FDA and has been authorized for detection and/or diagnosis of SARS-CoV-2 by FDA under an Emergency Use Authorization (EUA). This EUA will remain in effect (meaning this test can be used) for the duration of the COVID-19 declaration under Section 564(b)(1) of the Act, 21 U.S.C. section 360bbb-3(b)(1), unless the authorization is terminated or revoked.  Performed at [facility], [place] [address], [place], [place] [address]     Labs: CBC: Recent Labs  Lab 10/28/22 0958 10/28/22 1600 10/28/22 1927 10/29/22 0502  WBC 13.9*  --  13.5* 10.1  NEUTROABS 10.7*  --   --   --   HGB 14.6 13.8 14.1 12.7  HCT 42.2 39.9 40.9 38.1  MCV 90.2  --  89.7 91.8  PLT 246  --  236 680   Basic Metabolic Panel: Recent Labs  Lab 10/28/22 0958 10/29/22 0502  10/29/22 1106 10/29/22 1721  NA 142 137  --   --   K 3.3* 3.0*  --  3.8  CL 104 103  --   --   CO2 29 27  --   --   GLUCOSE 146* 121*  --   --   BUN 16 12  --   --   CREATININE 0.76 0.80  --   --   CALCIUM 9.7 8.5*  --   --   MG  --   --  1.7  --    Liver Function Tests: Recent Labs  Lab 10/28/22 0958  AST 27  ALT 43  ALKPHOS 83  BILITOT 0.6  PROT 7.4   ALBUMIN 4.4   CBG: No results for input(s): "GLUCAP" in the last 168 hours.  Discharge time spent: greater than 30 minutes.  Signed: Oswald Hillock, MD [facility] 10/29/2022

## 2022-10-29 NOTE — Progress Notes (Signed)
Mobility Specialist - Progress Note   10/29/22 1226  Mobility  Activity Ambulated with assistance in hallway  Level of Assistance Modified independent, requires aide device or extra time  Assistive Device None  Distance Ambulated (ft) 400 ft  Activity Response Tolerated well  Mobility Referral Yes  $Mobility charge 1 Mobility   Pt received in bed and agreed to mobility, had no c/o pain nor discomfort during session. Pt returned to bed with all needs met and staff in room.   Roderick Pee Mobility Specialist

## 2022-11-13 DIAGNOSIS — E876 Hypokalemia: Secondary | ICD-10-CM | POA: Diagnosis not present

## 2022-11-13 DIAGNOSIS — R110 Nausea: Secondary | ICD-10-CM | POA: Diagnosis not present

## 2022-11-13 DIAGNOSIS — K529 Noninfective gastroenteritis and colitis, unspecified: Secondary | ICD-10-CM | POA: Diagnosis not present

## 2022-11-13 DIAGNOSIS — K625 Hemorrhage of anus and rectum: Secondary | ICD-10-CM | POA: Diagnosis not present

## 2022-11-13 DIAGNOSIS — I700 Atherosclerosis of aorta: Secondary | ICD-10-CM | POA: Diagnosis not present

## 2022-12-04 ENCOUNTER — Telehealth: Payer: Self-pay | Admitting: Physical Medicine and Rehabilitation

## 2022-12-04 NOTE — Telephone Encounter (Signed)
Patient wanting to make an appointment with Dr. Ernestina Patches for an injection.Marland Kitchen

## 2022-12-04 NOTE — Telephone Encounter (Signed)
Spoke with patient and scheduled OV for 12/05/22

## 2022-12-05 ENCOUNTER — Ambulatory Visit (INDEPENDENT_AMBULATORY_CARE_PROVIDER_SITE_OTHER): Payer: PPO | Admitting: Physical Medicine and Rehabilitation

## 2022-12-05 ENCOUNTER — Encounter: Payer: Self-pay | Admitting: Physical Medicine and Rehabilitation

## 2022-12-05 DIAGNOSIS — M7918 Myalgia, other site: Secondary | ICD-10-CM

## 2022-12-05 DIAGNOSIS — M5442 Lumbago with sciatica, left side: Secondary | ICD-10-CM

## 2022-12-05 DIAGNOSIS — M5416 Radiculopathy, lumbar region: Secondary | ICD-10-CM | POA: Diagnosis not present

## 2022-12-05 DIAGNOSIS — G8929 Other chronic pain: Secondary | ICD-10-CM

## 2022-12-05 NOTE — Progress Notes (Signed)
[name] - 77 y.o. female MRN [id number]  Date of birth: 11-08-1945  Office Visit Note: Visit Date: 12/05/2022 PCP: Ollen Bowl, MD Referred by: Ollen Bowl, MD  Subjective: Chief Complaint  Patient presents with   Lower Back - Pain   HPI: [name] is a 77 y.o. female who comes in today for evaluation of chronic, worsening and severe left sided lower back pain to buttock. Pain ongoing for several years and worsens with movement and activity. She describes pain as sore and aching, currently rates as 8 out of 10. Some relief of pain with home exercise regimen, heat/ice, rest and use of medications. Does take Flexeril intermittently. Lumbar MRI from 2022 exhibits stable lumbar spine degeneration that is less than typical for age, no evidence of neural impingement/spinal canal stenosis. History of intermittent lumbar epidural steroid injections in our office over the years with significant relief of pain for several months. Patient states she has family coming in from our of town next week and has travel plans to [place] coming up. She would like to have injection as soon as possible. Patient denies focal weakness, numbness and tingling. No recent trauma or falls.      Review of Systems  Musculoskeletal:  Positive for back pain.  Neurological:  Negative for tingling, sensory change, focal weakness and weakness.  All other systems reviewed and are negative.  Otherwise per HPI.  Assessment & Plan: Visit Diagnoses:    ICD-10-CM   1. Chronic left-sided low back pain with left-sided sciatica  M54.42 DG INJECT DIAG/THERA/INC NEEDLE/CATH/PLC EPI/LUMB/SAC W/IMG   G89.29     2. Lumbar radiculopathy  M54.16 DG INJECT DIAG/THERA/INC NEEDLE/CATH/PLC EPI/LUMB/SAC W/IMG    3. Left buttock pain  M79.18 DG INJECT DIAG/THERA/INC NEEDLE/CATH/PLC EPI/LUMB/SAC W/IMG       Plan: Findings:  Chronic, worsening and severe left sided lower back pain radiating to buttock. Patient  continues to have severe pain despite good conservative therapies such as home exercise regimen, rest and use of medications. Patients clinical presentation and exam are complex as her symptoms do not directly correlate with previous lumbar MRI imaging. Her pain pattern is consistent with L5 nerve distribution. Next step is to perform diagnostic and hopefully therapeutic left L5-S1 interlaminar epidural steroid injection under fluoroscopic guidance. Patient is requesting this injection be performed quickly. I will place order for her to have injection at [facility]. She will need to contact [facility] with any questions regarding insurance approval for procedure. We are happy to see her back as needed. No red flag symptoms noted upon exam today.     Meds & Orders: No orders of the defined types were placed in this encounter.   Orders Placed This Encounter  Procedures   DG INJECT DIAG/THERA/INC NEEDLE/CATH/PLC EPI/LUMB/SAC W/IMG    Follow-up: Return for Left L5-S1 interlaminar epidural steroid injection ([facility]).   Procedures: No procedures performed      Clinical History: EXAM: MRI LUMBAR SPINE WITHOUT CONTRAST   TECHNIQUE: Multiplanar, multisequence MR imaging of the lumbar spine was performed. No intravenous contrast was administered.   COMPARISON:  12/23/2013   FINDINGS: Segmentation:  5 lumbar type vertebrae   Alignment:  Physiologic.   Vertebrae: No fracture, evidence of discitis, or aggressive bone lesion.   Conus medullaris and cauda equina: Conus extends to the T12-L1 level. Conus and cauda equina appear normal.   Paraspinal and other soft tissues: No evidence of perispinal mass or inflammation   Disc levels:  Well preserved disc height and hydration for age. No advanced or progressive degenerative findings. Minor generalized disc bulging with L4-5 annular fissure. The canal and foramina are diffusely patent. Negative facets.   IMPRESSION: Stable  lumbar spine degeneration that is less than typical for age. No neural impingement or visible inflammation.     Electronically Signed   By: Tiburcio Pea M.D.   On: 06/26/2021 10:20   She reports that she has never smoked. She has never used smokeless tobacco. No results for input(s): "HGBA1C", "LABURIC" in the last 8760 hours.  Objective:  VS:  HT:    WT:   BMI:     BP:   HR: bpm  TEMP: ( )  RESP:  Physical Exam Vitals and nursing note reviewed.  HENT:     Head: Normocephalic and atraumatic.     Right Ear: External ear normal.     Left Ear: External ear normal.     Nose: Nose normal.     Mouth/Throat:     Mouth: Mucous membranes are moist.  Eyes:     Extraocular Movements: Extraocular movements intact.  Cardiovascular:     Rate and Rhythm: Normal rate.     Pulses: Normal pulses.  Pulmonary:     Effort: Pulmonary effort is normal.  Abdominal:     General: Abdomen is flat. There is no distension.  Musculoskeletal:        General: Tenderness present.     Cervical back: Normal range of motion.     Comments: Pt rises from seated position to standing without difficulty. Good lumbar range of motion. Strong distal strength without clonus, no pain upon palpation of greater trochanters. Sensation intact bilaterally. Dysesthesias noted to left L5 dermatome. Walks independently, gait steady.   Skin:    General: Skin is warm and dry.     Capillary Refill: Capillary refill takes less than 2 seconds.  Neurological:     General: No focal deficit present.     Mental Status: She is alert and oriented to person, place, and time.  Psychiatric:        Mood and Affect: Mood normal.        Behavior: Behavior normal.     Ortho Exam  Imaging: No results found.  Past Medical/Family/Surgical/Social History: Medications & Allergies reviewed per EMR, new medications updated. 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 05/15/2022   Dyslipidemia 07/27/2019   Essential hypertension 07/27/2019   Upper GI bleed 07/27/2019   Diverticulitis 07/27/2019   Gastritis 07/27/2019   Melena 07/23/2019   Concussion with loss of consciousness 06/02/2014   Left-sided weakness 05/28/2014   Syncope and collapse 05/28/2014   Scalp contusion 05/28/2014   Hypertension    Past Medical History:  Diagnosis Date   Depression    Hypertension    IBS (irritable bowel syndrome)    Malignant melanoma (HCC)    Family History  Problem Relation Age of Onset   Heart attack Mother    Hypertension Mother    Cancer Father    Diabetes Father    Hypertension Father    Breast cancer Paternal Grandfather    Past Surgical History:  Procedure Laterality Date   BLADDER REPAIR     ESOPHAGOGASTRODUODENOSCOPY (EGD) WITH PROPOFOL N/A 07/24/2019  Procedure: ESOPHAGOGASTRODUODENOSCOPY (EGD) WITH PROPOFOL;  Surgeon: Charlott Rakes, MD;  Location: [facility];  Service: Endoscopy;  Laterality: N/A;   FOOT SURGERY Bilateral    HERNIA REPAIR     MELANOMA EXCISION     VAGINAL HYSTERECTOMY     Social History   Occupational History   Occupation: retired  Tobacco Use   Smoking status: Never   Smokeless tobacco: Never  Vaping Use   Vaping Use: Never used  Substance and Sexual Activity   Alcohol use: No   Drug use: No   Sexual activity: Not on file

## 2022-12-05 NOTE — Progress Notes (Unsigned)
Functional Pain Scale - descriptive words and definitions  Distressing (6)    Pain is present/unable to complete most ADLs limited by pain/sleep is difficult and active distraction is only marginal. Moderate range order  Average Pain 5-6  Lower back pain on left side. Knots in lower back on left side. Can have radiation into the left hip and leg

## 2022-12-09 ENCOUNTER — Ambulatory Visit
Admission: RE | Admit: 2022-12-09 | Discharge: 2022-12-09 | Disposition: A | Discharge status: Home / Self Care | Payer: PPO | Source: Ambulatory Visit | Attending: Physical Medicine and Rehabilitation | Admitting: Physical Medicine and Rehabilitation

## 2022-12-09 DIAGNOSIS — G8929 Other chronic pain: Secondary | ICD-10-CM

## 2022-12-09 DIAGNOSIS — M5416 Radiculopathy, lumbar region: Secondary | ICD-10-CM

## 2022-12-09 DIAGNOSIS — M545 Low back pain, unspecified: Secondary | ICD-10-CM | POA: Diagnosis not present

## 2022-12-09 DIAGNOSIS — M7918 Myalgia, other site: Secondary | ICD-10-CM

## 2022-12-09 MED ORDER — ACETAMINOPHEN 500 MG PO TABS
1000.0000 mg | ORAL_TABLET | Freq: Once | ORAL | Status: AC
  Administered 2022-12-09: 1000 mg via ORAL

## 2022-12-09 MED ORDER — METHYLPREDNISOLONE ACETATE 40 MG/ML INJ SUSP (RADIOLOG
80.0000 mg | Freq: Once | INTRAMUSCULAR | Status: AC
  Administered 2022-12-09: 80 mg via EPIDURAL

## 2022-12-09 MED ORDER — IOPAMIDOL (ISOVUE-M 200) INJECTION 41%
1.0000 mL | Freq: Once | INTRAMUSCULAR | Status: AC
  Administered 2022-12-09: 1 mL via EPIDURAL

## 2022-12-09 NOTE — Discharge Instructions (Signed)

## 2022-12-09 NOTE — Discharge Instr - Other Info (Signed)
0830: pt c/o severe headache after injection. Dr. Maree Erie ordered to give tylenol. See MAR.

## 2022-12-17 DIAGNOSIS — K625 Hemorrhage of anus and rectum: Secondary | ICD-10-CM | POA: Diagnosis not present

## 2022-12-17 DIAGNOSIS — Z8719 Personal history of other diseases of the digestive system: Secondary | ICD-10-CM | POA: Diagnosis not present

## 2022-12-29 DIAGNOSIS — H9313 Tinnitus, bilateral: Secondary | ICD-10-CM | POA: Diagnosis not present

## 2022-12-29 DIAGNOSIS — Z8719 Personal history of other diseases of the digestive system: Secondary | ICD-10-CM | POA: Diagnosis not present

## 2023-01-14 DIAGNOSIS — R933 Abnormal findings on diagnostic imaging of other parts of digestive tract: Secondary | ICD-10-CM | POA: Diagnosis not present

## 2023-01-14 DIAGNOSIS — D128 Benign neoplasm of rectum: Secondary | ICD-10-CM | POA: Diagnosis not present

## 2023-01-14 DIAGNOSIS — K5730 Diverticulosis of large intestine without perforation or abscess without bleeding: Secondary | ICD-10-CM | POA: Diagnosis not present

## 2023-01-14 DIAGNOSIS — K6389 Other specified diseases of intestine: Secondary | ICD-10-CM | POA: Diagnosis not present

## 2023-01-14 DIAGNOSIS — K648 Other hemorrhoids: Secondary | ICD-10-CM | POA: Diagnosis not present

## 2023-01-14 DIAGNOSIS — K625 Hemorrhage of anus and rectum: Secondary | ICD-10-CM | POA: Diagnosis not present

## 2023-01-14 DIAGNOSIS — D122 Benign neoplasm of ascending colon: Secondary | ICD-10-CM | POA: Diagnosis not present

## 2023-01-14 DIAGNOSIS — D123 Benign neoplasm of transverse colon: Secondary | ICD-10-CM | POA: Diagnosis not present

## 2023-01-16 DIAGNOSIS — D123 Benign neoplasm of transverse colon: Secondary | ICD-10-CM | POA: Diagnosis not present

## 2023-02-18 ENCOUNTER — Ambulatory Visit
Admission: RE | Admit: 2023-02-18 | Discharge: 2023-02-18 | Disposition: A | Discharge status: Home / Self Care | Payer: PPO | Source: Ambulatory Visit | Attending: Internal Medicine | Admitting: Internal Medicine

## 2023-02-18 ENCOUNTER — Other Ambulatory Visit: Payer: Self-pay | Admitting: Internal Medicine

## 2023-02-18 DIAGNOSIS — I10 Essential (primary) hypertension: Secondary | ICD-10-CM | POA: Diagnosis not present

## 2023-02-18 DIAGNOSIS — W19XXXA Unspecified fall, initial encounter: Secondary | ICD-10-CM | POA: Diagnosis not present

## 2023-02-18 DIAGNOSIS — R58 Hemorrhage, not elsewhere classified: Secondary | ICD-10-CM | POA: Diagnosis not present

## 2023-02-18 DIAGNOSIS — M545 Low back pain, unspecified: Secondary | ICD-10-CM | POA: Diagnosis not present

## 2023-02-18 DIAGNOSIS — M533 Sacrococcygeal disorders, not elsewhere classified: Secondary | ICD-10-CM | POA: Diagnosis not present

## 2023-03-13 DIAGNOSIS — E782 Mixed hyperlipidemia: Secondary | ICD-10-CM | POA: Diagnosis not present

## 2023-03-13 DIAGNOSIS — F411 Generalized anxiety disorder: Secondary | ICD-10-CM | POA: Diagnosis not present

## 2023-03-13 DIAGNOSIS — Z8719 Personal history of other diseases of the digestive system: Secondary | ICD-10-CM | POA: Diagnosis not present

## 2023-03-13 DIAGNOSIS — R5383 Other fatigue: Secondary | ICD-10-CM | POA: Diagnosis not present

## 2023-03-13 DIAGNOSIS — Z0000 Encounter for general adult medical examination without abnormal findings: Secondary | ICD-10-CM | POA: Diagnosis not present

## 2023-03-13 DIAGNOSIS — E039 Hypothyroidism, unspecified: Secondary | ICD-10-CM | POA: Diagnosis not present

## 2023-03-13 DIAGNOSIS — I493 Ventricular premature depolarization: Secondary | ICD-10-CM | POA: Diagnosis not present

## 2023-03-13 DIAGNOSIS — J309 Allergic rhinitis, unspecified: Secondary | ICD-10-CM | POA: Diagnosis not present

## 2023-03-13 DIAGNOSIS — I10 Essential (primary) hypertension: Secondary | ICD-10-CM | POA: Diagnosis not present

## 2023-03-13 DIAGNOSIS — I700 Atherosclerosis of aorta: Secondary | ICD-10-CM | POA: Diagnosis not present

## 2023-03-13 DIAGNOSIS — N951 Menopausal and female climacteric states: Secondary | ICD-10-CM | POA: Diagnosis not present

## 2023-03-13 DIAGNOSIS — Z9181 History of falling: Secondary | ICD-10-CM | POA: Diagnosis not present

## 2023-03-23 ENCOUNTER — Emergency Department (HOSPITAL_BASED_OUTPATIENT_CLINIC_OR_DEPARTMENT_OTHER)
Admission: EM | Admit: 2023-03-23 | Discharge: 2023-03-23 | Disposition: A | Discharge status: Home / Self Care | Payer: PPO | Attending: Emergency Medicine | Admitting: Emergency Medicine

## 2023-03-23 ENCOUNTER — Other Ambulatory Visit: Payer: Self-pay

## 2023-03-23 ENCOUNTER — Emergency Department (HOSPITAL_BASED_OUTPATIENT_CLINIC_OR_DEPARTMENT_OTHER): Payer: PPO

## 2023-03-23 DIAGNOSIS — R109 Unspecified abdominal pain: Secondary | ICD-10-CM | POA: Diagnosis present

## 2023-03-23 DIAGNOSIS — I10 Essential (primary) hypertension: Secondary | ICD-10-CM | POA: Diagnosis not present

## 2023-03-23 DIAGNOSIS — K5900 Constipation, unspecified: Secondary | ICD-10-CM | POA: Insufficient documentation

## 2023-03-23 DIAGNOSIS — K6389 Other specified diseases of intestine: Secondary | ICD-10-CM | POA: Diagnosis not present

## 2023-03-23 DIAGNOSIS — K529 Noninfective gastroenteritis and colitis, unspecified: Secondary | ICD-10-CM | POA: Diagnosis not present

## 2023-03-23 DIAGNOSIS — K760 Fatty (change of) liver, not elsewhere classified: Secondary | ICD-10-CM | POA: Diagnosis not present

## 2023-03-23 LAB — CBC WITH DIFFERENTIAL/PLATELET
Abs Immature Granulocytes: 0.07 10*3/uL (ref 0.00–0.07)
Basophils Absolute: 0.1 10*3/uL (ref 0.0–0.1)
Basophils Relative: 1 %
Eosinophils Absolute: 0.3 10*3/uL (ref 0.0–0.5)
Eosinophils Relative: 2 %
HCT: 40.7 % (ref 36.0–46.0)
Hemoglobin: 14.4 g/dL (ref 12.0–15.0)
Immature Granulocytes: 1 %
Lymphocytes Relative: 20 %
Lymphs Abs: 2.8 10*3/uL (ref 0.7–4.0)
MCH: 31.8 pg (ref 26.0–34.0)
MCHC: 35.4 g/dL (ref 30.0–36.0)
MCV: 89.8 fL (ref 80.0–100.0)
Monocytes Absolute: 1.1 10*3/uL — ABNORMAL HIGH (ref 0.1–1.0)
Monocytes Relative: 8 %
Neutro Abs: 9.5 10*3/uL — ABNORMAL HIGH (ref 1.7–7.7)
Neutrophils Relative %: 68 %
Platelets: 245 10*3/uL (ref 150–400)
RBC: 4.53 MIL/uL (ref 3.87–5.11)
RDW: 12.9 % (ref 11.5–15.5)
WBC: 13.7 10*3/uL — ABNORMAL HIGH (ref 4.0–10.5)
nRBC: 0 % (ref 0.0–0.2)

## 2023-03-23 LAB — COMPREHENSIVE METABOLIC PANEL
ALT: 19 U/L (ref 0–44)
AST: 15 U/L (ref 15–41)
Albumin: 4 g/dL (ref 3.5–5.0)
Alkaline Phosphatase: 72 U/L (ref 38–126)
Anion gap: 11 (ref 5–15)
BUN: 11 mg/dL (ref 8–23)
CO2: 24 mmol/L (ref 22–32)
Calcium: 9.1 mg/dL (ref 8.9–10.3)
Chloride: 104 mmol/L (ref 98–111)
Creatinine, Ser: 0.69 mg/dL (ref 0.44–1.00)
GFR, Estimated: >60 mL/min (ref >60)
Glucose, Bld: 121 mg/dL — ABNORMAL HIGH (ref 70–99)
Potassium: 3.5 mmol/L (ref 3.5–5.1)
Sodium: 139 mmol/L (ref 135–145)
Total Bilirubin: 1.2 mg/dL (ref 0.3–1.2)
Total Protein: 6.6 g/dL (ref 6.5–8.1)

## 2023-03-23 LAB — URINALYSIS, ROUTINE W REFLEX MICROSCOPIC
Bilirubin Urine: NEGATIVE
Glucose, UA: NEGATIVE mg/dL
Hgb urine dipstick: NEGATIVE
Leukocytes,Ua: NEGATIVE
Nitrite: NEGATIVE
Specific Gravity, Urine: 1.018 (ref 1.005–1.030)
pH: 6 (ref 5.0–8.0)

## 2023-03-23 LAB — LIPASE, BLOOD: Lipase: 11 U/L (ref 11–51)

## 2023-03-23 MED ORDER — ONDANSETRON HCL 4 MG/2ML IJ SOLN
4.0000 mg | Freq: Once | INTRAMUSCULAR | Status: AC
  Administered 2023-03-23: 4 mg via INTRAVENOUS
  Filled 2023-03-23: qty 2

## 2023-03-23 MED ORDER — OXYCODONE HCL 5 MG PO TABS
5.0000 mg | ORAL_TABLET | Freq: Once | ORAL | Status: AC
  Administered 2023-03-23: 5 mg via ORAL
  Filled 2023-03-23: qty 1

## 2023-03-23 MED ORDER — LACTATED RINGERS IV BOLUS
1000.0000 mL | Freq: Once | INTRAVENOUS | Status: AC
  Administered 2023-03-23: 1000 mL via INTRAVENOUS

## 2023-03-23 MED ORDER — POLYETHYLENE GLYCOL 3350 17 G PO PACK: 17.0000 g | PACK | Freq: Every day | ORAL | 0 refills | Status: AC

## 2023-03-23 MED ORDER — AMOXICILLIN-POT CLAVULANATE 875-125 MG PO TABS: 1.0000 | ORAL_TABLET | Freq: Two times a day (BID) | ORAL | 0 refills | Status: DC

## 2023-03-23 MED ORDER — IOHEXOL 300 MG/ML  SOLN
100.0000 mL | Freq: Once | INTRAMUSCULAR | Status: AC | PRN
  Administered 2023-03-23: 75 mL via INTRAVENOUS

## 2023-03-23 MED ORDER — DICYCLOMINE HCL 20 MG PO TABS: 20.0000 mg | ORAL_TABLET | Freq: Two times a day (BID) | ORAL | 0 refills | Status: AC

## 2023-03-23 MED ORDER — FENTANYL CITRATE PF 50 MCG/ML IJ SOSY
50.0000 ug | PREFILLED_SYRINGE | INTRAMUSCULAR | Status: DC | PRN
  Administered 2023-03-23: 50 ug via INTRAVENOUS
  Filled 2023-03-23: qty 1

## 2023-03-23 MED ORDER — AMOXICILLIN-POT CLAVULANATE 875-125 MG PO TABS
1.0000 | ORAL_TABLET | Freq: Once | ORAL | Status: AC
  Administered 2023-03-23: 1 via ORAL
  Filled 2023-03-23: qty 1

## 2023-03-23 NOTE — ED Notes (Signed)
RN reviewed discharge instructions with pt. Pt verbalized understanding and had no further questions. VSS upon discharge.  

## 2023-03-23 NOTE — ED Triage Notes (Signed)
Patient here POV from Home.  Endorses Constipation over 6 Days ago. Had an episode of Diarrhea 4 Days ago and since then none. Had a Tiny BM today with Suppository Use.   No N/V. 100.3 temperature today. Tylenol (500 mg) an hour ago. Lower ABD Pain.   NAD Noted during Triage. A&Ox4. Gcs 15. Ambulatory.

## 2023-03-23 NOTE — ED Provider Notes (Signed)
[place] EMERGENCY DEPARTMENT AT [facility] Provider Note   CSN: [id number] Arrival date & time: 03/23/23  1934     History Chief Complaint  Patient presents with   Constipation    HPI [name] is a 77 y.o. female presenting for chief complaint of abdominal pain.  77 year old female states that she has had abdominal pain over the last 72 hours.  Endorses a history diverticulitis that felt similar though this feels more restrictive in her bowel movements.  States that she feels like she is having only small volume bowel movements despite  Dulcolax suppositories. Ambulatory tolerating p.o. intake.  Patient's recorded medical, surgical, social, medication list and allergies were reviewed in the Snapshot window as part of the initial history.   Review of Systems   Review of Systems  Constitutional:  Negative for chills and fever.  HENT:  Negative for ear pain and sore throat.   Eyes:  Negative for pain and visual disturbance.  Respiratory:  Negative for cough and shortness of breath.   Cardiovascular:  Negative for chest pain and palpitations.  Gastrointestinal:  Positive for abdominal pain and nausea. Negative for vomiting.  Genitourinary:  Negative for dysuria and hematuria.  Musculoskeletal:  Negative for arthralgias and back pain.  Skin:  Negative for color change and rash.  Neurological:  Negative for seizures and syncope.  All other systems reviewed and are negative.   Physical Exam Updated Vital Signs BP 139/64   Pulse 63   Temp 99.2 F (37.3 C) (Oral)   Resp 10   Ht 5\' 5"  (1.651 m)   Wt 83.5 kg   SpO2 90%   BMI 30.62 kg/m  Physical Exam Vitals and nursing note reviewed.  Constitutional:      General: She is not in acute distress.    Appearance: She is well-developed.  HENT:     Head: Normocephalic and atraumatic.  Eyes:     Conjunctiva/sclera: Conjunctivae normal.  Cardiovascular:     Rate and Rhythm: Normal rate and regular rhythm.      Heart sounds: No murmur heard. Pulmonary:     Effort: Pulmonary effort is normal. No respiratory distress.     Breath sounds: Normal breath sounds.  Abdominal:     General: There is no distension.     Palpations: Abdomen is soft.     Tenderness: There is abdominal tenderness. There is no right CVA tenderness or left CVA tenderness.  Musculoskeletal:        General: No swelling or tenderness. Normal range of motion.     Cervical back: Neck supple.  Skin:    General: Skin is warm and dry.  Neurological:     General: No focal deficit present.     Mental Status: She is alert and oriented to person, place, and time. Mental status is at baseline.     Cranial Nerves: No cranial nerve deficit.      ED Course/ Medical Decision Making/ A&P    Procedures Procedures   Medications Ordered in ED Medications  fentaNYL (SUBLIMAZE) injection 50 mcg (50 mcg Intravenous Given 03/23/23 2046)  amoxicillin-clavulanate (AUGMENTIN) 875-125 MG per tablet 1 tablet (has no administration in time range)  oxyCODONE (Oxy IR/ROXICODONE) immediate release tablet 5 mg (has no administration in time range)  ondansetron (ZOFRAN) injection 4 mg (4 mg Intravenous Given 03/23/23 2046)  lactated ringers bolus 1,000 mL (1,000 mLs Intravenous New Bag/Given 03/23/23 2112)  iohexol (OMNIPAQUE) 300 MG/ML solution 100 mL (75 mLs Intravenous Contrast  Given 03/23/23 2051)   Medical Decision Making:   [name] is a 77 y.o. female who presented to the ED today with abdominal pain, detailed above.    Patient's presentation is complicated by their history of multiple comorbid medical problems.  Patient placed on continuous vitals and telemetry monitoring while in ED which was reviewed periodically.  Complete initial physical exam performed, notably the patient  was hemodynamically stable no acute distress.     Reviewed and confirmed nursing documentation for past medical history, family history, social history.     Initial Assessment:   With the patient's presentation of abdominal pain, most likely diagnosis is nonspecific etiology. Other diagnoses were considered including (but not limited to) gastroenteritis, colitis, small bowel obstruction, appendicitis, cholecystitis, pancreatitis, nephrolithiasis, UTI, pyleonephritis. These are considered less likely due to history of present illness and physical exam findings.   This is most consistent with an acute life/limb threatening illness complicated by underlying chronic conditions.   Initial Plan:  CBC/CMP to evaluate for underlying infectious/metabolic etiology for patient's abdominal pain  Lipase to evaluate for pancreatitis  EKG to evaluate for cardiac source of pain  CTAB/Pelvis with contrast to evaluate for structural/surgical etiology of patients' severe abdominal pain.  Urinalysis and repeat physical assessment to evaluate for UTI/Pyelonpehritis  Empiric management of symptoms with escalating pain control and antiemetics as needed.   Initial Study Results:   Laboratory  All laboratory results reviewed without evidence of clinically relevant pathology.   EKG EKG was reviewed independently. Rate, rhythm, axis, intervals all examined and without medically relevant abnormality. ST segments without concerns for elevations.    Radiology All images reviewed independently. Agree with radiology report at this time.   CT ABDOMEN PELVIS W CONTRAST  Result Date: 03/23/2023 CLINICAL DATA:  Abdominal/flank pain, stone suspected Abdominal pain, acute, nonlocalized EXAM: CT ABDOMEN AND PELVIS WITH CONTRAST TECHNIQUE: Multidetector CT imaging of the abdomen and pelvis was performed using the standard protocol following bolus administration of intravenous contrast. RADIATION DOSE REDUCTION: This exam was performed according to the departmental dose-optimization program which includes automated exposure control, adjustment of the mA and/or kV according to patient  size and/or use of iterative reconstruction technique. CONTRAST:  75mL OMNIPAQUE IOHEXOL 300 MG/ML  SOLN COMPARISON:  CT 10/28/2022 FINDINGS: Lower chest: Clear lung bases. Small hiatal hernia. Hepatobiliary: Diffuse hepatic steatosis. No focal liver abnormality. Clips in the gallbladder fossa postcholecystectomy. No biliary dilatation. Pancreas: No ductal dilatation or inflammation. Spleen: Normal in size without focal abnormality. Adrenals/Urinary Tract: Normal adrenal glands. Early excretion of IV contrast in the renal collecting systems. This obscures assessment for renal calculi peer no hydronephrosis. No suspicious renal abnormality. The urinary bladder is near completely empty. Stomach/Bowel: Moderate length segment of small bowel wall thickening with intraluminal fluid and mesenteric edema in the pelvis, for example series 2, image 66. No bowel pneumatosis. Colonic redundancy. There is no colonic wall thickening or inflammation. Mild distal colonic diverticulosis without diverticulitis. Small hiatal hernia with surgical clips at the gastroesophageal junction. Vascular/Lymphatic: Aortic atherosclerosis. No aortic aneurysm. Mesenteric arterial and venous vasculature is patent. No portal venous gas. No adenopathy. Reproductive: Status post hysterectomy. No adnexal masses. Other: Mesenteric edema with small amount of mesenteric free fluid. Minimal free fluid in the right pericolic gutter tracking into the pelvis. No free air. No focal fluid collection. Musculoskeletal: There are no acute or suspicious osseous abnormalities. IMPRESSION: 1. Moderate length segment of small bowel wall thickening with intraluminal fluid and mesenteric edema in the pelvis,  consistent with enteritis, likely infectious or inflammatory. Given patient previously had colitis, the possibility of inflammatory bowel disease such as Crohn's disease is raised. 2. No hydronephrosis or obstructive uropathy. 3. Hepatic steatosis. 4. Small hiatal  hernia. Mild distal colonic diverticulosis without diverticulitis. Aortic Atherosclerosis (ICD10-I70.0). Electronically Signed   By: Narda Rutherford M.D.   On: 03/23/2023 21:32    Final Reassessment and Plan:   Reassessed at bedside.  Patient has had interval improvement in her symptoms.  CT scan nondiagnostic, pain is under control.  White count is elevated, urine does have crystals but no evidence of nephrolithiasis on CT. Will treat patient for suspected colitis/enteritis with Augmentin, constipation treatment with MiraLAX and plan for patient follow-up with a gastroenterologist for further diagnostic care and management regarding potential inflammatory bowel disease. No acute indication for further intervention in the emergency room patient ambulatory tolerating p.o. intake with pain under control on reassessment.  Disposition:  I have considered need for hospitalization, however, considering all of the above, I believe this patient is stable for discharge at this time.  Patient/family educated about specific return precautions for given chief complaint and symptoms.  Patient/family educated about follow-up with PCP.     Patient/family expressed understanding of return precautions and need for follow-up. Patient spoken to regarding all imaging and laboratory results and appropriate follow up for these results. All education provided in verbal form with additional information in written form. Time was allowed for answering of patient questions. Patient discharged.    Emergency Department Medication Summary:   Medications  fentaNYL (SUBLIMAZE) injection 50 mcg (50 mcg Intravenous Given 03/23/23 2046)  amoxicillin-clavulanate (AUGMENTIN) 875-125 MG per tablet 1 tablet (has no administration in time range)  oxyCODONE (Oxy IR/ROXICODONE) immediate release tablet 5 mg (has no administration in time range)  ondansetron (ZOFRAN) injection 4 mg (4 mg Intravenous Given 03/23/23 2046)  lactated ringers  bolus 1,000 mL (1,000 mLs Intravenous New Bag/Given 03/23/23 2112)  iohexol (OMNIPAQUE) 300 MG/ML solution 100 mL (75 mLs Intravenous Contrast Given 03/23/23 2051)            Clinical Impression:  1. Enteritis      Discharge   Final Clinical Impression(s) / ED Diagnoses Final diagnoses:  Enteritis    Rx / DC Orders ED Discharge Orders          Ordered    amoxicillin-clavulanate (AUGMENTIN) 875-125 MG tablet  Every 12 hours        03/23/23 2247    dicyclomine (BENTYL) 20 MG tablet  2 times daily        03/23/23 2249    polyethylene glycol (MIRALAX) 17 g packet  Daily        03/23/23 2250              Glyn Ade, MD 03/23/23 2254

## 2023-03-30 ENCOUNTER — Telehealth: Payer: Self-pay | Admitting: Gastroenterology

## 2023-03-30 NOTE — Telephone Encounter (Signed)
Inbound call from patient wishing to transfer her care over to Dr. Adela Lank specifically from [facility], also stated she is okay wth being seen with [facility] as well. Wishing to transfer her care due to feeling she has not been helped or diagnosed for her diverticulitis. Will have records sent over from [place].

## 2023-04-08 ENCOUNTER — Telehealth: Payer: Self-pay | Admitting: Gastroenterology

## 2023-04-08 ENCOUNTER — Encounter: Payer: Self-pay | Admitting: Gastroenterology

## 2023-04-08 DIAGNOSIS — Z85820 Personal history of malignant melanoma of skin: Secondary | ICD-10-CM | POA: Diagnosis not present

## 2023-04-08 DIAGNOSIS — C44729 Squamous cell carcinoma of skin of left lower limb, including hip: Secondary | ICD-10-CM | POA: Diagnosis not present

## 2023-04-08 DIAGNOSIS — L821 Other seborrheic keratosis: Secondary | ICD-10-CM | POA: Diagnosis not present

## 2023-04-08 DIAGNOSIS — Z08 Encounter for follow-up examination after completed treatment for malignant neoplasm: Secondary | ICD-10-CM | POA: Diagnosis not present

## 2023-04-08 DIAGNOSIS — L538 Other specified erythematous conditions: Secondary | ICD-10-CM | POA: Diagnosis not present

## 2023-04-08 DIAGNOSIS — L820 Inflamed seborrheic keratosis: Secondary | ICD-10-CM | POA: Diagnosis not present

## 2023-04-08 DIAGNOSIS — D485 Neoplasm of uncertain behavior of skin: Secondary | ICD-10-CM | POA: Diagnosis not present

## 2023-04-08 DIAGNOSIS — L84 Corns and callosities: Secondary | ICD-10-CM | POA: Diagnosis not present

## 2023-04-08 DIAGNOSIS — L298 Other pruritus: Secondary | ICD-10-CM | POA: Diagnosis not present

## 2023-04-08 DIAGNOSIS — L814 Other melanin hyperpigmentation: Secondary | ICD-10-CM | POA: Diagnosis not present

## 2023-04-08 DIAGNOSIS — D225 Melanocytic nevi of trunk: Secondary | ICD-10-CM | POA: Diagnosis not present

## 2023-04-08 NOTE — Telephone Encounter (Signed)
I am happy to see her in the office for a new patient visit.  Thanks 

## 2023-04-08 NOTE — Telephone Encounter (Signed)
Hi Dr. Adela Lank,   Patient called requesting a transfer of care specifically over to you if possible, stated she has GI history with [facility] but is not happy there due to feeling she has not been helped or diagnosed for her diverticulitis. Her records were obtained and scanned into [employer] for you to review and advise on scheduling.    Thanks

## 2023-04-11 ENCOUNTER — Ambulatory Visit
Admission: EM | Admit: 2023-04-11 | Discharge: 2023-04-11 | Disposition: A | Discharge status: Home / Self Care | Payer: PPO | Attending: Family Medicine | Admitting: Family Medicine

## 2023-04-11 ENCOUNTER — Other Ambulatory Visit: Payer: Self-pay

## 2023-04-11 ENCOUNTER — Encounter: Payer: Self-pay | Admitting: Emergency Medicine

## 2023-04-11 DIAGNOSIS — T7840XA Allergy, unspecified, initial encounter: Secondary | ICD-10-CM | POA: Diagnosis not present

## 2023-04-11 DIAGNOSIS — W57XXXA Bitten or stung by nonvenomous insect and other nonvenomous arthropods, initial encounter: Secondary | ICD-10-CM | POA: Diagnosis not present

## 2023-04-11 DIAGNOSIS — S50861A Insect bite (nonvenomous) of right forearm, initial encounter: Secondary | ICD-10-CM

## 2023-04-11 MED ORDER — TRIAMCINOLONE ACETONIDE 0.1 % EX CREA: 1.0000 | TOPICAL_CREAM | Freq: Two times a day (BID) | CUTANEOUS | 0 refills | Status: DC

## 2023-04-11 MED ORDER — FLUCONAZOLE 150 MG PO TABS: 150.0000 mg | ORAL_TABLET | ORAL | 0 refills | Status: DC

## 2023-04-11 MED ORDER — CEPHALEXIN 500 MG PO CAPS: 500.0000 mg | ORAL_CAPSULE | Freq: Two times a day (BID) | ORAL | 0 refills | Status: DC

## 2023-04-11 NOTE — ED Triage Notes (Addendum)
Pt reports was playing with dog in backyard and states reached down to get a ball to throw and states itching and redness ever since. Pt reports marked site and states has "gotten worse since this morning." Has tried alcohol, benadryl, and hydrocortisone, neosporin with no change in redness/itching.  Moderate redness noted to right anterior forearm.

## 2023-04-11 NOTE — ED Provider Notes (Signed)
[facility]    CSN: [id number] Arrival date & time: 04/11/23  1239      History   Chief Complaint Chief Complaint  Patient presents with   Insect Bite    HPI [name] is a 77 y.o. female.   Patient presenting today with 1 day history of progressively worsening redness, swelling, warmth to the right forearm after an insect bite.  States the area has more than doubled in size overnight and is intensely itchy, sore.  Denies drainage from the area, fever, chills, body aches, difficulty breathing, palpitations.  So far has tried alcohol, Benadryl, hydrocortisone, Neosporin with no improvement.    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 05/15/2022   Dyslipidemia 07/27/2019   Essential hypertension 07/27/2019   Upper GI bleed 07/27/2019   Diverticulitis 07/27/2019   Gastritis 07/27/2019   Melena 07/23/2019   Concussion with loss of consciousness 06/02/2014   Left-sided weakness 05/28/2014   Syncope and collapse 05/28/2014   Scalp contusion 05/28/2014   Hypertension     Past Surgical History:  Procedure Laterality Date   BLADDER REPAIR     ESOPHAGOGASTRODUODENOSCOPY (EGD) WITH PROPOFOL N/A 07/24/2019   Procedure: ESOPHAGOGASTRODUODENOSCOPY (EGD) WITH PROPOFOL;  Surgeon: Charlott Rakes, MD;  Location: [facility];  Service: Endoscopy;  Laterality: N/A;   FOOT SURGERY Bilateral    HERNIA REPAIR     MELANOMA EXCISION     VAGINAL HYSTERECTOMY      OB History   No obstetric history on file.      Home Medications    Prior to Admission medications   Medication  Sig Start Date End Date Taking? Authorizing Provider  fluconazole (DIFLUCAN) 150 MG tablet Take 1 tablet (150 mg total) by mouth every other day. 04/11/23  Yes Particia Nearing, PA-C  Acetaminophen (TYLENOL PO) Take 2 tablets by mouth daily as needed (for pain or headache).    [provider]  albuterol (PROVENTIL) (2.5 MG/3ML) 0.083% nebulizer solution Take 3 mLs (2.5 mg total) by nebulization every 6 (six) hours as needed for wheezing or shortness of breath. 08/24/22   Leath-Warren, Sadie Haber, NP  ALPRAZolam Prudy Feeler) 1 MG tablet Take 2 mg by mouth 2 (two) times daily as needed for anxiety.  05/16/14   [provider]  amLODipine (NORVASC) 5 MG tablet Take 5 mg by mouth daily. Pt takes at night 09/01/22   [provider]  amoxicillin-clavulanate (AUGMENTIN) 875-125 MG tablet Take 1 tablet by mouth every 12 (twelve) hours. 03/23/23   Glyn Ade, MD  atorvastatin (LIPITOR) 40 MG tablet Take 40 mg by mouth 3 (three) times a week. Monday,wedn,and friday 09/20/22   [provider]  baclofen (LIORESAL) 10 MG tablet Take 1 tablet (10 mg total) by mouth 3 (three) times daily as needed for muscle spasms. 12/16/19   Hilts, Casimiro Needle, MD  cephALEXin (KEFLEX) 500 MG capsule Take 1 capsule (500 mg total) by mouth 2 (two) times daily. 04/11/23   Particia Nearing, PA-C  Cholecalciferol (VITAMIN D3 PO) Take by mouth daily.    [provider]  cyclobenzaprine (  FLEXERIL) 10 MG tablet Take 1 tablet (10 mg total) by mouth 2 (two) times daily as needed for muscle spasms. 01/06/22   Juanda Chance, NP  dicyclomine (BENTYL) 20 MG tablet Take 1 tablet (20 mg total) by mouth 2 (two) times daily. 03/23/23   Glyn Ade, MD  furosemide (LASIX) 20 MG tablet Take 20 mg by mouth daily as needed. 06/26/22   [provider]  hydrochlorothiazide (HYDRODIURIL) 25 MG tablet Take 25 mg by mouth daily. 05/11/14   [provider]  Ketotifen Fumarate (ALAWAY OP)  Place 5 drops into both eyes daily as needed (for allergies).    [provider]  levothyroxine (SYNTHROID) 50 MCG tablet Take 50 mcg by mouth daily before breakfast.  02/13/19   [provider]  montelukast (SINGULAIR) 10 MG tablet Take 1 tablet by mouth daily. 02/03/19   [provider]  polyethylene glycol (MIRALAX) 17 g packet Take 17 g by mouth daily. 03/23/23   Glyn Ade, MD  potassium chloride SA (KLOR-CON M) 20 MEQ tablet Take 1 tablet (20 mEq total) by mouth daily. Take along with hydrochlorothiazide 10/29/22   Meredeth Ide, MD  traMADol (ULTRAM) 50 MG tablet Take 1 tablet (50 mg total) by mouth every 12 (twelve) hours as needed. 06/19/21   Magnant, Charles L, PA-C  triamcinolone cream (KENALOG) 0.1 % Apply 1 Application topically 2 (two) times daily. 04/11/23   Particia Nearing, PA-C  zolpidem (AMBIEN) 10 MG tablet Take 10 mg by mouth at bedtime as needed for sleep.  03/22/19   [provider]    Family History Family History  Problem Relation Age of Onset   Heart attack Mother    Hypertension Mother    Cancer Father    Diabetes Father    Hypertension Father    Breast cancer Paternal Grandfather     Social History Social History   Tobacco Use   Smoking status: Never   Smokeless tobacco: Never  Vaping Use   Vaping Use: Never used  Substance Use Topics   Alcohol use: No   Drug use: No     Allergies   Flagyl [metronidazole], Codeine, Penicillins, Erythromycin, Lisinopril, Tramadol, and Sulfa antibiotics   Review of Systems Review of Systems PER HPI  Physical Exam Triage Vital Signs ED Triage Vitals  Enc Vitals Group     BP 04/11/23 1302 121/79     Pulse Rate 04/11/23 1302 66     Resp 04/11/23 1302 20     Temp 04/11/23 1302 98.2 F (36.8 C)     Temp Source 04/11/23 1302 Oral     SpO2 04/11/23 1302 93 %     Weight --      Height --      Head Circumference --      Peak Flow --      Pain Score 04/11/23 1258 0      Pain Loc --      Pain Edu? --      Excl. in GC? --    No data found.  Updated Vital Signs BP 121/79 (BP Location: Right Arm)   Pulse 66   Temp 98.2 F (36.8 C) (Oral)   Resp 20   SpO2 93%   Visual Acuity Right Eye Distance:   Left Eye Distance:   Bilateral Distance:    Right Eye Near:   Left Eye Near:    Bilateral Near:     Physical Exam Vitals and nursing note reviewed.  Constitutional:      Appearance: Normal appearance. She is not ill-appearing.  HENT:     Head: Atraumatic.  Eyes:     Extraocular Movements: Extraocular movements intact.     Conjunctiva/sclera: Conjunctivae normal.  Cardiovascular:     Rate and Rhythm: Normal rate and regular rhythm.     Heart sounds: Normal heart sounds.  Pulmonary:     Effort: Pulmonary effort is normal.     Breath sounds: Normal breath sounds.  Musculoskeletal:        General: Normal range of motion.     Cervical back: Normal range of motion and neck supple.  Skin:    General: Skin is warm and dry.     Findings: Erythema present.     Comments: Large area of erythema and edema extending from elbow almost to wrist on the right forearm with central bite mark from insect.  Warm to the touch  Neurological:     Mental Status: She is alert and oriented to person, place, and time.     Motor: No weakness.     Gait: Gait normal.     Comments: Right upper extremity neurovascularly intact  Psychiatric:        Mood and Affect: Mood normal.        Thought Content: Thought content normal.        Judgment: Judgment normal.      UC Treatments / Results  Labs (all labs ordered are listed, but only abnormal results are displayed) Labs Reviewed - No data to display  EKG   Radiology No results found.  Procedures Procedures (including critical care time)  Medications Ordered in UC Medications - No data to display  Initial Impression / Assessment and Plan / UC Course  I have reviewed the triage vital signs and the nursing  notes.  Pertinent labs & imaging results that were available during my care of the patient were reviewed by me and considered in my medical decision making (see chart for details).     Suspect more allergic, continue antihistamines, ice, elevation and triamcinolone cream.  Declines prednisone today.  Will also send Keflex to cover for possible cellulitis from insect bite given rapid slightly delayed spread  Final Clinical Impressions(s) / UC Diagnoses   Final diagnoses:  Allergic reaction, initial encounter  Insect bite of right forearm, initial encounter     Discharge Instructions      Take an antihistamine daily to help treat the allergic response from the insect bite.  Ice and elevate the area, apply the steroid cream twice daily and I have also sent an antibiotic in case the area becomes infected.  I believe it is more allergic at this point, however want to protect against infection given the rapid spread of redness and swelling and unknown source of bite/sting.  Follow-up if worsening at any time    ED Prescriptions     Medication Sig Dispense Auth. Provider   cephALEXin (KEFLEX) 500 MG capsule  (Status: Discontinued) Take 1 capsule (500 mg total) by mouth 2 (two) times daily. 14 capsule Particia Nearing, [place]   triamcinolone cream (KENALOG) 0.1 %  (Status: Discontinued) Apply 1 Application topically 2 (two) times daily. 80 g Particia Nearing, [place]   fluconazole (DIFLUCAN) 150 MG tablet Take 1 tablet (150 mg total) by mouth every other day. 3 tablet Particia Nearing, [place]   triamcinolone cream (KENALOG) 0.1 % Apply 1 Application topically 2 (two) times daily. 80 g Roosvelt Maser  [place], PA-C   cephALEXin (KEFLEX) 500 MG capsule Take 1 capsule (500 mg total) by mouth 2 (two) times daily. 14 capsule Particia Nearing, [place]      PDMP not reviewed this encounter.   Particia Nearing, [place] 04/11/23 1333

## 2023-04-11 NOTE — Discharge Instructions (Signed)
Take an antihistamine daily to help treat the allergic response from the insect bite.  Ice and elevate the area, apply the steroid cream twice daily and I have also sent an antibiotic in case the area becomes infected.  I believe it is more allergic at this point, however want to protect against infection given the rapid spread of redness and swelling and unknown source of bite/sting.  Follow-up if worsening at any time

## 2023-04-26 ENCOUNTER — Encounter (HOSPITAL_COMMUNITY): Payer: Self-pay

## 2023-04-26 ENCOUNTER — Emergency Department (HOSPITAL_COMMUNITY): Payer: PPO

## 2023-04-26 ENCOUNTER — Emergency Department (HOSPITAL_COMMUNITY)
Admission: EM | Admit: 2023-04-26 | Discharge: 2023-04-26 | Disposition: A | Discharge status: Home / Self Care | Payer: PPO | Attending: Emergency Medicine | Admitting: Emergency Medicine

## 2023-04-26 ENCOUNTER — Other Ambulatory Visit: Payer: Self-pay

## 2023-04-26 DIAGNOSIS — Y92009 Unspecified place in unspecified non-institutional (private) residence as the place of occurrence of the external cause: Secondary | ICD-10-CM | POA: Diagnosis not present

## 2023-04-26 DIAGNOSIS — M47812 Spondylosis without myelopathy or radiculopathy, cervical region: Secondary | ICD-10-CM | POA: Diagnosis not present

## 2023-04-26 DIAGNOSIS — M47816 Spondylosis without myelopathy or radiculopathy, lumbar region: Secondary | ICD-10-CM | POA: Diagnosis not present

## 2023-04-26 DIAGNOSIS — R531 Weakness: Secondary | ICD-10-CM | POA: Diagnosis not present

## 2023-04-26 DIAGNOSIS — Z79899 Other long term (current) drug therapy: Secondary | ICD-10-CM | POA: Diagnosis not present

## 2023-04-26 DIAGNOSIS — M545 Low back pain, unspecified: Secondary | ICD-10-CM | POA: Diagnosis not present

## 2023-04-26 DIAGNOSIS — S06320A Contusion and laceration of left cerebrum without loss of consciousness, initial encounter: Secondary | ICD-10-CM | POA: Diagnosis not present

## 2023-04-26 DIAGNOSIS — S199XXA Unspecified injury of neck, initial encounter: Secondary | ICD-10-CM | POA: Diagnosis not present

## 2023-04-26 DIAGNOSIS — W0110XA Fall on same level from slipping, tripping and stumbling with subsequent striking against unspecified object, initial encounter: Secondary | ICD-10-CM | POA: Insufficient documentation

## 2023-04-26 DIAGNOSIS — W19XXXA Unspecified fall, initial encounter: Secondary | ICD-10-CM | POA: Diagnosis not present

## 2023-04-26 DIAGNOSIS — I10 Essential (primary) hypertension: Secondary | ICD-10-CM | POA: Diagnosis not present

## 2023-04-26 DIAGNOSIS — M79631 Pain in right forearm: Secondary | ICD-10-CM | POA: Insufficient documentation

## 2023-04-26 DIAGNOSIS — R519 Headache, unspecified: Secondary | ICD-10-CM | POA: Diagnosis not present

## 2023-04-26 DIAGNOSIS — Z23 Encounter for immunization: Secondary | ICD-10-CM | POA: Insufficient documentation

## 2023-04-26 DIAGNOSIS — G4489 Other headache syndrome: Secondary | ICD-10-CM | POA: Diagnosis not present

## 2023-04-26 DIAGNOSIS — M25552 Pain in left hip: Secondary | ICD-10-CM | POA: Diagnosis not present

## 2023-04-26 DIAGNOSIS — S0101XA Laceration without foreign body of scalp, initial encounter: Secondary | ICD-10-CM | POA: Insufficient documentation

## 2023-04-26 DIAGNOSIS — S0990XA Unspecified injury of head, initial encounter: Secondary | ICD-10-CM | POA: Diagnosis not present

## 2023-04-26 DIAGNOSIS — Z043 Encounter for examination and observation following other accident: Secondary | ICD-10-CM | POA: Diagnosis not present

## 2023-04-26 DIAGNOSIS — I700 Atherosclerosis of aorta: Secondary | ICD-10-CM | POA: Diagnosis not present

## 2023-04-26 MED ORDER — LIDOCAINE-EPINEPHRINE (PF) 2 %-1:200000 IJ SOLN
10.0000 mL | Freq: Once | INTRAMUSCULAR | Status: DC
  Filled 2023-04-26: qty 20

## 2023-04-26 MED ORDER — ACETAMINOPHEN 325 MG PO TABS
650.0000 mg | ORAL_TABLET | Freq: Once | ORAL | Status: AC
  Administered 2023-04-26: 650 mg via ORAL
  Filled 2023-04-26: qty 2

## 2023-04-26 MED ORDER — TETANUS-DIPHTH-ACELL PERTUSSIS 5-2.5-18.5 LF-MCG/0.5 IM SUSY
0.5000 mL | PREFILLED_SYRINGE | Freq: Once | INTRAMUSCULAR | Status: AC
  Administered 2023-04-26: 0.5 mL via INTRAMUSCULAR
  Filled 2023-04-26: qty 0.5

## 2023-04-26 MED ORDER — FENTANYL CITRATE PF 50 MCG/ML IJ SOSY
25.0000 ug | PREFILLED_SYRINGE | Freq: Once | INTRAMUSCULAR | Status: AC
  Administered 2023-04-26: 25 ug via INTRAVENOUS
  Filled 2023-04-26: qty 1

## 2023-04-26 MED ORDER — ONDANSETRON HCL 4 MG/2ML IJ SOLN
4.0000 mg | Freq: Once | INTRAMUSCULAR | Status: AC
  Administered 2023-04-26: 4 mg via INTRAVENOUS
  Filled 2023-04-26: qty 2

## 2023-04-26 NOTE — ED Notes (Signed)
Pt placed on 2L [place] due to oxygen dropping to 88% following pain medication.

## 2023-04-26 NOTE — ED Triage Notes (Signed)
[facility] reports coming from home for a fall. Pt was in the garage and bent over and fell backwards hitting her head. Hematoma to back of head with laceration, no LOC no blood thinners. Pt also c/o lower back pain.

## 2023-04-26 NOTE — Discharge Instructions (Signed)
Your staples need to be removed in about 7 days by a doctor.  You should follow-up with your doctor.  If you develop severe pain or any other new symptoms you should return to the ED.

## 2023-04-26 NOTE — ED Provider Notes (Signed)
[place] EMERGENCY DEPARTMENT AT [facility] Provider Note   CSN: [id number] Arrival date & time: 04/26/23  1850     History  Chief Complaint  Patient presents with   [name]    [name] is a 77 y.o. female.   Fall  77 year old female history of hypertension, depression presenting for fall.  Was at home when she bent over to take off her shoes and she fell backwards and hit her head.  No presyncope or syncope.  She did not lose consciousness.  She has an injury to the back of her head.  She reports pain to her head, lower back, left buttocks.  Unsure of last tetanus.  No chest pain or difficulty breathing.  No abdominal pain.  She is otherwise been at her baseline health.  Family is at bedside.     Home Medications Prior to Admission medications   Medication Sig Start Date End Date Taking? Authorizing Provider  albuterol (PROVENTIL) (2.5 MG/3ML) 0.083% nebulizer solution Take 3 mLs (2.5 mg total) by nebulization every 6 (six) hours as needed for wheezing or shortness of breath. 08/24/22  Yes Leath-Warren, Sadie Haber, NP  ALPRAZolam Prudy Feeler) 1 MG tablet Take 1 mg by mouth in the morning and at bedtime. 05/16/14  Yes [provider]  amLODipine (NORVASC) 5 MG tablet Take 5 mg by mouth in the morning. 09/01/22  Yes [provider]  atorvastatin (LIPITOR) 40 MG tablet Take 40 mg by mouth every Monday, Wednesday, and Friday. 09/20/22  Yes [provider]  Cholecalciferol (VITAMIN D3 PO) Take 1 capsule by mouth daily.   Yes [provider]  cyclobenzaprine (FLEXERIL) 10 MG tablet Take 1 tablet (10 mg total) by mouth 2 (two) times daily as needed for muscle spasms. 01/06/22  Yes Ellin Goodie E, NP  furosemide (LASIX) 20 MG tablet Take 20 mg by mouth daily as needed for fluid or edema. 06/26/22  Yes [provider]  hydrochlorothiazide (HYDRODIURIL) 25 MG tablet Take 25 mg by mouth daily. 05/11/14  Yes [provider]   Ketotifen Fumarate (ALAWAY OP) Place 1 drop into both eyes 4 (four) times daily as needed (for allergies or irritation).   Yes [provider]  levothyroxine (SYNTHROID) 50 MCG tablet Take 50 mcg by mouth daily before breakfast.  02/13/19  Yes [provider]  polyethylene glycol (MIRALAX) 17 g packet Take 17 g by mouth daily. 03/23/23  Yes Countryman, Almeta Monas, MD  potassium chloride SA (KLOR-CON M) 20 MEQ tablet Take 1 tablet (20 mEq total) by mouth daily. Take along with hydrochlorothiazide Patient taking differently: Take 20 mEq by mouth See admin instructions. Take 20 mEq by mouth once a day only when taking Furosemide 10/29/22  Yes Lama, Sarina Ill, MD  promethazine (PHENERGAN) 25 MG tablet Take 25 mg by mouth 2 (two) times daily as needed for nausea or vomiting.   Yes [provider]  triamcinolone cream (KENALOG) 0.1 % Apply 1 Application topically 2 (two) times daily. Patient taking differently: Apply 1 Application topically 2 (two) times daily as needed (for itching). 04/11/23  Yes Particia Nearing, PA-C  TYLENOL 500 MG tablet Take 500-1,000 mg by mouth every 6 (six) hours as needed for mild pain or headache.   Yes [provider]  zolpidem (AMBIEN) 10 MG tablet Take 10 mg by mouth at bedtime as needed for sleep.  03/22/19  Yes [provider]  amoxicillin-clavulanate (AUGMENTIN) 875-125 MG tablet Take 1 tablet by mouth every  12 (twelve) hours. Patient not taking: Reported on 04/26/2023 03/23/23   Glyn Ade, MD  baclofen (LIORESAL) 10 MG tablet Take 1 tablet (10 mg total) by mouth 3 (three) times daily as needed for muscle spasms. Patient not taking: Reported on 04/26/2023 12/16/19   Hilts, Casimiro Needle, MD  cephALEXin (KEFLEX) 500 MG capsule Take 1 capsule (500 mg total) by mouth 2 (two) times daily. Patient not taking: Reported on 04/26/2023 04/11/23   Particia Nearing, PA-C  dicyclomine (BENTYL) 20 MG tablet Take 1 tablet (20 mg total) by mouth 2  (two) times daily. Patient not taking: Reported on 04/26/2023 03/23/23   Glyn Ade, MD  fluconazole (DIFLUCAN) 150 MG tablet Take 1 tablet (150 mg total) by mouth every other day. Patient not taking: Reported on 04/26/2023 04/11/23   Particia Nearing, PA-C  traMADol (ULTRAM) 50 MG tablet Take 1 tablet (50 mg total) by mouth every 12 (twelve) hours as needed. Patient not taking: Reported on 04/26/2023 06/19/21   Magnant, Joycie Peek, PA-C      Allergies    Flagyl [metronidazole], Codeine, Penicillins, Erythromycin, Lisinopril, Tramadol, and Sulfa antibiotics    Review of Systems   Review of Systems Review of systems completed and notable as per HPI.  ROS otherwise negative.   Physical Exam Updated Vital Signs BP 139/78   Pulse 70   Temp 99.2 F (37.3 C) (Oral)   Resp 16   SpO2 97%  Physical Exam Vitals and nursing note reviewed.  Constitutional:      General: She is not in acute distress.    Appearance: She is well-developed.  HENT:     Head: Normocephalic.     Comments: Hematoma and 1 cm head laceration to the left posterior scalp Eyes:     Conjunctiva/sclera: Conjunctivae normal.  Cardiovascular:     Rate and Rhythm: Normal rate and regular rhythm.     Heart sounds: No murmur heard. Pulmonary:     Effort: Pulmonary effort is normal. No respiratory distress.     Breath sounds: Normal breath sounds.  Abdominal:     Palpations: Abdomen is soft.     Tenderness: There is no abdominal tenderness.  Musculoskeletal:        General: No swelling.     Cervical back: Neck supple. No rigidity or tenderness.     Right lower leg: No edema.     Left lower leg: No edema.     Comments: Mild tenderness to right forearm and right tibia.  Distal pulses intact.  Skin:    General: Skin is warm and dry.     Capillary Refill: Capillary refill takes less than 2 seconds.  Neurological:     General: No focal deficit present.     Mental Status: She is alert and oriented to person,  place, and time. Mental status is at baseline.  Psychiatric:        Mood and Affect: Mood normal.     ED Results / Procedures / Treatments   Labs (all labs ordered are listed, but only abnormal results are displayed) Labs Reviewed - No data to display  EKG None  Radiology DG Tibia/Fibula Right  Result Date: 04/26/2023 CLINICAL DATA:  Fall EXAM: RIGHT TIBIA AND FIBULA - 2 VIEW COMPARISON:  None Available. FINDINGS: There is no evidence of fracture or other focal bone lesions. Soft tissues are unremarkable. IMPRESSION: Negative. Electronically Signed   By: Jasmine Pang M.D.   On: 04/26/2023 21:13   DG CHEST PORT 1  VIEW  Result Date: 04/26/2023 CLINICAL DATA:  Larey Seat backwards and garage hitting head.  Fall EXAM: PORTABLE CHEST 1 VIEW COMPARISON:  09/01/2022 FINDINGS: Stable cardiomediastinal silhouette. No focal consolidation, pleural effusion, or pneumothorax. No displaced rib fractures. Calcified granulomas in the mid lungs. IMPRESSION: No active disease. Electronically Signed   By: Minerva Fester M.D.   On: 04/26/2023 21:13   CT Cervical Spine Wo Contrast  Result Date: 04/26/2023 CLINICAL DATA:  Neck trauma. EXAM: CT CERVICAL SPINE WITHOUT CONTRAST TECHNIQUE: Multidetector CT imaging of the cervical spine was performed without intravenous contrast. Multiplanar CT image reconstructions were also generated. RADIATION DOSE REDUCTION: This exam was performed according to the departmental dose-optimization program which includes automated exposure control, adjustment of the mA and/or kV according to patient size and/or use of iterative reconstruction technique. COMPARISON:  None Available. FINDINGS: Alignment: Normal. Skull base and vertebrae: No acute fracture. No primary bone lesion or focal pathologic process. Soft tissues and spinal canal: No prevertebral fluid or swelling. No visible canal hematoma. Disc levels:  Multilevel osteoarthritic changes. Upper chest: Negative. Other: None.  IMPRESSION: 1. No evidence of acute fracture or subluxation of the cervical spine. 2. Multilevel osteoarthritic changes. Electronically Signed   By: Ted Mcalpine M.D.   On: 04/26/2023 20:57   CT Head Wo Contrast  Result Date: 04/26/2023 CLINICAL DATA:  Head trauma. EXAM: CT HEAD WITHOUT CONTRAST TECHNIQUE: Contiguous axial images were obtained from the base of the skull through the vertex without intravenous contrast. RADIATION DOSE REDUCTION: This exam was performed according to the departmental dose-optimization program which includes automated exposure control, adjustment of the mA and/or kV according to patient size and/or use of iterative reconstruction technique. COMPARISON:  May 28, 2014 FINDINGS: Brain: No evidence of acute infarction, hemorrhage, hydrocephalus, extra-axial collection or mass lesion/mass effect. Vascular: No hyperdense vessel or unexpected calcification. Skull: Normal. Negative for fracture or focal lesion. Sinuses/Orbits: No acute finding. Other: Large left parietal scalp hematoma. IMPRESSION: 1. No acute intracranial abnormality. 2. Large left parietal scalp hematoma. Electronically Signed   By: Ted Mcalpine M.D.   On: 04/26/2023 20:55   CT Lumbar Spine Wo Contrast  Result Date: 04/26/2023 CLINICAL DATA:  Fall, low back pain EXAM: CT LUMBAR SPINE WITHOUT CONTRAST TECHNIQUE: Multidetector CT imaging of the lumbar spine was performed without intravenous contrast administration. Multiplanar CT image reconstructions were also generated. RADIATION DOSE REDUCTION: This exam was performed according to the departmental dose-optimization program which includes automated exposure control, adjustment of the mA and/or kV according to patient size and/or use of iterative reconstruction technique. COMPARISON:  CT abdomen 03/23/2023 FINDINGS: Segmentation: The lowest lumbar type non-rib-bearing vertebra is labeled as L5. Alignment: No vertebral subluxation is observed. Vertebrae:  Multilevel Schmorl's nodes. No lumbar spine fracture or acute bony findings. Preserved intervertebral disc height. Paraspinal and other soft tissues: Atherosclerosis is present, including aortoiliac atherosclerotic disease. Disc levels: No significant findings above the L2-3 level. L2-3: No impingement.  Mild disc bulge L3-4: No impingement.  Mild disc bulge. L4-5: No impingement.  Mild left eccentric disc bulge. L5-S1: No impingement.  Mild disc bulge. IMPRESSION: 1. No lumbar spine fracture or acute bony findings. 2. Mild lumbar spondylosis. 3. Aortoiliac atherosclerosis. Aortic Atherosclerosis (ICD10-I70.0). Electronically Signed   By: Gaylyn Rong M.D.   On: 04/26/2023 20:54    Procedures .Marland KitchenLaceration Repair  Date/Time: 04/26/2023 11:51 PM  Performed by: Laurence Spates, MD Authorized by: Laurence Spates, MD   Consent:    Consent obtained:  Verbal  Consent given by:  Patient   Risks, benefits, and alternatives were discussed: yes     Risks discussed:  Infection, need for additional repair, poor cosmetic result and pain   Alternatives discussed:  No treatment Universal protocol:    Patient identity confirmed:  Verbally with patient Anesthesia:    Anesthesia method:  None Laceration details:    Location:  Scalp   Scalp location:  L parietal   Length (cm):  1 Pre-procedure details:    Preparation:  Patient was prepped and draped in usual sterile fashion Exploration:    Limited defect created (wound extended): no     Hemostasis achieved with:  Direct pressure   Imaging outcome: foreign body not noted     Wound exploration: entire depth of wound visualized   Treatment:    Area cleansed with:  Chlorhexidine   Amount of cleaning:  Standard   Irrigation solution:  Sterile saline   Irrigation method:  Syringe Skin repair:    Repair method:  Staples   Number of staples:  2 Approximation:    Approximation:  Close Repair type:    Repair type:  Simple Post-procedure  details:    Dressing:  Sterile dressing   Procedure completion:  Tolerated well, no immediate complications     Medications Ordered in ED Medications  lidocaine-EPINEPHrine (XYLOCAINE W/EPI) 2 %-1:200000 (PF) injection 10 mL (10 mLs Infiltration Not Given 04/26/23 2251)  fentaNYL (SUBLIMAZE) injection 25 mcg (25 mcg Intravenous Given 04/26/23 2105)  ondansetron (ZOFRAN) injection 4 mg (4 mg Intravenous Given 04/26/23 2105)  Tdap (BOOSTRIX) injection 0.5 mL (0.5 mLs Intramuscular Given 04/26/23 2200)  acetaminophen (TYLENOL) tablet 650 mg (650 mg Oral Given 04/26/23 2349)    ED Course/ Medical Decision Making/ A&P                             Medical Decision Making Amount and/or Complexity of Data Reviewed Radiology: ordered.  Risk OTC drugs. Prescription drug management.   Medical Decision Making:   [name] is a 77 y.o. female who presented to the ED today with mechanical fall.  Vital signs notable for hypertension.  On exam she is neurovascular intact.  She has pain to her posterior head, right forearm, right tibia, lower back and left buttocks.  Tetanus updated.  She does have a small scalp laceration.  Fall was mechanical, no signs of syncope or other acute abnormality.  No indication for lab workup at this time.   Additional history discussed with patient's family/caregivers.  Patient placed on continuous vitals and telemetry monitoring while in ED which was reviewed periodically.  Reviewed and confirmed nursing documentation for past medical history, family history, social history.  Initial Study Results:    Radiology:  All images reviewed independently.  CT head and cervical spine unremarkable.  Chest x-ray unremarkable.  No injury to the lumbar spine.  X-ray of the left hip and pelvis has no acute abnormality on my read.  Unfortunate radiologist does not be able to read this.  Agree with radiology report at this time.    Reassessment and Plan:   On reassessment  patient is awake and alert.  Head lac repaired as above.  Recommended removal by PCP in 7 days.  She was able ambulate without assistance, and family is able to help her at home.  Work appears reassuring.  Unfortunately despite calling the radiology office there still was a delay in reading the x-ray  of the left hip and pelvis.  I reviewed, I do not see any acute fracture.  Family was requesting discharge home.  I discussed with him that given she is able to ambulate without significant pain I have low concern for fracture but we could be missing a fracture.  They preferred to discharge home and have me call them if there was an abnormality on the read.  I think this is reasonable.  She was discharged home with strict return precautions.   Patient's presentation is most consistent with acute presentation with potential threat to life or bodily function.           Final Clinical Impression(s) / ED Diagnoses Final diagnoses:  Fall, initial encounter    Rx / DC Orders ED Discharge Orders     None         Laurence Spates, MD 04/26/23 [phone]

## 2023-04-26 NOTE — ED Notes (Signed)
Pt to imaging

## 2023-05-07 DIAGNOSIS — F411 Generalized anxiety disorder: Secondary | ICD-10-CM | POA: Diagnosis not present

## 2023-05-07 DIAGNOSIS — R2689 Other abnormalities of gait and mobility: Secondary | ICD-10-CM | POA: Diagnosis not present

## 2023-05-07 DIAGNOSIS — Z4802 Encounter for removal of sutures: Secondary | ICD-10-CM | POA: Diagnosis not present

## 2023-05-07 DIAGNOSIS — R296 Repeated falls: Secondary | ICD-10-CM | POA: Diagnosis not present

## 2023-05-07 DIAGNOSIS — Z9181 History of falling: Secondary | ICD-10-CM | POA: Diagnosis not present

## 2023-05-17 DIAGNOSIS — K219 Gastro-esophageal reflux disease without esophagitis: Secondary | ICD-10-CM | POA: Diagnosis not present

## 2023-05-17 DIAGNOSIS — F411 Generalized anxiety disorder: Secondary | ICD-10-CM | POA: Diagnosis not present

## 2023-05-17 DIAGNOSIS — E7800 Pure hypercholesterolemia, unspecified: Secondary | ICD-10-CM | POA: Diagnosis not present

## 2023-05-17 DIAGNOSIS — F32A Depression, unspecified: Secondary | ICD-10-CM | POA: Diagnosis not present

## 2023-05-17 DIAGNOSIS — G4730 Sleep apnea, unspecified: Secondary | ICD-10-CM | POA: Diagnosis not present

## 2023-05-17 DIAGNOSIS — G4700 Insomnia, unspecified: Secondary | ICD-10-CM | POA: Diagnosis not present

## 2023-05-17 DIAGNOSIS — Z9181 History of falling: Secondary | ICD-10-CM | POA: Diagnosis not present

## 2023-05-17 DIAGNOSIS — I10 Essential (primary) hypertension: Secondary | ICD-10-CM | POA: Diagnosis not present

## 2023-05-17 DIAGNOSIS — Z85828 Personal history of other malignant neoplasm of skin: Secondary | ICD-10-CM | POA: Diagnosis not present

## 2023-05-17 DIAGNOSIS — M797 Fibromyalgia: Secondary | ICD-10-CM | POA: Diagnosis not present

## 2023-05-17 DIAGNOSIS — R296 Repeated falls: Secondary | ICD-10-CM | POA: Diagnosis not present

## 2023-05-21 DIAGNOSIS — L448 Other specified papulosquamous disorders: Secondary | ICD-10-CM | POA: Diagnosis not present

## 2023-05-21 DIAGNOSIS — L905 Scar conditions and fibrosis of skin: Secondary | ICD-10-CM | POA: Diagnosis not present

## 2023-05-21 DIAGNOSIS — L538 Other specified erythematous conditions: Secondary | ICD-10-CM | POA: Diagnosis not present

## 2023-05-21 DIAGNOSIS — C44729 Squamous cell carcinoma of skin of left lower limb, including hip: Secondary | ICD-10-CM | POA: Diagnosis not present

## 2023-07-22 ENCOUNTER — Ambulatory Visit: Payer: PPO | Admitting: Gastroenterology

## 2023-07-29 ENCOUNTER — Other Ambulatory Visit: Payer: Self-pay | Admitting: Internal Medicine

## 2023-07-29 ENCOUNTER — Encounter: Payer: Self-pay | Admitting: Internal Medicine

## 2023-07-29 DIAGNOSIS — Z1231 Encounter for screening mammogram for malignant neoplasm of breast: Secondary | ICD-10-CM

## 2023-08-21 DIAGNOSIS — L728 Other follicular cysts of the skin and subcutaneous tissue: Secondary | ICD-10-CM | POA: Diagnosis not present

## 2023-08-21 DIAGNOSIS — L821 Other seborrheic keratosis: Secondary | ICD-10-CM | POA: Diagnosis not present

## 2023-08-21 DIAGNOSIS — Z85820 Personal history of malignant melanoma of skin: Secondary | ICD-10-CM | POA: Diagnosis not present

## 2023-08-21 DIAGNOSIS — Z08 Encounter for follow-up examination after completed treatment for malignant neoplasm: Secondary | ICD-10-CM | POA: Diagnosis not present

## 2023-08-21 DIAGNOSIS — L814 Other melanin hyperpigmentation: Secondary | ICD-10-CM | POA: Diagnosis not present

## 2023-08-21 DIAGNOSIS — Z85828 Personal history of other malignant neoplasm of skin: Secondary | ICD-10-CM | POA: Diagnosis not present

## 2023-08-25 ENCOUNTER — Ambulatory Visit
Admission: RE | Admit: 2023-08-25 | Discharge: 2023-08-25 | Disposition: A | Discharge status: Home / Self Care | Payer: PPO | Source: Ambulatory Visit | Attending: Internal Medicine

## 2023-08-25 DIAGNOSIS — Z1231 Encounter for screening mammogram for malignant neoplasm of breast: Secondary | ICD-10-CM

## 2023-09-16 DIAGNOSIS — I10 Essential (primary) hypertension: Secondary | ICD-10-CM | POA: Diagnosis not present

## 2023-09-16 DIAGNOSIS — Z136 Encounter for screening for cardiovascular disorders: Secondary | ICD-10-CM | POA: Diagnosis not present

## 2023-09-16 DIAGNOSIS — F411 Generalized anxiety disorder: Secondary | ICD-10-CM | POA: Diagnosis not present

## 2023-09-16 DIAGNOSIS — E039 Hypothyroidism, unspecified: Secondary | ICD-10-CM | POA: Diagnosis not present

## 2023-09-16 DIAGNOSIS — R0789 Other chest pain: Secondary | ICD-10-CM | POA: Diagnosis not present

## 2023-09-16 DIAGNOSIS — J4520 Mild intermittent asthma, uncomplicated: Secondary | ICD-10-CM | POA: Diagnosis not present

## 2023-09-16 DIAGNOSIS — R42 Dizziness and giddiness: Secondary | ICD-10-CM | POA: Diagnosis not present

## 2023-09-16 DIAGNOSIS — E7800 Pure hypercholesterolemia, unspecified: Secondary | ICD-10-CM | POA: Diagnosis not present

## 2023-09-16 DIAGNOSIS — I700 Atherosclerosis of aorta: Secondary | ICD-10-CM | POA: Diagnosis not present

## 2023-09-16 DIAGNOSIS — G4700 Insomnia, unspecified: Secondary | ICD-10-CM | POA: Diagnosis not present

## 2023-09-16 DIAGNOSIS — R600 Localized edema: Secondary | ICD-10-CM | POA: Diagnosis not present

## 2023-09-21 DIAGNOSIS — H2589 Other age-related cataract: Secondary | ICD-10-CM | POA: Diagnosis not present

## 2023-09-21 DIAGNOSIS — H04123 Dry eye syndrome of bilateral lacrimal glands: Secondary | ICD-10-CM | POA: Diagnosis not present

## 2023-09-21 DIAGNOSIS — H26493 Other secondary cataract, bilateral: Secondary | ICD-10-CM | POA: Diagnosis not present

## 2023-09-21 DIAGNOSIS — H40013 Open angle with borderline findings, low risk, bilateral: Secondary | ICD-10-CM | POA: Diagnosis not present

## 2023-09-21 DIAGNOSIS — Z961 Presence of intraocular lens: Secondary | ICD-10-CM | POA: Diagnosis not present

## 2023-09-21 DIAGNOSIS — H5703 Miosis: Secondary | ICD-10-CM | POA: Diagnosis not present

## 2023-09-21 DIAGNOSIS — H31092 Other chorioretinal scars, left eye: Secondary | ICD-10-CM | POA: Diagnosis not present

## 2023-09-21 DIAGNOSIS — H33302 Unspecified retinal break, left eye: Secondary | ICD-10-CM | POA: Diagnosis not present

## 2023-09-21 DIAGNOSIS — H43812 Vitreous degeneration, left eye: Secondary | ICD-10-CM | POA: Diagnosis not present

## 2023-09-21 DIAGNOSIS — H43811 Vitreous degeneration, right eye: Secondary | ICD-10-CM | POA: Diagnosis not present

## 2023-10-15 NOTE — Progress Notes (Signed)
 Cardiology Office Note:  .   Date:  10/21/2023 ID:  [name], DOB 09/20/1946, MRN [id number] PCP: Vernon Velna SAUNDERS, MD [facility] Providers Cardiologist:  None   Patient Profile: .      PMH Hypertension Aortic atherosclerosis noted on CT 07/2019 Dyslipidemia Generalized anxiety OSA Hypothyroidism Obesity Coronary artery disease CT Calcium  score 10/05/23 CAC score 484 ( LM 89 LAD 257 Circumflex 81 RCA 57 PDA 0 PVCs       History of Present Illness: .   [name] is a very pleasant 78 y.o. female  who is here today for new patient consult for chest pain and family history of CAD. CT calcium  score completed 10/05/2023 revealed CAC score of 484.  She reports an approximate 52-month history of increased fatigue, decreased exercise tolerance and chest discomfort relieved with rest.  Symptoms exacerbated by exertion, such as housework.  She was previously walking up to 5 miles most days but has not been able to do that since October due to increased symptoms. One episode occurred a few weeks ago that she describes as a tightness, as if something was just squeezing the life out of me.  Pain radiated from her chest to her jaw and her left arm ate as well.  Her husband gave her aspirin  and the pain gradually went away.  Her PCP a few days later and EKG was unremarkable.  She was subsequently referred to cardiology.  She reports an almost constant twinge in her chest and at times feels her heart pounding when she is laying down with a subsequent swishing in her ears.  She has significant family history of early CAD in both her parents. Has been on therapy for hypertension and hyperlipidemia for several years. Started to notice leg swelling this summer and was given Lasix and potassium to take as needed.  She denies shortness of breath, diaphoresis, n/v associated with chest discomfort.  She also reports occasional dizziness, particularly with getting up from sitting.  Reports  BP has been well-controlled.  She generally eats a healthy diet of mostly fish and chicken as well as lots of vegetables which she grows herself.   Pillars of CV Risk:  Hypertension Family Hx of Premature CAD  (1st degree relative M < 55, F < 65)   Family history: Her family history includes Breast cancer in her paternal grandfather; CAD in her father; Cancer in her father; Coronary artery disease in her mother; Diabetes in her father; Heart attack in her father and mother; Hypertension in her brother, father, mother, and sister; Lung cancer in her mother; Throat cancer in her mother.  Mother age 98 first heart attack Father had 7 heart attacks, died at age 40  Diet: Cooks frequently Grows own vegetables and eats them frequently Salmon, grilled and baked chicken mostly Baked sweet potatoes, beets Infrequent red meat  Activity: Previously walked 5 miles almost daily but has been unable to tolerate since October 2024   ROS: See HPI       Studies Reviewed: .         CT Calcium  Score at [facility] 10/05/23 Total calcium  score 484 (75th to 90th percentile per Hoff data base) LM 89 LAD 257 Cx 81 RCA 57 PDA 0  Risk Assessment/Calculations:             Physical Exam:   VS: BP 126/60   Pulse (!) 55   Ht 5' 4 (1.626 m)   Wt 182 lb 11.2 oz (  82.9 kg)   SpO2 94%   BMI 31.36 kg/m   Wt Readings from Last 3 Encounters:  10/21/23 182 lb 11.2 oz (82.9 kg)  10/20/23 180 lb (81.6 kg)  03/23/23 184 lb (83.5 kg)     GEN: Well nourished, well developed in no acute distress NECK: No JVD; No carotid bruits CARDIAC: Irregular RR, no murmurs, rubs, gallops RESPIRATORY:  Clear to auscultation without rales, wheezing or rhonchi  ABDOMEN: Soft, non-tender, non-distended EXTREMITIES:  No edema; No deformity     ASSESSMENT AND PLAN: .    CAD with angina: She has symptoms concerning for angina with exertional chest pain, decreased activity tolerance, generalized fatigue, and occasional  dizziness.  Recent CT calcium  score of 485 with majority of calcium  distributed in LAD.  Additional risk factors of hyperlipidemia, hypertension, and strong family history of early CAD.  Case discussed with DOD, Dr. Mona, who agrees that we proceed with cardiac catheterization.  Risks of cardiac catheterization discussed with patient and she is willing to proceed.  We advised her to start aspirin  81 mg daily and have given her sublingual nitroglycerin  and instructed her on how to use.  We will get precath CBC and be met as well as liver and lipid panel today.  Dyslipidemia: Lipid panel completed 03/13/2023 with total cholesterol 137, triglycerides 135, HDL 49, LDL-C 65. She is on atorvastatin  3 days per week. She denies intolerance of higher dose. We will repeat lipid panel today and advise further recommendations based on results.  Continue atorvastatin  along with heart healthy diet avoiding saturated fat, processed foods, sugar, and simple carbohydrates.  Hypertension: BP is well-controlled.  We will ensure stable renal function on labs today.  Carotid artery disease: She reports occasional dizziness. Carotid ultrasound 2015 with bilateral 1-39% stenosis. We will get carotid duplex in the next few weeks for surveillance.  PVCs: Irregular heart rate noted on exam thought clinically to be PVCs.  History of Holter monitor which revealed atrial tachycardia, PACs and PVCs. Consider addition of low dose beta blocker at next appointment.    Plan/Goals:  Plan/Goals: 1: Recommend limiting activity until evaluation of symptoms concerning for angina is complete       Informed Consent   Shared Decision Making/Informed Consent The risks [stroke (1 in 1000), death (1 in 1000), kidney failure [usually temporary] (1 in 500), bleeding (1 in 200), allergic reaction [possibly serious] (1 in 200)], benefits (diagnostic support and management of coronary artery disease) and alternatives of a cardiac catheterization  were discussed in detail with [name] and she is willing to proceed.     Dispo: 3-4 weeks post cath Dr. Jordan or APP  Signed, Rosaline Bane, NP-C

## 2023-10-15 NOTE — H&P (View-Only) (Signed)
 Cardiology Office Note:  .   Date:  10/21/2023 ID:  [name], DOB 09/20/1946, MRN [id number] PCP: Vernon Velna SAUNDERS, MD [facility] Providers Cardiologist:  None   Patient Profile: .      PMH Hypertension Aortic atherosclerosis noted on CT 07/2019 Dyslipidemia Generalized anxiety OSA Hypothyroidism Obesity Coronary artery disease CT Calcium  score 10/05/23 CAC score 484 ( LM 89 LAD 257 Circumflex 81 RCA 57 PDA 0 PVCs       History of Present Illness: .   [name] is a very pleasant 78 y.o. female  who is here today for new patient consult for chest pain and family history of CAD. CT calcium  score completed 10/05/2023 revealed CAC score of 484.  She reports an approximate 52-month history of increased fatigue, decreased exercise tolerance and chest discomfort relieved with rest.  Symptoms exacerbated by exertion, such as housework.  She was previously walking up to 5 miles most days but has not been able to do that since October due to increased symptoms. One episode occurred a few weeks ago that she describes as a tightness, as if something was just squeezing the life out of me.  Pain radiated from her chest to her jaw and her left arm ate as well.  Her husband gave her aspirin  and the pain gradually went away.  Her PCP a few days later and EKG was unremarkable.  She was subsequently referred to cardiology.  She reports an almost constant twinge in her chest and at times feels her heart pounding when she is laying down with a subsequent swishing in her ears.  She has significant family history of early CAD in both her parents. Has been on therapy for hypertension and hyperlipidemia for several years. Started to notice leg swelling this summer and was given Lasix and potassium to take as needed.  She denies shortness of breath, diaphoresis, n/v associated with chest discomfort.  She also reports occasional dizziness, particularly with getting up from sitting.  Reports  BP has been well-controlled.  She generally eats a healthy diet of mostly fish and chicken as well as lots of vegetables which she grows herself.   Pillars of CV Risk:  Hypertension Family Hx of Premature CAD  (1st degree relative M < 55, F < 65)   Family history: Her family history includes Breast cancer in her paternal grandfather; CAD in her father; Cancer in her father; Coronary artery disease in her mother; Diabetes in her father; Heart attack in her father and mother; Hypertension in her brother, father, mother, and sister; Lung cancer in her mother; Throat cancer in her mother.  Mother age 98 first heart attack Father had 7 heart attacks, died at age 40  Diet: Cooks frequently Grows own vegetables and eats them frequently Salmon, grilled and baked chicken mostly Baked sweet potatoes, beets Infrequent red meat  Activity: Previously walked 5 miles almost daily but has been unable to tolerate since October 2024   ROS: See HPI       Studies Reviewed: .         CT Calcium  Score at [facility] 10/05/23 Total calcium  score 484 (75th to 90th percentile per Hoff data base) LM 89 LAD 257 Cx 81 RCA 57 PDA 0  Risk Assessment/Calculations:             Physical Exam:   VS: BP 126/60   Pulse (!) 55   Ht 5' 4 (1.626 m)   Wt 182 lb 11.2 oz (  82.9 kg)   SpO2 94%   BMI 31.36 kg/m   Wt Readings from Last 3 Encounters:  10/21/23 182 lb 11.2 oz (82.9 kg)  10/20/23 180 lb (81.6 kg)  03/23/23 184 lb (83.5 kg)     GEN: Well nourished, well developed in no acute distress NECK: No JVD; No carotid bruits CARDIAC: Irregular RR, no murmurs, rubs, gallops RESPIRATORY:  Clear to auscultation without rales, wheezing or rhonchi  ABDOMEN: Soft, non-tender, non-distended EXTREMITIES:  No edema; No deformity     ASSESSMENT AND PLAN: .    CAD with angina: She has symptoms concerning for angina with exertional chest pain, decreased activity tolerance, generalized fatigue, and occasional  dizziness.  Recent CT calcium  score of 485 with majority of calcium  distributed in LAD.  Additional risk factors of hyperlipidemia, hypertension, and strong family history of early CAD.  Case discussed with DOD, Dr. Mona, who agrees that we proceed with cardiac catheterization.  Risks of cardiac catheterization discussed with patient and she is willing to proceed.  We advised her to start aspirin  81 mg daily and have given her sublingual nitroglycerin  and instructed her on how to use.  We will get precath CBC and be met as well as liver and lipid panel today.  Dyslipidemia: Lipid panel completed 03/13/2023 with total cholesterol 137, triglycerides 135, HDL 49, LDL-C 65. She is on atorvastatin  3 days per week. She denies intolerance of higher dose. We will repeat lipid panel today and advise further recommendations based on results.  Continue atorvastatin  along with heart healthy diet avoiding saturated fat, processed foods, sugar, and simple carbohydrates.  Hypertension: BP is well-controlled.  We will ensure stable renal function on labs today.  Carotid artery disease: She reports occasional dizziness. Carotid ultrasound 2015 with bilateral 1-39% stenosis. We will get carotid duplex in the next few weeks for surveillance.  PVCs: Irregular heart rate noted on exam thought clinically to be PVCs.  History of Holter monitor which revealed atrial tachycardia, PACs and PVCs. Consider addition of low dose beta blocker at next appointment.    Plan/Goals:  Plan/Goals: 1: Recommend limiting activity until evaluation of symptoms concerning for angina is complete       Informed Consent   Shared Decision Making/Informed Consent The risks [stroke (1 in 1000), death (1 in 1000), kidney failure [usually temporary] (1 in 500), bleeding (1 in 200), allergic reaction [possibly serious] (1 in 200)], benefits (diagnostic support and management of coronary artery disease) and alternatives of a cardiac catheterization  were discussed in detail with [name] and she is willing to proceed.     Dispo: 3-4 weeks post cath Dr. Jordan or APP  Signed, Rosaline Bane, NP-C

## 2023-10-19 ENCOUNTER — Encounter (HOSPITAL_BASED_OUTPATIENT_CLINIC_OR_DEPARTMENT_OTHER): Payer: Self-pay | Admitting: Nurse Practitioner

## 2023-10-20 ENCOUNTER — Ambulatory Visit: Payer: PPO | Admitting: Gastroenterology

## 2023-10-20 ENCOUNTER — Encounter: Payer: Self-pay | Admitting: Gastroenterology

## 2023-10-20 VITALS — BP 108/64 | HR 64 | Ht 64.0 in | Wt 180.0 lb

## 2023-10-20 DIAGNOSIS — K5909 Other constipation: Secondary | ICD-10-CM | POA: Diagnosis not present

## 2023-10-20 DIAGNOSIS — Z8719 Personal history of other diseases of the digestive system: Secondary | ICD-10-CM

## 2023-10-20 DIAGNOSIS — K641 Second degree hemorrhoids: Secondary | ICD-10-CM

## 2023-10-20 DIAGNOSIS — Z860100 Personal history of colon polyps, unspecified: Secondary | ICD-10-CM

## 2023-10-20 DIAGNOSIS — R0600 Dyspnea, unspecified: Secondary | ICD-10-CM

## 2023-10-20 NOTE — Progress Notes (Signed)
 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  She was previously followed by [facility].  She want to transition her care to our practice for another opinion.  Looking through her file I can see that she has had issues with diverticulitis in the past involving not only her colon but as well as her terminal ileum.  These have led to hospitalization in years past.  She has also been admitted to the hospital for GI bleeding and colitis in the past, most recently in January 2024.  She has severe abdominal pain with rectal bleeding.  CT scan showed some left-sided colitis which was thought to be inflammatory in etiology.  She had a follow-up colonoscopy with Dr. Saintclair in April 2024.  No report of that on file, she thinks she had multiple polyps removed and was told to repeat the exam in 3 years.  She was told she does not have any evidence of IBD.  The last issue she has had with her bowels was in June of this past year she had some abdominal pain, CT scan showed some enteritis that appeared infectious versus inflammatory.  Her symptoms resolved with conservative measures.  She states she has not really had any problems with her bowels since that time.  She has not had any recurrent diverticulitis.  She takes MiraLAX  every morning which provides 1 bowel movement per day.  She is constipated and has a bowel movement every 3 days to week if she does not take anything.  She has been happy with this regimen and this is working for her.  The main complaint she has today is that of hemorrhoids.  She states that are bothersome and have had some longstanding periodic bleeding from this.  She is quite interested in hemorrhoid banding.  It sounds like she has grade 2 prolapse with her bowel movements and some irritation from hemorrhoids that bother her.   She denies any rectal pain.  She and her husband inquired about hemorrhoid banding.  She otherwise reports she had an abnormal cardiac CT with a high calcium  score done in a different healthcare system recently.  I cannot see this report in care everywhere.  She has been endorsing some exertional dyspnea and chest pain which can radiate to her jaw which led to this evaluation.  She has fatigue and decreased stamina with exertion.  She is scheduled to see a cardiologist for this tomorrow.   Prior workup: Colonoscopy 01/2023 - Dr. Saintclair - repeat in 3 years - no formal report on file   EGD 07/24/2019 - Dr. Dianna - diminutive prepyloric ulcer - no path on file   CT abdomen / pelvis 03/23/2023 - IMPRESSION: 1. Moderate length segment of small bowel wall thickening with intraluminal fluid and mesenteric edema in the pelvis, consistent with enteritis, likely infectious or inflammatory. Given patient previously had colitis, the possibility of inflammatory bowel disease such as Crohn's disease is raised. 2. No hydronephrosis or obstructive uropathy. 3. Hepatic steatosis. 4. Small hiatal hernia. Mild distal colonic diverticulosis without diverticulitis.   Aortic Atherosclerosis (ICD10-I70.0).   CT abdomen / pelvis 10/28/22: IMPRESSION: 1. Mild decompression of the descending colon with minimal submucosal edema as findings may be due to a mild acute colitis of infectious or inflammatory nature. 2. Mild diverticulosis of the sigmoid colon without active inflammation. 3.  Mild hepatic steatosis without focal mass. 4. Small sliding hiatal hernia with surgical clips adjacent the hiatal hernia/gastroesophageal junction. 5. Aortic atherosclerosis.   Aortic Atherosclerosis (ICD10-I70.0).   CTA 10/28/22: IMPRESSION: VASCULAR   1. No evidence of active gastrointestinal bleeding. 2.  Aortic Atherosclerosis (ICD10-I70.0).   NON-VASCULAR   1. Mural thickening and pericolonic fat stranding  involving the descending and proximal sigmoid colon, consistent with inflammatory or infectious colitis. 2. Rectosigmoid diverticulosis without diverticulitis. 3. Hepatic steatosis. 4. Small hiatal hernia.  Suspected to have ischemic colitis at that time   CT enterography 07/25/2019: IMPRESSION: 1. Acute diverticulitis of the small bowel involving a right pelvic loop of ileum. Previously noted tiny foci of free air in the adjacent mesentery on recent 07/22/2019 CT study have resolved. No discrete abscess. Inflammatory changes otherwise not substantially changed in the interval. 2. Numerous tiny diverticula throughout the mid to distal ileum. Mild sigmoid diverticulosis. No colonic diverticulitis. 3. Small hiatal hernia 4.  Aortic Atherosclerosis (ICD10-I70.0).       Past Medical History:  Diagnosis Date   Anxiety    Colitis    Depression    Diverticulitis    Hyperlipidemia    Hypertension    Hypothyroidism    IBS (irritable bowel syndrome)    Malignant melanoma (HCC)    PVC's (premature ventricular contractions)      Past Surgical History:  Procedure Laterality Date   BLADDER REPAIR     ESOPHAGOGASTRODUODENOSCOPY (EGD) WITH PROPOFOL  N/A 07/24/2019   Procedure: ESOPHAGOGASTRODUODENOSCOPY (EGD) WITH PROPOFOL ;  Surgeon: Dianna Specking, MD;  Location: [facility];  Service: Endoscopy;  Laterality: N/A;   FOOT SURGERY Bilateral    HERNIA REPAIR     MELANOMA EXCISION     VAGINAL HYSTERECTOMY     Family History  Problem Relation Age of Onset   Heart attack Mother        stroke and cancer   Hypertension Mother    Throat cancer Mother    Coronary artery disease Mother    Lung cancer Mother    Cancer Father    Diabetes Father    Hypertension Father    CAD Father    Heart attack Father        heart attack and died at 76   Hypertension Sister    Hypertension Brother    Breast cancer Paternal Grandfather    Social History   Tobacco Use   Smoking status:  Never   Smokeless tobacco: Never  Vaping Use   Vaping status: Never Used  Substance Use Topics   Alcohol use: No   Drug use: No   Current Outpatient Medications  Medication Sig Dispense Refill   ALPRAZolam  (XANAX ) 1 MG tablet Take 1 mg by mouth in the morning and at bedtime.     amLODipine  (NORVASC ) 5 MG tablet Take 5 mg by mouth in the morning.     atorvastatin  (LIPITOR) 40 MG tablet Take 40 mg by mouth every Monday, Wednesday, and Friday.     Cholecalciferol (VITAMIN D3 PO) Take 1 capsule by mouth daily.     cyclobenzaprine  (FLEXERIL ) 10 MG tablet Take 1 tablet (10 mg total) by mouth 2 (two) times daily as needed for muscle spasms. 30 tablet 0   escitalopram (LEXAPRO) 10 MG tablet Take 10 mg by mouth daily.     furosemide (LASIX) 20 MG tablet Take 20 mg by mouth daily as needed for fluid or edema.     hydrochlorothiazide  (HYDRODIURIL ) 25 MG tablet Take 25 mg by  mouth daily.     levothyroxine  (SYNTHROID ) 50 MCG tablet Take 50 mcg by mouth daily before breakfast.      montelukast  (SINGULAIR ) 10 MG tablet Take 10 mg by mouth daily.     polyethylene glycol (MIRALAX ) 17 g packet Take 17 g by mouth daily. 14 each 0   potassium chloride  SA (KLOR-CON  M) 20 MEQ tablet Take 1 tablet (20 mEq total) by mouth daily. Take along with hydrochlorothiazide  (Patient taking differently: Take 20 mEq by mouth See admin instructions. Take 20 mEq by mouth once a day only when taking Furosemide) 30 tablet 2   TYLENOL  500 MG tablet Take 500-1,000 mg by mouth every 6 (six) hours as needed for mild pain or headache.     zolpidem  (AMBIEN ) 10 MG tablet Take 10 mg by mouth at bedtime as needed for sleep.      albuterol  (PROVENTIL ) (2.5 MG/3ML) 0.083% nebulizer solution Take 3 mLs (2.5 mg total) by nebulization every 6 (six) hours as needed for wheezing or shortness of breath. (Patient not taking: Reported on 10/20/2023) 75 mL 2   baclofen  (LIORESAL ) 10 MG tablet Take 1 tablet (10 mg total) by mouth 3 (three) times daily  as needed for muscle spasms. (Patient not taking: Reported on 04/26/2023) 30 each 3   cephALEXin  (KEFLEX ) 500 MG capsule Take 1 capsule (500 mg total) by mouth 2 (two) times daily. (Patient not taking: Reported on 04/26/2023) 14 capsule 0   dicyclomine  (BENTYL ) 20 MG tablet Take 1 tablet (20 mg total) by mouth 2 (two) times daily. (Patient not taking: Reported on 04/26/2023) 20 tablet 0   fluconazole  (DIFLUCAN ) 150 MG tablet Take 1 tablet (150 mg total) by mouth every other day. (Patient not taking: Reported on 04/26/2023) 3 tablet 0   Ketotifen Fumarate (ALAWAY OP) Place 1 drop into both eyes 4 (four) times daily as needed (for allergies or irritation). (Patient not taking: Reported on 10/20/2023)     promethazine  (PHENERGAN ) 25 MG tablet Take 25 mg by mouth 2 (two) times daily as needed for nausea or vomiting. (Patient not taking: Reported on 10/20/2023)     triamcinolone  cream (KENALOG ) 0.1 % Apply 1 Application topically 2 (two) times daily. (Patient not taking: Reported on 10/20/2023) 80 g 0   No current facility-administered medications for this visit.   Allergies  Allergen Reactions   Flagyl [Metronidazole] Hives and Other (See Comments)    Hives and welts   Codeine Nausea And Vomiting   Penicillins Swelling and Other (See Comments)    Pt states she had some hand swelling from pcn ~ 50 yrs ago when she was pregnant    Erythromycin Diarrhea and Other (See Comments)    Irritability, diarrhea, yeast infection.    Lisinopril Hives   Tramadol  Other (See Comments)    Dizziness   Sulfa Antibiotics Nausea And Vomiting     Review of Systems: All systems reviewed and negative except where noted in HPI.   Lab Results  Component Value Date   WBC 13.7 (H) 03/23/2023   HGB 14.4 03/23/2023   HCT 40.7 03/23/2023   MCV 89.8 03/23/2023   PLT 245 03/23/2023    Lab Results  Component Value Date   ALT 19 03/23/2023   AST 15 03/23/2023   ALKPHOS 72 03/23/2023   BILITOT 1.2 03/23/2023    Lab  Results  Component Value Date   NA 139 03/23/2023   CL 104 03/23/2023   K 3.5 03/23/2023   CO2 24 03/23/2023  BUN 11 03/23/2023   CREATININE 0.69 03/23/2023   GFRNONAA >60 03/23/2023   CALCIUM  9.1 03/23/2023   PHOS 2.9 07/25/2019   ALBUMIN 4.0 03/23/2023   GLUCOSE 121 (H) 03/23/2023     Physical Exam: BP 108/64   Pulse 64   Ht 5' 4 (1.626 m)   Wt 180 lb (81.6 kg)   SpO2 99%   BMI 30.90 kg/m  Constitutional: Pleasant, female in no acute distress. HEENT: Normocephalic and atraumatic. Conjunctivae are normal. No scleral icterus. Neck supple.  Cardiovascular: Normal rate, regular rhythm.  Pulmonary/chest: Effort normal and breath sounds normal.  Abdominal: Soft, nondistended, nontender. There are no masses palpable. Anoscopy / DRE - CMA Madison Favre as standby - no fissure, hemorrhoids noted, no mass lesions Extremities: no edema Neurological: Alert and oriented to person place and time. Psychiatric: Normal mood and affect. Behavior is normal.   ASSESSMENT: 78 y.o. female here for assessment of the following  1. Grade II hemorrhoids   2. Chronic constipation   3. History of diverticulitis   4. History of colitis   5. History of colon polyps   6. Dyspnea, unspecified type    Patient with chronic constipation associated with grade 2 symptomatic hemorrhoids which have been bothering her for some time.  Constipation is being appropriately treated with MiraLAX  and she is happy with the regimen.  Despite this her hemorrhoidal symptoms persist.  Anoscopy performed today, I do think she is a candidate for hemorrhoid banding, however, in light of her exertional symptoms and need for cardiology evaluation, I do not think is a good time to be performing elective hemorrhoid banding given the potential risks of bleeding which could stress her heart should that occur.  I recommend she seek evaluation with cardiologist first and be cleared from their evaluation prior to proceeding with  hemorrhoid banding.  She is in agreement and will continue MiraLAX  for now.  I recommend Calmol 4 suppositories as needed for hemorrhoids, free samples provided.  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  She is at risk for recurrence of all of these.  So far has been doing well on bowel regimen over the past 6 months.  She will contact me with any recurrence of symptoms that are bothersome to her.  I will get her last colonoscopy report from [facility] to clarify timing of her next exam, which appears to be 3 years from her last.  She will contact me when she is ready to schedule for hemorrhoid banding, await cardiology evaluation   PLAN: - continue Miralax  daily - Calmol4 samples provided to use PRN - offered hemorrhoid banding, but will await cardiology consultation first - she has exertional dyspnea and chest pain, abnormal cardiac CT, seeing cardiology tomorrow - once cleared by cardiology or completed workup will contact us  for scheduling - counseled on diverticulitis and ischemic colitis, call if recurrent symptoms - will obtain colonoscopy from from [facility] - repeat colonoscopy 3 years from last exam  Marcey Naval, MD [facility]  CCL Pahwani, Velna SAUNDERS, MD

## 2023-10-20 NOTE — Patient Instructions (Addendum)
 Continue Miralax  once daily.  We have given you samples of the following medication to take: Calmol 4 suppositories  Proceed with your Cardiology appointment tomorrow and once cleared, please call back when you would like to be scheduled for a hemorrhoid banding.  We will request your records from [facility] of your last colonoscopy procedure.  Thank you for entrusting me with your care and for choosing [facility], Dr. Elspeth Naval   If your blood pressure at your visit was 140/90 or greater, please contact your primary care physician to follow up on this. ______________________________________________________  If you are age 78 or older, your body mass index should be between 23-30. Your Body mass index is 30.9 kg/m. If this is out of the aforementioned range listed, please consider follow up with your Primary Care Provider.  If you are age 70 or younger, your body mass index should be between 19-25. Your Body mass index is 30.9 kg/m. If this is out of the aformentioned range listed, please consider follow up with your Primary Care Provider.  ________________________________________________________  The [facility] providers would like to encourage you to use MYCHART to communicate with providers for non-urgent requests or questions.  Due to long hold times on the telephone, sending your provider a message by [facility] may be a faster and more efficient way to get a response.  Please allow 48 business hours for a response.  Please remember that this is for non-urgent requests.  _______________________________________________________  Due to recent changes in healthcare laws, you may see the results of your imaging and laboratory studies on MyChart before your provider has had a chance to review them.  We understand that in some cases there may be results that are confusing or concerning to you. Not all laboratory results come back in the same time frame and the provider may be  waiting for multiple results in order to interpret others.  Please give us  48 hours in order for your provider to thoroughly review all the results before contacting the office for clarification of your results.

## 2023-10-21 ENCOUNTER — Ambulatory Visit (HOSPITAL_BASED_OUTPATIENT_CLINIC_OR_DEPARTMENT_OTHER): Payer: PPO | Admitting: Nurse Practitioner

## 2023-10-21 ENCOUNTER — Encounter (HOSPITAL_BASED_OUTPATIENT_CLINIC_OR_DEPARTMENT_OTHER): Payer: Self-pay | Admitting: Nurse Practitioner

## 2023-10-21 VITALS — BP 126/60 | HR 55 | Ht 64.0 in | Wt 182.7 lb

## 2023-10-21 DIAGNOSIS — I10 Essential (primary) hypertension: Secondary | ICD-10-CM | POA: Diagnosis not present

## 2023-10-21 DIAGNOSIS — I25110 Atherosclerotic heart disease of native coronary artery with unstable angina pectoris: Secondary | ICD-10-CM | POA: Diagnosis not present

## 2023-10-21 DIAGNOSIS — E785 Hyperlipidemia, unspecified: Secondary | ICD-10-CM

## 2023-10-21 DIAGNOSIS — I6523 Occlusion and stenosis of bilateral carotid arteries: Secondary | ICD-10-CM | POA: Diagnosis not present

## 2023-10-21 DIAGNOSIS — I493 Ventricular premature depolarization: Secondary | ICD-10-CM

## 2023-10-21 DIAGNOSIS — I200 Unstable angina: Secondary | ICD-10-CM

## 2023-10-21 MED ORDER — NITROGLYCERIN 0.4 MG SL SUBL: 0.4000 mg | SUBLINGUAL_TABLET | SUBLINGUAL | 3 refills | Status: DC | PRN

## 2023-10-21 MED ORDER — ASPIRIN 81 MG PO TBEC: 81.0000 mg | DELAYED_RELEASE_TABLET | Freq: Every day | ORAL | Status: DC

## 2023-10-21 NOTE — Patient Instructions (Signed)
 Medication Instructions:     If a single episode of chest pain is not relieved by one tablet, the patient will try another within 5 minutes; and if this doesn't relieve the pain, the patient will try another within 5 minutes and if this doesn't relieve the pain the patient is instructed to call 911 for transportation to an emergency department.   START Asprin one (1) tablet by mouth ( 81 mg) daily.    *If you need a refill on your cardiac medications before your next appointment, please call your pharmacy*   Lab Work:  TODAY!!!!! CMET/LIPID/CBC  If you have labs (blood work) drawn today and your tests are completely normal, you will receive your results only by: MyChart Message (if you have MyChart) OR A paper copy in the mail If you have any lab test that is abnormal or we need to change your treatment, we will call you to review the results.   Testing/Procedures:  Your physician has requested that you have a carotid duplex. This test is an ultrasound of the carotid arteries in your neck. It looks at blood flow through these arteries that supply the brain with blood. Allow one hour for this exam. There are no restrictions or special instructions.        Cardiac/Peripheral Catheterization   You are scheduled for a Cardiac Catheterization on Friday, January 10 with Dr. Peter Jordan.  1. Please arrive at the [facility] (Main Entrance A) at [facility]: [address] [place], [place] [address] at 7:00 AM (This time is 2 hour(s) before your procedure to ensure your preparation).   Free valet parking service is available. You will check in at ADMITTING. The support person will be asked to wait in the waiting room.  It is OK to have someone drop you off and come back when you are ready to be discharged.        Special note: Every effort is made to have your procedure done on time. Please understand that emergencies sometimes delay scheduled procedures.  2. Diet: Do not eat  solid foods after midnight.  You may have clear liquids until 5 AM the day of the procedure.  3. Labs: You will need to have blood drawn on Wednesday, January 8 at [facility] at [facility]:  [address] [place], [place] [address] (lab is in the Primary Care office located on the 3rd floor).  Hours: 8:00 am - 4:30 pm (avoid 12:00 pm - 1:00 pm)  Phone: [phone].  Tell staff you are there for blood work and they will direct you to the lab.  You do not need an appointment. You do not need to be fasting.  4. Medication instructions in preparation for your procedure:   Contrast Allergy: No   On the morning of your procedure, take Aspirin  81 mg and any morning medicines NOT listed above.  You may use sips of water .  5. Plan to go home the same day, you will only stay overnight if medically necessary. 6. You MUST have a responsible adult to drive you home. 7. An adult MUST be with you the first 24 hours after you arrive home. 8. Bring a current list of your medications, and the last time and date medication taken. 9. Bring ID and current insurance cards. 10.Please wear clothes that are easy to get on and off and wear slip-on shoes.  Thank you for allowing us  to care for you!   -- [facility]     Follow-Up: At [facility], you and your health needs are our priority.  As part of our continuing mission to provide you with exceptional heart care, we have created designated Provider Care Teams.  These Care Teams include your primary Cardiologist (physician) and Advanced Practice Providers (APPs -  Physician Assistants and Nurse Practitioners) who all work together to provide you with the care you need, when you need it.  We recommend signing up for the patient portal called MyChart.  Sign up information is provided on this After Visit Summary.  MyChart is used to connect with patients for Virtual Visits (Telemedicine).   Patients are able to view lab/test results, encounter notes, upcoming appointments, etc.  Non-urgent messages can be sent to your provider as well.   To learn more about what you can do with MyChart, go to [email or web address].    Your next appointment:   4 week(s)  Provider:   Maude Rides, MD    Goals: 1: Recommend limiting activity until evaluation of symptoms concerning for angina is complete

## 2023-10-22 ENCOUNTER — Telehealth: Payer: Self-pay | Admitting: *Deleted

## 2023-10-22 ENCOUNTER — Encounter (HOSPITAL_BASED_OUTPATIENT_CLINIC_OR_DEPARTMENT_OTHER): Payer: Self-pay

## 2023-10-22 LAB — CBC
Hematocrit: 42.8 % (ref 34.0–46.6)
Hemoglobin: 14.5 g/dL (ref 11.1–15.9)
MCH: 31.5 pg (ref 26.6–33.0)
MCHC: 33.9 g/dL (ref 31.5–35.7)
MCV: 93 fL (ref 79–97)
Platelets: 222 10*3/uL (ref 150–450)
RBC: 4.61 x10E6/uL (ref 3.77–5.28)
RDW: 12.1 % (ref 11.7–15.4)
WBC: 9 10*3/uL (ref 3.4–10.8)

## 2023-10-22 LAB — COMPREHENSIVE METABOLIC PANEL
ALT: 23 [IU]/L (ref 0–32)
AST: 21 [IU]/L (ref 0–40)
Albumin: 4.4 g/dL (ref 3.8–4.8)
Alkaline Phosphatase: 124 [IU]/L — ABNORMAL HIGH (ref 44–121)
BUN/Creatinine Ratio: 15 (ref 12–28)
BUN: 11 mg/dL (ref 8–27)
Bilirubin Total: 0.7 mg/dL (ref 0.0–1.2)
CO2: 25 mmol/L (ref 20–29)
Calcium: 9.6 mg/dL (ref 8.7–10.3)
Chloride: 103 mmol/L (ref 96–106)
Creatinine, Ser: 0.75 mg/dL (ref 0.57–1.00)
Globulin, Total: 2.5 g/dL (ref 1.5–4.5)
Glucose: 105 mg/dL — ABNORMAL HIGH (ref 70–99)
Potassium: 4 mmol/L (ref 3.5–5.2)
Sodium: 145 mmol/L — ABNORMAL HIGH (ref 134–144)
Total Protein: 6.9 g/dL (ref 6.0–8.5)
eGFR: 82 mL/min/{1.73_m2} (ref >59)

## 2023-10-22 LAB — LIPID PANEL
Chol/HDL Ratio: 3.4 {ratio} (ref 0.0–4.4)
Cholesterol, Total: 151 mg/dL (ref 100–199)
HDL: 45 mg/dL (ref >39)
LDL Chol Calc (NIH): 81 mg/dL (ref 0–99)
Triglycerides: 144 mg/dL (ref 0–149)
VLDL Cholesterol Cal: 25 mg/dL (ref 5–40)

## 2023-10-22 NOTE — Telephone Encounter (Signed)
 Cardiac Catheterization scheduled at [facility] for: Friday October 23, 2023 9 AM Arrival time [facility] Main Entrance A at: 7 AM  Nothing to eat after midnight prior to procedure, clear liquids until 5 AM day of procedure.  Medication instructions: -Hold:  Hydrochlorothiazide /KCl/Lasix-AM of procedure -Other usual morning medications can be taken with sips of water  including aspirin  81 mg.  Plan to go home the same day, you will only stay overnight if medically necessary.  You must have responsible adult to drive you home.  Someone must be with you the first 24 hours after you arrive home.  Reviewed procedure instructions with patient.

## 2023-10-23 ENCOUNTER — Ambulatory Visit (HOSPITAL_COMMUNITY)
Admission: RE | Admit: 2023-10-23 | Discharge: 2023-10-23 | Disposition: A | Discharge status: Home / Self Care | Payer: PPO | Attending: Cardiology | Admitting: Cardiology

## 2023-10-23 ENCOUNTER — Other Ambulatory Visit: Payer: Self-pay

## 2023-10-23 ENCOUNTER — Ambulatory Visit (HOSPITAL_COMMUNITY)
Admission: RE | Disposition: A | Discharge status: Home / Self Care | Payer: Self-pay | Source: Home / Self Care | Attending: Cardiology

## 2023-10-23 DIAGNOSIS — Z8249 Family history of ischemic heart disease and other diseases of the circulatory system: Secondary | ICD-10-CM | POA: Diagnosis not present

## 2023-10-23 DIAGNOSIS — I6523 Occlusion and stenosis of bilateral carotid arteries: Secondary | ICD-10-CM | POA: Diagnosis not present

## 2023-10-23 DIAGNOSIS — E785 Hyperlipidemia, unspecified: Secondary | ICD-10-CM | POA: Diagnosis not present

## 2023-10-23 DIAGNOSIS — I25110 Atherosclerotic heart disease of native coronary artery with unstable angina pectoris: Secondary | ICD-10-CM

## 2023-10-23 DIAGNOSIS — R079 Chest pain, unspecified: Secondary | ICD-10-CM

## 2023-10-23 DIAGNOSIS — I10 Essential (primary) hypertension: Secondary | ICD-10-CM | POA: Insufficient documentation

## 2023-10-23 DIAGNOSIS — I493 Ventricular premature depolarization: Secondary | ICD-10-CM | POA: Diagnosis not present

## 2023-10-23 DIAGNOSIS — Z7982 Long term (current) use of aspirin: Secondary | ICD-10-CM | POA: Diagnosis not present

## 2023-10-23 DIAGNOSIS — I200 Unstable angina: Secondary | ICD-10-CM

## 2023-10-23 DIAGNOSIS — I25119 Atherosclerotic heart disease of native coronary artery with unspecified angina pectoris: Secondary | ICD-10-CM | POA: Insufficient documentation

## 2023-10-23 DIAGNOSIS — Z79899 Other long term (current) drug therapy: Secondary | ICD-10-CM | POA: Diagnosis not present

## 2023-10-23 HISTORY — PX: LEFT HEART CATH AND CORONARY ANGIOGRAPHY: CATH118249

## 2023-10-23 SURGERY — LEFT HEART CATH AND CORONARY ANGIOGRAPHY
Anesthesia: LOCAL

## 2023-10-23 MED ORDER — FENTANYL CITRATE (PF) 100 MCG/2ML IJ SOLN
INTRAMUSCULAR | Status: AC
  Filled 2023-10-23: qty 2

## 2023-10-23 MED ORDER — HEPARIN SODIUM (PORCINE) 1000 UNIT/ML IJ SOLN
INTRAMUSCULAR | Status: AC
  Filled 2023-10-23: qty 10

## 2023-10-23 MED ORDER — ASPIRIN 81 MG PO CHEW: 81.0000 mg | CHEWABLE_TABLET | ORAL | Status: DC

## 2023-10-23 MED ORDER — SODIUM CHLORIDE 0.9 % WEIGHT BASED INFUSION: 1.0000 mL/kg/h | INTRAVENOUS | Status: DC

## 2023-10-23 MED ORDER — SODIUM CHLORIDE 0.9% FLUSH: 3.0000 mL | Freq: Two times a day (BID) | INTRAVENOUS | Status: DC

## 2023-10-23 MED ORDER — ONDANSETRON HCL 4 MG/2ML IJ SOLN: 4.0000 mg | Freq: Four times a day (QID) | INTRAMUSCULAR | Status: DC | PRN

## 2023-10-23 MED ORDER — LIDOCAINE HCL (PF) 1 % IJ SOLN
INTRAMUSCULAR | Status: AC
Start: 2023-10-23 — End: ?
  Filled 2023-10-23: qty 30

## 2023-10-23 MED ORDER — HEPARIN (PORCINE) IN NACL 1000-0.9 UT/500ML-% IV SOLN
INTRAVENOUS | Status: DC | PRN
  Administered 2023-10-23: 1000 mL

## 2023-10-23 MED ORDER — SODIUM CHLORIDE 0.9% FLUSH: 3.0000 mL | INTRAVENOUS | Status: DC | PRN

## 2023-10-23 MED ORDER — LIDOCAINE HCL (PF) 1 % IJ SOLN
INTRAMUSCULAR | Status: AC
  Filled 2023-10-23: qty 30

## 2023-10-23 MED ORDER — SODIUM CHLORIDE 0.9 % IV SOLN: 250.0000 mL | INTRAVENOUS | Status: DC | PRN

## 2023-10-23 MED ORDER — HEPARIN SODIUM (PORCINE) 1000 UNIT/ML IJ SOLN
INTRAMUSCULAR | Status: DC | PRN
  Administered 2023-10-23: 4000 [IU] via INTRAVENOUS

## 2023-10-23 MED ORDER — FENTANYL CITRATE (PF) 100 MCG/2ML IJ SOLN
INTRAMUSCULAR | Status: DC | PRN
  Administered 2023-10-23: 25 ug via INTRAVENOUS

## 2023-10-23 MED ORDER — VERAPAMIL HCL 2.5 MG/ML IV SOLN
INTRAVENOUS | Status: DC | PRN
  Administered 2023-10-23: 10 mL via INTRA_ARTERIAL

## 2023-10-23 MED ORDER — MIDAZOLAM HCL 2 MG/2ML IJ SOLN
INTRAMUSCULAR | Status: DC | PRN
  Administered 2023-10-23: 2 mg via INTRAVENOUS

## 2023-10-23 MED ORDER — VERAPAMIL HCL 2.5 MG/ML IV SOLN
INTRAVENOUS | Status: AC
  Filled 2023-10-23: qty 2

## 2023-10-23 MED ORDER — MIDAZOLAM HCL 2 MG/2ML IJ SOLN
INTRAMUSCULAR | Status: AC
  Filled 2023-10-23: qty 2

## 2023-10-23 MED ORDER — HYDRALAZINE HCL 20 MG/ML IJ SOLN: 10.0000 mg | INTRAMUSCULAR | Status: DC | PRN

## 2023-10-23 MED ORDER — IOHEXOL 350 MG/ML SOLN
INTRAVENOUS | Status: DC | PRN
  Administered 2023-10-23: 40 mL

## 2023-10-23 MED ORDER — LIDOCAINE HCL (PF) 1 % IJ SOLN
INTRAMUSCULAR | Status: DC | PRN
  Administered 2023-10-23: 2 mL

## 2023-10-23 MED ORDER — SODIUM CHLORIDE 0.9 % WEIGHT BASED INFUSION: 3.0000 mL/kg/h | INTRAVENOUS | Status: AC

## 2023-10-23 MED ORDER — ACETAMINOPHEN 325 MG PO TABS: 650.0000 mg | ORAL_TABLET | ORAL | Status: DC | PRN

## 2023-10-23 SURGICAL SUPPLY — 7 items
CATH 5FR JL3.5 JR4 ANG PIG MP (CATHETERS) IMPLANT
GLIDESHEATH SLEND SS 6F .021 (SHEATH) IMPLANT
GUIDEWIRE INQWIRE 1.5J.035X260 (WIRE) IMPLANT
INQWIRE 1.5J .035X260CM (WIRE) ×1
PACK CARDIAC CATHETERIZATION (CUSTOM PROCEDURE TRAY) ×1 IMPLANT
SET ATX-X65L (MISCELLANEOUS) IMPLANT
SHEATH PROBE COVER 6X72 (BAG) IMPLANT

## 2023-10-23 NOTE — Progress Notes (Signed)
 Patients TR band removed at 1100, gauze dressing applied. Right radial level 0, clean, dry, and intact. Patient walked to the bathroom without difficulties.

## 2023-10-23 NOTE — Discharge Instructions (Signed)

## 2023-10-23 NOTE — Interval H&P Note (Signed)
 History and Physical Interval Note:  10/23/2023 8:17 AM  [name]  has presented today for surgery, with the diagnosis of angina.  The various methods of treatment have been discussed with the patient and family. After consideration of risks, benefits and other options for treatment, the patient has consented to  Procedure(s): LEFT HEART CATH AND CORONARY ANGIOGRAPHY (N/A) as a surgical intervention.  The patient's history has been reviewed, patient examined, no change in status, stable for surgery.  I have reviewed the patient's chart and labs.  Questions were answered to the patient's satisfaction.   Cath Lab Visit (complete for each Cath Lab visit)  Clinical Evaluation Leading to the Procedure:   ACS: Yes.    Non-ACS:    Anginal Classification: CCS III  Anti-ischemic medical therapy: Minimal Therapy (1 class of medications)  Non-Invasive Test Results: No non-invasive testing performed  Prior CABG: No previous CABG        Maude [facility] 10/23/2023 8:17 AM

## 2023-10-26 ENCOUNTER — Encounter (HOSPITAL_COMMUNITY): Payer: Self-pay | Admitting: Cardiology

## 2023-10-26 ENCOUNTER — Telehealth: Payer: Self-pay | Admitting: Cardiology

## 2023-10-26 MED FILL — Lidocaine HCl Local Preservative Free (PF) Inj 1%: INTRAMUSCULAR | Qty: 30 | Status: AC

## 2023-10-26 NOTE — Telephone Encounter (Signed)
 Called and spoke with patient. She has basically two questions that need to be answered. 1.  Does she need to keep her follow up appointment with Dr Jordan already scheduled for Feb 4th. 2. In the discharge summary note from cardiac cath report it states that she to consider other sources of cardiac pain. Who exactly did Dr Jordan want her to follow up? What is the next step of her care evaluation.  Instructed patient I would send message to Dr Jordan and would let her know his recommendations.

## 2023-10-26 NOTE — Telephone Encounter (Signed)
 Called and left a VMM for patient to call triage back at [phone].

## 2023-10-26 NOTE — Telephone Encounter (Signed)
 Patient is calling with questions in regards to her cath procedure she had on 10/23/23. Please advise.

## 2023-10-27 NOTE — Telephone Encounter (Signed)
 Called and spoke to patient. Verified name and DOB. Below message relayed per Dr Jordan. Patient verbalized understanding and agree. No questions at this time.   She should keep appt with me for post cath check. As regards her chest pain she should follow up with her PCP since it does not appear to be cardiac  Peter Jordan MD, FACC

## 2023-10-27 NOTE — Telephone Encounter (Signed)
Pt calling back for an update  

## 2023-11-04 ENCOUNTER — Telehealth: Payer: Self-pay | Admitting: Gastroenterology

## 2023-11-04 NOTE — Telephone Encounter (Signed)
Attempted to reach patient. Phone goes straight to vm, lm on vm for patient to return call.  01/2026 colonoscopy recall in epic.  #1 Hem banding appt has been scheduled on Wednesday, 12/30/23 at 10:30 am.

## 2023-11-04 NOTE — Telephone Encounter (Signed)
[place] it appears this patient had a negative cardiac cath - can you please schedule her for a hemorrhoid banding if she wants to proceed. Thanks  Can you also place recall for colonoscopy 01/2026 if there isn't one already in there.  Her prior colonoscopy report arrived and is as outlined below. Thanks    Colonoscopy 01/14/23 - Dr. Marca Ancona - good prep. 2 x 4-2mm rectal polyps removed. 2 ascending polyps 5-42mm in size, 3mm transverse polyp. Diverticulosis. Internal hemorrhoids. Rectal polyps hyperplastic, the transverse / ascending polyps were adenomatous

## 2023-11-04 NOTE — Telephone Encounter (Signed)
Returned call to patient. Pt is aware of recall and hemorrhoid banding appt. I reviewed expectations of hemorrhoid banding, pt is aware that she can drive herself. Pt verbalized understanding and had no concerns at the end of the call.

## 2023-11-04 NOTE — Telephone Encounter (Signed)
Patient called and stated that she was return a call to [place]. Patient is requesting a call back. Please advise.

## 2023-11-05 ENCOUNTER — Other Ambulatory Visit (HOSPITAL_BASED_OUTPATIENT_CLINIC_OR_DEPARTMENT_OTHER): Payer: Self-pay | Admitting: Nurse Practitioner

## 2023-11-05 DIAGNOSIS — I493 Ventricular premature depolarization: Secondary | ICD-10-CM

## 2023-11-05 DIAGNOSIS — E785 Hyperlipidemia, unspecified: Secondary | ICD-10-CM

## 2023-11-05 DIAGNOSIS — I6523 Occlusion and stenosis of bilateral carotid arteries: Secondary | ICD-10-CM

## 2023-11-05 DIAGNOSIS — I10 Essential (primary) hypertension: Secondary | ICD-10-CM

## 2023-11-05 DIAGNOSIS — I25110 Atherosclerotic heart disease of native coronary artery with unstable angina pectoris: Secondary | ICD-10-CM

## 2023-11-05 DIAGNOSIS — I200 Unstable angina: Secondary | ICD-10-CM

## 2023-11-06 ENCOUNTER — Ambulatory Visit (HOSPITAL_COMMUNITY)
Admission: RE | Admit: 2023-11-06 | Discharge: 2023-11-06 | Disposition: A | Discharge status: Home / Self Care | Payer: PPO | Source: Ambulatory Visit | Attending: Internal Medicine

## 2023-11-06 DIAGNOSIS — I6523 Occlusion and stenosis of bilateral carotid arteries: Secondary | ICD-10-CM | POA: Diagnosis present

## 2023-11-07 ENCOUNTER — Encounter (HOSPITAL_BASED_OUTPATIENT_CLINIC_OR_DEPARTMENT_OTHER): Payer: Self-pay

## 2023-11-13 ENCOUNTER — Emergency Department (HOSPITAL_COMMUNITY): Payer: PPO

## 2023-11-13 ENCOUNTER — Encounter (HOSPITAL_COMMUNITY): Payer: Self-pay | Admitting: Emergency Medicine

## 2023-11-13 ENCOUNTER — Emergency Department (HOSPITAL_COMMUNITY)
Admission: EM | Admit: 2023-11-13 | Discharge: 2023-11-13 | Disposition: A | Discharge status: Home / Self Care | Payer: PPO | Attending: Emergency Medicine | Admitting: Emergency Medicine

## 2023-11-13 ENCOUNTER — Other Ambulatory Visit: Payer: Self-pay

## 2023-11-13 DIAGNOSIS — M546 Pain in thoracic spine: Secondary | ICD-10-CM | POA: Diagnosis present

## 2023-11-13 DIAGNOSIS — E039 Hypothyroidism, unspecified: Secondary | ICD-10-CM | POA: Diagnosis not present

## 2023-11-13 DIAGNOSIS — J45909 Unspecified asthma, uncomplicated: Secondary | ICD-10-CM | POA: Insufficient documentation

## 2023-11-13 DIAGNOSIS — Z7951 Long term (current) use of inhaled steroids: Secondary | ICD-10-CM | POA: Diagnosis not present

## 2023-11-13 DIAGNOSIS — I10 Essential (primary) hypertension: Secondary | ICD-10-CM | POA: Diagnosis not present

## 2023-11-13 DIAGNOSIS — Z79899 Other long term (current) drug therapy: Secondary | ICD-10-CM | POA: Diagnosis not present

## 2023-11-13 DIAGNOSIS — R079 Chest pain, unspecified: Secondary | ICD-10-CM | POA: Insufficient documentation

## 2023-11-13 LAB — CBC
HCT: 40.2 % (ref 36.0–46.0)
Hemoglobin: 14 g/dL (ref 12.0–15.0)
MCH: 30.7 pg (ref 26.0–34.0)
MCHC: 34.8 g/dL (ref 30.0–36.0)
MCV: 88.2 fL (ref 80.0–100.0)
Platelets: 226 10*3/uL (ref 150–400)
RBC: 4.56 MIL/uL (ref 3.87–5.11)
RDW: 12.4 % (ref 11.5–15.5)
WBC: 10.7 10*3/uL — ABNORMAL HIGH (ref 4.0–10.5)
nRBC: 0 % (ref 0.0–0.2)

## 2023-11-13 LAB — BASIC METABOLIC PANEL
Anion gap: 15 (ref 5–15)
BUN: 14 mg/dL (ref 8–23)
CO2: 24 mmol/L (ref 22–32)
Calcium: 9.3 mg/dL (ref 8.9–10.3)
Chloride: 101 mmol/L (ref 98–111)
Creatinine, Ser: 1.04 mg/dL — ABNORMAL HIGH (ref 0.44–1.00)
GFR, Estimated: 55 mL/min — ABNORMAL LOW (ref >60)
Glucose, Bld: 102 mg/dL — ABNORMAL HIGH (ref 70–99)
Potassium: 3.1 mmol/L — ABNORMAL LOW (ref 3.5–5.1)
Sodium: 140 mmol/L (ref 135–145)

## 2023-11-13 LAB — TROPONIN I (HIGH SENSITIVITY)
Troponin I (High Sensitivity): 5 ng/L (ref <18)
Troponin I (High Sensitivity): 5 ng/L (ref <18)

## 2023-11-13 LAB — D-DIMER, QUANTITATIVE: D-Dimer, Quant: 0.68 ug{FEU}/mL — ABNORMAL HIGH (ref 0.00–0.50)

## 2023-11-13 MED ORDER — IOHEXOL 350 MG/ML SOLN
75.0000 mL | Freq: Once | INTRAVENOUS | Status: AC | PRN
  Administered 2023-11-13: 75 mL via INTRAVENOUS

## 2023-11-13 MED ORDER — ACETAMINOPHEN 325 MG PO TABS
650.0000 mg | ORAL_TABLET | Freq: Four times a day (QID) | ORAL | Status: DC | PRN
  Administered 2023-11-13: 650 mg via ORAL
  Filled 2023-11-13: qty 2

## 2023-11-13 NOTE — Discharge Instructions (Signed)
You were seen today for back pain radiating to your chest and left arm and anterior neck.  I have low suspicion for any emergent pathology at this time as your labs and imaging were both very reassuring.  Recommend that you continue to keep your follow-up with cardiology on February 4.  If you begin to experience pain at rest despite nitro, shortness of breath, fever, blurred vision, feel faint, confused return to the ED for further evaluation.

## 2023-11-13 NOTE — ED Triage Notes (Signed)
Pt BIB EMS from home while working in garden pt has sudden onset chest tightness radiating to back, L arm, and R jaw, tingling to face and extremities, took 2 nitroglycerin and 324 ASA prior to EMS arrival. States she now feels dizzy and nauseous. Reports she feels "weird".  EMS VS:  147/104 HR 66 97% RA CBG 130

## 2023-11-13 NOTE — ED Notes (Signed)
Discharge instructions reviewed with patient. Patient questions answered and opportunity for education reviewed. Patient voices understanding of discharge instructions with no further questions. Patient ambulatory with steady gait to lobby.  

## 2023-11-13 NOTE — ED Provider Notes (Signed)
[place] EMERGENCY DEPARTMENT AT [facility] Provider Note   CSN: [id number] Arrival date & time: 11/13/23  1531     History  Chief Complaint  Patient presents with   Chest Pain    [name] is a 78 y.o. female.   Chest Pain Patient presents to the ED today complaining of mid back pain radiating to her chest, left arm, neck starting while she was moving logs this afternoon around 12 PM.  States that this was accompanied with blurry vision, vertigo, right lower extremity cramping. Previous medical history of hypothyroidism, anxiety, asthma, HTN, diverticulitis, upper GI bleed. States that she also felt short of breath and took a nitro which helped the pain a little.  States that she also was given 324 mg of ASA and repeat nitro when EMS picked her up.  Her pain has significantly decreased since that time.  Currently she says that her pain is mostly located in the central thoracic spine accompanied with chest pressure.  She has not ambulated since this event.  Denies fever, cough, congestion, abdominal pain, nausea, vomiting, diarrhea, lower extremity swelling, lower extremity pain, weakness, numbness.     Home Medications Prior to Admission medications   Medication Sig Start Date End Date Taking? Authorizing Provider  albuterol (VENTOLIN HFA) 108 (90 Base) MCG/ACT inhaler Inhale 2 puffs into the lungs every 6 (six) hours as needed (Asthma).   Yes [provider]  ALPRAZolam Prudy Feeler) 1 MG tablet Take 1 mg by mouth 3 (three) times a week. 05/16/14  Yes [provider]  amLODipine (NORVASC) 5 MG tablet Take 5 mg by mouth in the morning. 09/01/22  Yes [provider]  aspirin EC 81 MG tablet Take 1 tablet (81 mg total) by mouth daily. Swallow whole. 10/21/23  Yes Swinyer, Zachary George, NP  atorvastatin (LIPITOR) 40 MG tablet Take 40 mg by mouth every Monday, Wednesday, and Friday. 09/20/22  Yes [provider]  Cholecalciferol (VITAMIN D3)  125 MCG (5000 UT) TABS Take 5,000 Units by mouth daily.   Yes [provider]  escitalopram (LEXAPRO) 10 MG tablet Take 10 mg by mouth daily. 09/16/23  Yes [provider]  furosemide (LASIX) 20 MG tablet Take 20 mg by mouth daily as needed for fluid or edema. 06/26/22  Yes [provider]  hydrochlorothiazide (HYDRODIURIL) 25 MG tablet Take 25 mg by mouth daily. 05/11/14  Yes [provider]  levothyroxine (SYNTHROID) 50 MCG tablet Take 50 mcg by mouth daily before breakfast.  02/13/19  Yes [provider]  montelukast (SINGULAIR) 10 MG tablet Take 10 mg by mouth daily. 08/02/23  Yes [provider]  nitroGLYCERIN (NITROSTAT) 0.4 MG SL tablet Place 1 tablet (0.4 mg total) under the tongue every 5 (five) minutes as needed for chest pain. 10/21/23 01/19/24 Yes Swinyer, Zachary George, NP  Polyvinyl Alcohol (LUBRICANT DROPS OP) Place 1 drop into both eyes in the morning, at noon, in the evening, and at bedtime.   Yes [provider]  Potassium Chloride ER 20 MEQ TBCR Take 1 tablet by mouth daily as needed. 09/30/23  Yes [provider]  promethazine (PHENERGAN) 25 MG tablet Take 25 mg by mouth 2 (two) times daily as needed for nausea or vomiting.   Yes [provider]  TYLENOL 500 MG tablet Take 500 mg by mouth every 6 (six) hours as needed for mild pain (pain score 1-3) or headache.   Yes [provider]  zolpidem (AMBIEN) 10 MG  tablet Take 10 mg by mouth at bedtime. 03/22/19  Yes [provider]  dicyclomine (BENTYL) 20 MG tablet Take 1 tablet (20 mg total) by mouth 2 (two) times daily. Patient not taking: Reported on 10/21/2023 03/23/23   Glyn Ade, MD  polyethylene glycol (MIRALAX) 17 g packet Take 17 g by mouth daily. Patient not taking: Reported on 11/13/2023 03/23/23   Glyn Ade, MD  potassium chloride SA (KLOR-CON M) 20 MEQ tablet Take 1 tablet (20 mEq total) by mouth daily. Take along with  hydrochlorothiazide Patient not taking: Reported on 11/13/2023 10/29/22   Meredeth Ide, MD  triamcinolone cream (KENALOG) 0.1 % Apply 1 Application topically 2 (two) times daily. Patient not taking: Reported on 11/13/2023 04/11/23   Particia Nearing, PA-C      Allergies    Flagyl [metronidazole], Codeine, Penicillins, Erythromycin, Lisinopril, Tramadol, and Sulfa antibiotics    Review of Systems   Review of Systems  Cardiovascular:  Positive for chest pain.  All other systems reviewed and are negative.   Physical Exam Updated Vital Signs BP 131/65   Pulse 60   Temp 98.1 F (36.7 C) (Oral)   Resp 16   Ht 5\' 4"  (1.626 m)   Wt 82.6 kg   SpO2 98%   BMI 31.24 kg/m  Physical Exam Vitals and nursing note reviewed.  Constitutional:      General: She is not in acute distress.    Appearance: Normal appearance. She is not ill-appearing.  HENT:     Head: Normocephalic and atraumatic.  Eyes:     Extraocular Movements: Extraocular movements intact.     Conjunctiva/sclera: Conjunctivae normal.  Cardiovascular:     Rate and Rhythm: Normal rate and regular rhythm.     Pulses: Normal pulses.     Heart sounds: Normal heart sounds. No murmur heard.    No friction rub. No gallop.  Pulmonary:     Effort: Pulmonary effort is normal. No tachypnea, accessory muscle usage or respiratory distress.     Breath sounds: Normal breath sounds. No decreased breath sounds, wheezing, rhonchi or rales.  Abdominal:     General: Abdomen is flat.     Palpations: Abdomen is soft.     Tenderness: There is no abdominal tenderness.  Musculoskeletal:     Cervical back: Normal range of motion.     Right lower leg: Tenderness (mildly tenderness noted over R calf) present. No edema.     Left lower leg: No tenderness. No edema.  Skin:    General: Skin is warm and dry.     Coloration: Skin is not cyanotic or pale.     Findings: No erythema.  Neurological:     General: No focal deficit present.     Mental  Status: She is alert and oriented to person, place, and time. Mental status is at baseline.     Cranial Nerves: No cranial nerve deficit.     Motor: No weakness.     Comments: Unremarkable neuroexam with no arm drift.  Normal sensation, no visual field deficits.  Normal strength bilaterally.  Psychiatric:        Mood and Affect: Mood normal.     ED Results / Procedures / Treatments   Labs (all labs ordered are listed, but only abnormal results are displayed) Labs Reviewed  BASIC METABOLIC PANEL - Abnormal; Notable for the following components:      Result Value   Potassium 3.1 (*)    Glucose, Bld 102 (*)  Creatinine, Ser 1.04 (*)    GFR, Estimated 55 (*)    All other components within normal limits  CBC - Abnormal; Notable for the following components:   WBC 10.7 (*)    All other components within normal limits  D-DIMER, QUANTITATIVE - Abnormal; Notable for the following components:   D-Dimer, Quant 0.68 (*)    All other components within normal limits  TROPONIN I (HIGH SENSITIVITY)  TROPONIN I (HIGH SENSITIVITY)    EKG EKG Interpretation Date/Time:  Friday November 13 2023 15:53:40 EST Ventricular Rate:  63 PR Interval:  168 QRS Duration:  76 QT Interval:  478 QTC Calculation: 489 R Axis:   0  Text Interpretation: Normal sinus rhythm Minimal voltage criteria for LVH, may be normal variant ( R in aVL ) Borderline ECG When compared with ECG of 23-Oct-2023 07:34, No significant change since last tracing Confirmed by Benjiman Core [id number]) on 11/13/2023 5:50:06 PM  Radiology CT Angio Chest Aorta W and/or Wo Contrast Result Date: 11/13/2023 CLINICAL DATA:  Evaluate for acute aortic syndrome. Sudden onset of chest tightness that radiates to the back, left arm and right jaw. EXAM: CT ANGIOGRAPHY CHEST WITH CONTRAST TECHNIQUE: Multidetector CT imaging of the chest was performed using the standard protocol during bolus administration of intravenous contrast. Multiplanar CT  image reconstructions and MIPs were obtained to evaluate the vascular anatomy. RADIATION DOSE REDUCTION: This exam was performed according to the departmental dose-optimization program which includes automated exposure control, adjustment of the mA and/or kV according to patient size and/or use of iterative reconstruction technique. CONTRAST:  75mL OMNIPAQUE IOHEXOL 350 MG/ML SOLN COMPARISON:  None Available. FINDINGS: Cardiovascular: Preferential opacification of the thoracic aorta. No evidence of thoracic aortic aneurysm or dissection. Normal heart size. No pericardial effusion. Aortic atherosclerosis. Coronary artery calcifications. Mediastinum/Nodes: Thyroid gland, trachea, and esophagus demonstrate no significant findings. No enlarged mediastinal or hilar lymph nodes. Lungs/Pleura: No pleural effusion. No pneumothorax, airspace consolidation or atelectasis. Calcified granuloma noted in the left upper lobe. Upper Abdomen: No acute abnormality. Postoperative changes identified from previous hiatal hernia repair with signs of recurrent small hiatal hernia. Musculoskeletal: No chest wall abnormality. No acute or significant osseous findings. Review of the MIP images confirms the above findings. IMPRESSION: 1. No evidence for thoracic aortic aneurysm or dissection. 2. Coronary artery calcifications. 3. Postoperative changes identified from previous hiatal hernia repair with signs of recurrent small hiatal hernia. 4.  Aortic Atherosclerosis (ICD10-I70.0). Electronically Signed   By: Signa Kell M.D.   On: 11/13/2023 21:29   DG Chest 2 View Result Date: 11/13/2023 CLINICAL DATA:  Sudden onset chest tightness radiating to the back, left arm, and right jaw. Tingling to the face and extremities. EXAM: CHEST - 2 VIEW COMPARISON:  04/26/2023 FINDINGS: Shallow inspiration. Heart size and pulmonary vascularity are normal. Lungs are clear. No pleural effusions. No pneumothorax. Mediastinal contours appear intact.  Degenerative changes in the spine and shoulders. IMPRESSION: No active cardiopulmonary disease. Electronically Signed   By: Burman Nieves M.D.   On: 11/13/2023 17:47    Procedures Procedures    Medications Ordered in ED Medications  acetaminophen (TYLENOL) tablet 650 mg (650 mg Oral Given 11/13/23 2056)  iohexol (OMNIPAQUE) 350 MG/ML injection 75 mL (75 mLs Intravenous Contrast Given 11/13/23 2048)    ED Course/ Medical Decision Making/ A&P   {            HEART Score: 4  Medical Decision Making Amount and/or Complexity of Data Reviewed Labs: ordered. Radiology: ordered.  Risk OTC drugs. Prescription drug management.   This patient is a 78 year old female who presents to the ED for concern of chest pain radiating to back, left arm, right jaw starting today while working in the garden.   Differential diagnoses prior to evaluation: The emergent differential diagnosis includes, but is not limited to, ACS, PE, aortic dissection, pancreatitis, pneumothorax, Boerhaave's. This is not an exhaustive differential.   Past Medical History / Co-morbidities / Social History: Hypothyroidism, anxiety, asthma, hypertension, diverticulitis, upper GI bleed  Additional history: Chart reviewed. Pertinent results include:   left heart cath and coronary angiography done on 10/23/2023 noted to have an EF of 55 to 65% with mild nonobstructive CAD, mildly elevated LVEDP of 22 mmHg  Carotid ultrasound done on 11/06/2023 which showed near normal bilaterally.   Lab Tests/Imaging studies: I personally interpreted labs/imaging and the pertinent results include:   CBC was noted for slightly elevated WBC of 10.7 D-dimer 0.68 BMP notable for mild hypokalemia and mildly elevated creatinine of 1.04 with decreased GFR of 55  Troponin with delta negative Chest x-ray unremarkable CT angio chest aorta with and without contrast  showed no evidence of thoracic aortic aneurysm or dissection  and postoperative changes from previous hiatal hernia repair  I agree with the radiologist interpretation.  Cardiac monitoring: EKG obtained and interpreted by myself and attending physician which shows: NSR   Medications: I ordered medication including Tylenol.  I have reviewed the patients home medicines and have made adjustments as needed.  Critical Interventions:  ED Course:  Patient presents to the ED today complaining of mid back pain radiating to her chest, left arm, neck starting while she was moving logs this afternoon around 12 PM.  States that this was accompanied with blurry vision, vertigo, right lower extremity cramping.  Describes the pain as "extreme pressure" on her back. Previous medical history of hypothyroidism, anxiety, asthma, HTN, diverticulitis, upper GI bleed. States that she also felt short of breath and took a nitro which helped the pain a little.  States that she also was given 324 mg of ASA and repeat nitro when EMS picked her up.  Her pain has significantly decreased since that time.  PE versus aortic aneurysm.  Patient has had right leg previously worked up for DVT in 2020 without any notable findings, I do not suspect PE at this time as symptoms have remained similar to that time.  CT angio chest aorta was done and did not show any aortic dissection and no other acute abnormalities.  Upon reevaluation patient symptoms have improved with Tylenol and patient is ready to go home.  Patient vital signs remained stable for the course of her time here.  I do not suspect any emergent processes present at this time.  She also has a close follow-up with cardiology on February 4.  I believe that she is stable to make this appointment at this time.  Provided strict return ER precautions.  Patient expressed agreement understanding of plan.   Disposition: After consideration of the diagnostic results and the patients response to treatment, I feel that patient benefit from  discharge and treatment as above.   emergency department workup does not suggest an emergent condition requiring admission or immediate intervention beyond what has been performed at this time. The plan is: Follow-up with cardiology, return for any new or worsening symptoms,. The patient is safe for discharge and has been instructed  to return immediately for worsening symptoms, change in symptoms or any other concerns.    Final Clinical Impression(s) / ED Diagnoses Final diagnoses:  Acute thoracic back pain, unspecified back pain laterality  Chest pain, unspecified type    Rx / DC Orders ED Discharge Orders     None         Lavonia Drafts 11/13/23 2247    Benjiman Core, MD 11/13/23 2318

## 2023-11-13 NOTE — Progress Notes (Signed)
 Cardiology Office Note:    Date:  11/17/2023   ID:  [name], DOB 30-Apr-1946, MRN [id number]  PCP:  Vernon Velna SAUNDERS, MD   [facility] Providers Cardiologist:  Helene Bernstein, MD     Referring MD: Vernon Velna SAUNDERS, MD   Chief Complaint  Patient presents with   Chest Pain    History of Present Illness:    [name] is a 78 y.o. female is seen for follow up post cardia cath. She was seen in January for chest pain and family history of CAD. CT calcium  score completed 10/05/2023 revealed CAC score of 484. She has a history of HTN and HLD.  She reported an approximate 73-month history of increased fatigue, decreased exercise tolerance and chest discomfort relieved with rest.  Symptoms exacerbated by exertion, such as housework.  She was referred for cardiac cath which showed mild nonobstructive CAD. Normal LV function. Mildly elevated EDP.   This past Friday she had recurrent chest pain. She had been lifting a lot of wood.  It was in the center of her chest radiating to mid back. Took sl Ntg and this made her feel very strange and she had a severe HA. Went to ED. Troponin and Ecg normal. Minimally elevated D dimer. CT chest negative for PE or aortic disease.   Past Medical History:  Diagnosis Date   Anxiety    Colitis    Depression    Diverticulitis    Hyperlipidemia    Hypertension    Hypothyroidism    IBS (irritable bowel syndrome)    Malignant melanoma (HCC)    PVC's (premature ventricular contractions)     Past Surgical History:  Procedure Laterality Date   BLADDER REPAIR     CHOLECYSTECTOMY     ESOPHAGOGASTRODUODENOSCOPY (EGD) WITH PROPOFOL  N/A 07/24/2019   Procedure: ESOPHAGOGASTRODUODENOSCOPY (EGD) WITH PROPOFOL ;  Surgeon: Dianna Specking, MD;  Location: [facility] ENDOSCOPY;  Service: Endoscopy;  Laterality: N/A;   FOOT SURGERY Bilateral    HERNIA REPAIR     LEFT HEART CATH AND CORONARY ANGIOGRAPHY N/A 10/23/2023   Procedure: LEFT HEART CATH AND CORONARY  ANGIOGRAPHY;  Surgeon: Larine Fielding M, MD;  Location: [facility] INVASIVE CV LAB;  Service: Cardiovascular;  Laterality: N/A;   MELANOMA EXCISION     VAGINAL HYSTERECTOMY      Current Medications: Current Meds  Medication Sig   albuterol  (VENTOLIN  HFA) 108 (90 Base) MCG/ACT inhaler Inhale 2 puffs into the lungs every 6 (six) hours as needed (Asthma).   ALPRAZolam  (XANAX ) 1 MG tablet Take 1 mg by mouth 3 (three) times a week.   amLODipine  (NORVASC ) 5 MG tablet Take 5 mg by mouth in the morning.   aspirin  EC 81 MG tablet Take 1 tablet (81 mg total) by mouth daily. Swallow whole.   atorvastatin  (LIPITOR) 40 MG tablet Take 40 mg by mouth every Monday, Wednesday, and Friday.   Cholecalciferol (VITAMIN D3) 125 MCG (5000 UT) TABS Take 5,000 Units by mouth daily.   dicyclomine  (BENTYL ) 20 MG tablet Take 1 tablet (20 mg total) by mouth 2 (two) times daily.   escitalopram (LEXAPRO) 10 MG tablet Take 10 mg by mouth daily.   furosemide (LASIX) 20 MG tablet Take 20 mg by mouth daily as needed for fluid or edema.   hydrochlorothiazide  (HYDRODIURIL ) 25 MG tablet Take 25 mg by mouth daily.   levothyroxine  (SYNTHROID ) 50 MCG tablet Take 50 mcg by mouth daily before breakfast.    montelukast  (SINGULAIR ) 10 MG tablet  Take 10 mg by mouth daily.   nitroGLYCERIN  (NITROSTAT ) 0.4 MG SL tablet Place 1 tablet (0.4 mg total) under the tongue every 5 (five) minutes as needed for chest pain.   pantoprazole  (PROTONIX ) 40 MG tablet Take 1 tablet (40 mg total) by mouth daily.   polyethylene glycol (MIRALAX ) 17 g packet Take 17 g by mouth daily.   Polyvinyl Alcohol (LUBRICANT DROPS OP) Place 1 drop into both eyes in the morning, at noon, in the evening, and at bedtime.   Potassium Chloride  ER 20 MEQ TBCR Take 1 tablet by mouth daily as needed.   potassium chloride  SA (KLOR-CON  M) 20 MEQ tablet Take 1 tablet (20 mEq total) by mouth daily. Take along with hydrochlorothiazide    promethazine  (PHENERGAN ) 25 MG tablet Take 25 mg by  mouth 2 (two) times daily as needed for nausea or vomiting.   triamcinolone  cream (KENALOG ) 0.1 % Apply 1 Application topically 2 (two) times daily.   TYLENOL  500 MG tablet Take 500 mg by mouth every 6 (six) hours as needed for mild pain (pain score 1-3) or headache.   zolpidem  (AMBIEN ) 10 MG tablet Take 10 mg by mouth at bedtime.     Allergies:   Flagyl [metronidazole], Codeine, Penicillins, Erythromycin, Lisinopril, Tramadol , and Sulfa antibiotics   Social History   Socioeconomic History   Marital status: Married    Spouse name: [name]   Number of children: 2   Years of education: 12th   Highest education level: Not on file  Occupational History   Occupation: retired  Tobacco Use   Smoking status: Never   Smokeless tobacco: Never  Vaping Use   Vaping status: Never Used  Substance and Sexual Activity   Alcohol use: No   Drug use: No   Sexual activity: Yes    Partners: Male    Comment: married  Other Topics Concern   Not on file  Social History Narrative   Patient lives at home with her spouse.   Caffeine Use: 1-2 cups daily   Social Drivers of Health   Financial Resource Strain: Not on file  Food Insecurity: No Food Insecurity (10/21/2023)   Hunger Vital Sign    Worried About Running Out of Food in the Last Year: Never true    Ran Out of Food in the Last Year: Never true  Transportation Needs: No Transportation Needs (10/21/2023)   PRAPARE - Administrator, Civil Service (Medical): No    Lack of Transportation (Non-Medical): No  Physical Activity: Inactive (10/21/2023)   Exercise Vital Sign    Days of Exercise per Week: 0 days    Minutes of Exercise per Session: 0 min  Stress: Not on file  Social Connections: Not on file     Family History: The patient's family history includes Breast cancer in her paternal grandfather; CAD in her father; Cancer in her father; Coronary artery disease in her mother; Diabetes in her father; Heart attack in her father and  mother; Hypertension in her brother, father, mother, and sister; Lung cancer in her mother; Throat cancer in her mother.  ROS:   Please see the history of present illness.     All other systems reviewed and are negative.  EKGs/Labs/Other Studies Reviewed:    The following studies were reviewed today: Cardiac cath 10/23/23: LEFT HEART CATH AND CORONARY ANGIOGRAPHY   Conclusion      The left ventricular systolic function is normal.   LV end diastolic pressure is mildly elevated.  The left ventricular ejection fraction is 55-65% by visual estimate.   Mild nonobstructive CAD Normal LV function. Mildly elevated LVEDP 22 mm Hg        Recent Labs: 10/21/2023: ALT 23 11/13/2023: BUN 14; Creatinine, Ser 1.04; Hemoglobin 14.0; Platelets 226; Potassium 3.1; Sodium 140  Recent Lipid Panel    Component Value Date/Time   CHOL 151 10/21/2023 0916   TRIG 144 10/21/2023 0916   HDL 45 10/21/2023 0916   CHOLHDL 3.4 10/21/2023 0916   CHOLHDL 4.1 05/29/2014 0424   VLDL 39 05/29/2014 0424   LDLCALC 81 10/21/2023 0916     Risk Assessment/Calculations:                Physical Exam:    VS:  BP 133/71   Pulse (!) 57   Ht 5' 4 (1.626 m)   Wt 183 lb 3.2 oz (83.1 kg)   SpO2 95%   BMI 31.45 kg/m     Wt Readings from Last 3 Encounters:  11/17/23 183 lb 3.2 oz (83.1 kg)  11/13/23 182 lb (82.6 kg)  10/23/23 182 lb 8.7 oz (82.8 kg)     GEN:  Well nourished, well developed in no acute distress HEENT: Normal NECK: No JVD; No carotid bruits LYMPHATICS: No lymphadenopathy CARDIAC: RRR, no murmurs, rubs, gallops RESPIRATORY:  Clear to auscultation without rales, wheezing or rhonchi  ABDOMEN: Soft, non-tender, non-distended MUSCULOSKELETAL:  No edema; radial site has healed well.  SKIN: Warm and dry NEUROLOGIC:  Alert and oriented x 3 PSYCHIATRIC:  Normal affect   ASSESSMENT:    1. Chest pain of uncertain etiology   2. Coronary artery disease involving native coronary artery of  native heart with unstable angina pectoris (HCC)   3. Essential hypertension   4. Hyperlipidemia LDL goal <70    PLAN:    In order of problems listed above:  Chest pain. Cardiac cath results very reassuring. Negative CT chest. Recommend trial of protonix  and follow up with GI. Will follow up here in one year HLD on lipitor. Heart healthy diet. Regular aerobic exercise.  HTN well controlled on HCT and amlodipine .            Medication Adjustments/Labs and Tests Ordered: Current medicines are reviewed at length with the patient today.  Concerns regarding medicines are outlined above.  No orders of the defined types were placed in this encounter.  Meds ordered this encounter  Medications   pantoprazole  (PROTONIX ) 40 MG tablet    Sig: Take 1 tablet (40 mg total) by mouth daily.    Dispense:  30 tablet    Refill:  11    Patient Instructions  Medication Instructions:  Start Protonix  40 mg daily Continue all there medications *If you need a refill on your cardiac medications before your next appointment, please call your pharmacy*   Lab Work: None ordered   Testing/Procedures: None ordered   Follow-Up: At [facility], you and your health needs are our priority.  As part of our continuing mission to provide you with exceptional heart care, we have created designated Provider Care Teams.  These Care Teams include your primary Cardiologist (physician) and Advanced Practice Providers (APPs -  Physician Assistants and Nurse Practitioners) who all work together to provide you with the care you need, when you need it.  We recommend signing up for the patient portal called MyChart.  Sign up information is provided on this After Visit Summary.  MyChart is used to connect with patients for  Virtual Visits (Telemedicine).  Patients are able to view lab/test results, encounter notes, upcoming appointments, etc.  Non-urgent messages can be sent to your provider as well.   To  learn more about what you can do with MyChart, go to [email or web address].    Your next appointment:  1 year   Call in Oct to schedule Feb appointment     Provider:  Dr.Mackenzye Mackel          Signed, Wade Asebedo, MD  11/17/2023 8:57 AM    [facility]

## 2023-11-13 NOTE — ED Notes (Signed)
 Patient transported to X-ray

## 2023-11-13 NOTE — ED Notes (Signed)
 CCMD called.

## 2023-11-17 ENCOUNTER — Encounter: Payer: Self-pay | Admitting: Cardiology

## 2023-11-17 ENCOUNTER — Ambulatory Visit: Payer: PPO | Attending: Cardiology | Admitting: Cardiology

## 2023-11-17 VITALS — BP 133/71 | HR 57 | Ht 64.0 in | Wt 183.2 lb

## 2023-11-17 DIAGNOSIS — I25110 Atherosclerotic heart disease of native coronary artery with unstable angina pectoris: Secondary | ICD-10-CM

## 2023-11-17 DIAGNOSIS — I10 Essential (primary) hypertension: Secondary | ICD-10-CM

## 2023-11-17 DIAGNOSIS — E785 Hyperlipidemia, unspecified: Secondary | ICD-10-CM | POA: Diagnosis not present

## 2023-11-17 DIAGNOSIS — R079 Chest pain, unspecified: Secondary | ICD-10-CM | POA: Diagnosis not present

## 2023-11-17 MED ORDER — PANTOPRAZOLE SODIUM 40 MG PO TBEC: 40.0000 mg | DELAYED_RELEASE_TABLET | Freq: Every day | ORAL | 11 refills | Status: AC

## 2023-11-17 NOTE — Patient Instructions (Signed)
 Medication Instructions:  Start Protonix  40 mg daily Continue all there medications *If you need a refill on your cardiac medications before your next appointment, please call your pharmacy*   Lab Work: None ordered   Testing/Procedures: None ordered   Follow-Up: At [facility], you and your health needs are our priority.  As part of our continuing mission to provide you with exceptional heart care, we have created designated Provider Care Teams.  These Care Teams include your primary Cardiologist (physician) and Advanced Practice Providers (APPs -  Physician Assistants and Nurse Practitioners) who all work together to provide you with the care you need, when you need it.  We recommend signing up for the patient portal called MyChart.  Sign up information is provided on this After Visit Summary.  MyChart is used to connect with patients for Virtual Visits (Telemedicine).  Patients are able to view lab/test results, encounter notes, upcoming appointments, etc.  Non-urgent messages can be sent to your provider as well.   To learn more about what you can do with MyChart, go to [email or web address].    Your next appointment:  1 year   Call in Oct to schedule Feb appointment     Provider:  Dr.Jordan

## 2023-12-15 DIAGNOSIS — E039 Hypothyroidism, unspecified: Secondary | ICD-10-CM | POA: Diagnosis not present

## 2023-12-15 DIAGNOSIS — R931 Abnormal findings on diagnostic imaging of heart and coronary circulation: Secondary | ICD-10-CM | POA: Diagnosis not present

## 2023-12-15 DIAGNOSIS — F411 Generalized anxiety disorder: Secondary | ICD-10-CM | POA: Diagnosis not present

## 2023-12-15 DIAGNOSIS — G4700 Insomnia, unspecified: Secondary | ICD-10-CM | POA: Diagnosis not present

## 2023-12-15 DIAGNOSIS — I700 Atherosclerosis of aorta: Secondary | ICD-10-CM | POA: Diagnosis not present

## 2023-12-15 DIAGNOSIS — K219 Gastro-esophageal reflux disease without esophagitis: Secondary | ICD-10-CM | POA: Diagnosis not present

## 2023-12-15 DIAGNOSIS — R0789 Other chest pain: Secondary | ICD-10-CM | POA: Diagnosis not present

## 2023-12-15 DIAGNOSIS — K644 Residual hemorrhoidal skin tags: Secondary | ICD-10-CM | POA: Diagnosis not present

## 2023-12-30 ENCOUNTER — Encounter: Payer: Self-pay | Admitting: Gastroenterology

## 2023-12-30 ENCOUNTER — Ambulatory Visit: Payer: PPO | Admitting: Gastroenterology

## 2023-12-30 VITALS — BP 110/70 | HR 63 | Ht 65.0 in | Wt 186.0 lb

## 2023-12-30 DIAGNOSIS — K5909 Other constipation: Secondary | ICD-10-CM | POA: Diagnosis not present

## 2023-12-30 DIAGNOSIS — K641 Second degree hemorrhoids: Secondary | ICD-10-CM | POA: Diagnosis not present

## 2023-12-30 DIAGNOSIS — R1012 Left upper quadrant pain: Secondary | ICD-10-CM | POA: Diagnosis not present

## 2023-12-30 DIAGNOSIS — R1310 Dysphagia, unspecified: Secondary | ICD-10-CM

## 2023-12-30 NOTE — Patient Instructions (Addendum)
 You have been scheduled for an endoscopy. Please follow written instructions given to you at your visit today.  If you use inhalers (even only as needed), please bring them with you on the day of your procedure.  If you take any of the following medications, they will need to be adjusted prior to your procedure:   DO NOT TAKE 7 DAYS PRIOR TO TEST- Trulicity (dulaglutide) Ozempic, Wegovy (semaglutide) Mounjaro (tirzepatide) Bydureon Bcise (exanatide extended release)  DO NOT TAKE 1 DAY PRIOR TO YOUR TEST Rybelsus (semaglutide) Adlyxin (lixisenatide) Victoza (liraglutide) Byetta (exanatide) ___________________________________________________________________________   HEMORRHOID BANDING PROCEDURE    FOLLOW-UP CARE   The procedure you have had should have been relatively painless since the banding of the area involved does not have nerve endings and there is no pain sensation.  The rubber band cuts off the blood supply to the hemorrhoid and the band may fall off as soon as 48 hours after the banding (the band may occasionally be seen in the toilet bowl following a bowel movement). You may notice a temporary feeling of fullness in the rectum which should respond adequately to plain Tylenol or Motrin.  Following the banding, avoid strenuous exercise that evening and resume full activity the next day.  A sitz bath (soaking in a warm tub) or bidet is soothing, and can be useful for cleansing the area after bowel movements.     To avoid constipation, take two tablespoons of natural wheat bran, natural oat bran, flax, Benefiber or any over the counter fiber supplement and increase your water intake to 7-8 glasses daily.    Unless you have been prescribed anorectal medication, do not put anything inside your rectum for two weeks: No suppositories, enemas, fingers, etc.  Occasionally, you may have more bleeding than usual after the banding procedure.  This is often from the untreated  hemorrhoids rather than the treated one.  Don't be concerned if there is a tablespoon or so of blood.  If there is more blood than this, lie flat with your bottom higher than your head and apply an ice pack to the area. If the bleeding does not stop within a half an hour or if you feel faint, call our office at [phone] or go to the emergency room.  Problems are not common; however, if there is a substantial amount of bleeding, severe pain, chills, fever or difficulty passing urine (very rare) or other problems, you should call us at [phone] or report to the nearest emergency room.  Do not stay seated continuously for more than 2-3 hours for a day or two after the procedure.  Tighten your buttock muscles 10-15 times every two hours and take 10-15 deep breaths every 1-2 hours.  Do not spend more than a few minutes on the toilet if you cannot empty your bowel; instead re-visit the toilet at a later time.    Continue Miralax daily  Please purchase the following medications over the counter and take as directed: Voltaren gel - use topically    You have been scheduled for a 2nd banding appointment on Tuesday, 03-22-24 at 11:00 am. Please arrive 10 minutes early for registration. If you need to reschedule or cancel this appointment please call [phone] as soon as possible. Thank you.   Thank you for entrusting me with your care and for choosing [facility], Dr. Ileene Patrick

## 2023-12-30 NOTE — Progress Notes (Signed)
 HPI :  78 year old female here for a follow-up visit for hemorrhoid banding.  She also had questions about abdominal pain and some dysphagia she has been having.  Recall she has had some ongoing symptoms of hemorrhoids to include bleeding, irritation and prolapse.  Her colonoscopy is up-to-date per [facility] as outlined below.  At the last visit we had discussed potentially doing hemorrhoid banding to treat her symptoms.  Before doing that I had wanted her to see cardiology, she had a cardiac CT with elevated calcium score and had some exertional symptoms that are bothering her.  She had a cardiac catheterization with cardiology since of last seen her we did not find any high risk or significant stenosis and medical therapy was recommended.  She is hoping to proceed with hemorrhoid banding today after discussion of risks and benefits.  She has been maintained on MiraLAX and treating her constipation.  1 issue she is having has been abdominal discomfort in her left upper quadrant underneath the costal margin.  This been going on for a few months now.  She was given a trial of Protonix 40 mg daily and states it has not really helped much.  This tends to bother her when she bends over, is also tender to palpation.  She is not sure if eating makes it worse or not.  No nausea or vomiting.  She has been avoiding NSAIDs.  She has had some dysphagia that has been bothering her for some time now.  States it occurs to solids, feels in her lower chest and needs to drink fluid to help push food through at times.  She inquires about how to treat this and had it evaluated.  She had an EGD about 5 years ago which was reportedly okay.  She has had multiple imaging studies of her abdomen over the past few years as outlined below.  Most recently last year had a CT scan abdomen pelvis showing enteritis where she presented with infectious symptoms.  She denies again problems with diarrhea or problems with her bowels at  this time.  She is status post cholecystectomy    Prior workup: Colonoscopy 01/2023 - Dr. Marca Ancona - repeat in 3 years - no formal report on file     EGD 07/24/2019 - Dr. Bosie Clos - diminutive prepyloric ulcer - no path on file     CT abdomen / pelvis 03/23/2023 - IMPRESSION: 1. Moderate length segment of small bowel wall thickening with intraluminal fluid and mesenteric edema in the pelvis, consistent with enteritis, likely infectious or inflammatory. Given patient previously had colitis, the possibility of inflammatory bowel disease such as Crohn's disease is raised. 2. No hydronephrosis or obstructive uropathy. 3. Hepatic steatosis. 4. Small hiatal hernia. Mild distal colonic diverticulosis without diverticulitis.   Aortic Atherosclerosis (ICD10-I70.0).     CT abdomen / pelvis 10/28/22: IMPRESSION: 1. Mild decompression of the descending colon with minimal submucosal edema as findings may be due to a mild acute colitis of infectious or inflammatory nature. 2. Mild diverticulosis of the sigmoid colon without active inflammation. 3. Mild hepatic steatosis without focal mass. 4. Small sliding hiatal hernia with surgical clips adjacent the hiatal hernia/gastroesophageal junction. 5. Aortic atherosclerosis.   Aortic Atherosclerosis (ICD10-I70.0).     CTA 10/28/22: IMPRESSION: VASCULAR   1. No evidence of active gastrointestinal bleeding. 2.  Aortic Atherosclerosis (ICD10-I70.0).   NON-VASCULAR   1. Mural thickening and pericolonic fat stranding involving the descending and proximal sigmoid colon, consistent with inflammatory or  infectious colitis. 2. Rectosigmoid diverticulosis without diverticulitis. 3. Hepatic steatosis. 4. Small hiatal hernia.   Suspected to have ischemic colitis at that time     CT enterography 07/25/2019: IMPRESSION: 1. Acute diverticulitis of the small bowel involving a right pelvic loop of ileum. Previously noted tiny foci of free air in  the adjacent mesentery on recent 07/22/2019 CT study have resolved. No discrete abscess. Inflammatory changes otherwise not substantially changed in the interval. 2. Numerous tiny diverticula throughout the mid to distal ileum. Mild sigmoid diverticulosis. No colonic diverticulitis. 3. Small hiatal hernia 4.  Aortic Atherosclerosis (ICD10-I70.0).    Colonoscopy 01/14/23 - Dr. Marca Ancona - good prep. 2 x 4-4mm rectal polyps removed. 2 ascending polyps 5-56mm in size, 3mm transverse polyp. Diverticulosis. Internal hemorrhoids. Rectal polyps hyperplastic, the transverse / ascending polyps were adenomatous   Past Medical History:  Diagnosis Date   Anxiety    Colitis    Depression    Diverticulitis    Hyperlipidemia    Hypertension    Hypothyroidism    IBS (irritable bowel syndrome)    Malignant melanoma (HCC)    PVC's (premature ventricular contractions)      Past Surgical History:  Procedure Laterality Date   BLADDER REPAIR     CHOLECYSTECTOMY     ESOPHAGOGASTRODUODENOSCOPY (EGD) WITH PROPOFOL N/A 07/24/2019   Procedure: ESOPHAGOGASTRODUODENOSCOPY (EGD) WITH PROPOFOL;  Surgeon: Charlott Rakes, MD;  Location: [facility];  Service: Endoscopy;  Laterality: N/A;   FOOT SURGERY Bilateral    HERNIA REPAIR     LEFT HEART CATH AND CORONARY ANGIOGRAPHY N/A 10/23/2023   Procedure: LEFT HEART CATH AND CORONARY ANGIOGRAPHY;  Surgeon: Swazil[place], Peter M, MD;  Location: [facility] INVASIVE CV LAB;  Service: Cardiovascular;  Laterality: N/A;   MELANOMA EXCISION     VAGINAL HYSTERECTOMY     Family History  Problem Relation Age of Onset   Heart attack Mother        stroke and cancer   Hypertension Mother    Throat cancer Mother    Coronary artery disease Mother    Lung cancer Mother    Cancer Father    Diabetes Father    Hypertension Father    CAD Father    Heart attack Father        heart attack and died at 84   Hypertension Sister    Hypertension Brother    Breast cancer Paternal  Grandfather    Social History   Tobacco Use   Smoking status: Never   Smokeless tobacco: Never  Vaping Use   Vaping status: Never Used  Substance Use Topics   Alcohol use: No   Drug use: No   Current Outpatient Medications  Medication Sig Dispense Refill   albuterol (VENTOLIN HFA) 108 (90 Base) MCG/ACT inhaler Inhale 2 puffs into the lungs every 6 (six) hours as needed (Asthma).     ALPRAZolam (XANAX) 1 MG tablet Take 1 mg by mouth 3 (three) times a week.     amLODipine (NORVASC) 5 MG tablet Take 5 mg by mouth in the morning.     aspirin EC 81 MG tablet Take 1 tablet (81 mg total) by mouth daily. Swallow whole.     atorvastatin (LIPITOR) 40 MG tablet Take 40 mg by mouth every Monday, Wednesday, and Friday.     Cholecalciferol (VITAMIN D3) 125 MCG (5000 UT) TABS Take 5,000 Units by mouth daily.     dicyclomine (BENTYL) 20 MG tablet Take 1 tablet (20 mg total) by mouth 2 (  two) times daily. 20 tablet 0   escitalopram (LEXAPRO) 10 MG tablet Take 10 mg by mouth daily.     furosemide (LASIX) 20 MG tablet Take 20 mg by mouth daily as needed for fluid or edema.     hydrochlorothiazide (HYDRODIURIL) 25 MG tablet Take 25 mg by mouth daily.     levothyroxine (SYNTHROID) 50 MCG tablet Take 50 mcg by mouth daily before breakfast.      montelukast (SINGULAIR) 10 MG tablet Take 10 mg by mouth daily.     nitroGLYCERIN (NITROSTAT) 0.4 MG SL tablet Place 1 tablet (0.4 mg total) under the tongue every 5 (five) minutes as needed for chest pain. 25 tablet 3   pantoprazole (PROTONIX) 40 MG tablet Take 1 tablet (40 mg total) by mouth daily. 30 tablet 11   polyethylene glycol (MIRALAX) 17 g packet Take 17 g by mouth daily. 14 each 0   Polyvinyl Alcohol (LUBRICANT DROPS OP) Place 1 drop into both eyes in the morning, at noon, in the evening, and at bedtime.     Potassium Chloride ER 20 MEQ TBCR Take 1 tablet by mouth daily as needed.     potassium chloride SA (KLOR-CON M) 20 MEQ tablet Take 1 tablet (20 mEq  total) by mouth daily. Take along with hydrochlorothiazide 30 tablet 2   promethazine (PHENERGAN) 25 MG tablet Take 25 mg by mouth 2 (two) times daily as needed for nausea or vomiting.     triamcinolone cream (KENALOG) 0.1 % Apply 1 Application topically 2 (two) times daily. 80 g 0   TYLENOL 500 MG tablet Take 500 mg by mouth every 6 (six) hours as needed for mild pain (pain score 1-3) or headache.     zolpidem (AMBIEN) 10 MG tablet Take 10 mg by mouth at bedtime.     No current facility-administered medications for this visit.   Allergies  Allergen Reactions   Flagyl [Metronidazole] Hives and Other (See Comments)    Hives and welts   Codeine Nausea And Vomiting   Penicillins Swelling and Other (See Comments)    Pt states she had some hand swelling from pcn ~ 50 yrs ago when she was pregnant    Erythromycin Diarrhea and Other (See Comments)    Irritability, diarrhea, yeast infection.    Lisinopril Hives   Tramadol Other (See Comments)    Dizziness   Sulfa Antibiotics Nausea And Vomiting     Review of Systems: All systems reviewed and negative except where noted in HPI.   Lab Results  Component Value Date   WBC 10.7 (H) 11/13/2023   HGB 14.0 11/13/2023   HCT 40.2 11/13/2023   MCV 88.2 11/13/2023   PLT 226 11/13/2023    Lab Results  Component Value Date   NA 140 11/13/2023   CL 101 11/13/2023   K 3.1 (L) 11/13/2023   CO2 24 11/13/2023   BUN 14 11/13/2023   CREATININE 1.04 (H) 11/13/2023   GFRNONAA 55 (L) 11/13/2023   CALCIUM 9.3 11/13/2023   PHOS 2.9 07/25/2019   ALBUMIN 4.4 10/21/2023   GLUCOSE 102 (H) 11/13/2023     Physical Exam: BP 110/70   Pulse 63   Ht 5\' 5"  (1.651 m)   Wt 186 lb (84.4 kg)   BMI 30.95 kg/m  Constitutional: Pleasant,well-developed, female in no acute distress. Abdominal: Soft, nondistended, LUQ TTP with positive carnett.  There are no masses palpable. No hepatomegaly.  ASSESSMENT: 78 y.o. female here for assessment of the  following  1. Grade II hemorrhoids   2. Chronic constipation   3. Dysphagia, unspecified type   4. LUQ pain    Constipated being treated with MiraLAX successfully and doing better in this regard.  We discussed options for hemorrhoid therapy which continues to bother her periodically.  We discussed surgery versus hemorrhoid banding, discussion of risks and benefits she wanted to proceed with hemorrhoid banding.  Left lateral hemorrhoid banded as outlined below.  She has been having intermittent left upper quadrant pain that localizes under the costal margin, focally tenderness to palpation and has positive Carnett's sign there.  I think she has abdominal wall pain or costochondritis.  Recommend Voltaren gel applied few times daily to see if that will help her.  In light of her dysphagia I am recommending an upper endoscopy to further evaluate and dilate if appropriate.  I discussed risks and benefits of EGD and anesthesia and she wants to proceed with that.  This will also clear her upper tract in relation to her pain.  If her symptoms persist despite conservative measures and EGD negative, can consider repeat CT scan but reassured her of her exam findings in the office today, suspect more abdominal wall pain.  She agrees, further recommendations pending her course and results of upper endoscopy   PLAN: - banded LL hemorrhoid today - continue Miralax daily, avoid constipation - schedule [facility] for dysphagia at the [facility] - Voltaren gel to her LUQ / costal rib - consider CT if it persists   Harlin Rain, MD [facility]    PROCEDURE NOTE: The patient presents with symptomatic grade II  hemorrhoids, requesting rubber band ligation of his/her hemorrhoidal disease.  All risks, benefits and alternative forms of therapy were described and informed consent was obtained.   The anorectum was pre-medicated with 0.125% nitroglycerin ointment The decision was made to band the LL internal  hemorrhoid, and the [facility] System was used to perform band ligation without complication.  Digital anorectal examination was then performed to assure proper positioning of the band, and to adjust the banded tissue as required.  The patient was discharged home without pain or other issues.  Dietary and behavioral recommendations were given and along with follow-up instructions.     The following adjunctive treatments were recommended: Continue miralax  The patient will return in the upcoming weeks for  follow-up and possible additional banding as required. No complications were encountered and the patient tolerated the procedure well.

## 2024-01-01 DIAGNOSIS — K319 Disease of stomach and duodenum, unspecified: Secondary | ICD-10-CM | POA: Diagnosis not present

## 2024-01-01 DIAGNOSIS — K317 Polyp of stomach and duodenum: Secondary | ICD-10-CM | POA: Diagnosis not present

## 2024-01-04 ENCOUNTER — Encounter: Payer: Self-pay | Admitting: Gastroenterology

## 2024-01-04 ENCOUNTER — Ambulatory Visit: Admitting: Gastroenterology

## 2024-01-04 VITALS — BP 143/76 | HR 57 | Temp 97.9°F | Resp 17 | Ht 64.0 in | Wt 180.0 lb

## 2024-01-04 DIAGNOSIS — K449 Diaphragmatic hernia without obstruction or gangrene: Secondary | ICD-10-CM

## 2024-01-04 DIAGNOSIS — K317 Polyp of stomach and duodenum: Secondary | ICD-10-CM

## 2024-01-04 DIAGNOSIS — R1310 Dysphagia, unspecified: Secondary | ICD-10-CM

## 2024-01-04 DIAGNOSIS — K209 Esophagitis, unspecified without bleeding: Secondary | ICD-10-CM

## 2024-01-04 DIAGNOSIS — Q399 Congenital malformation of esophagus, unspecified: Secondary | ICD-10-CM

## 2024-01-04 DIAGNOSIS — K3189 Other diseases of stomach and duodenum: Secondary | ICD-10-CM

## 2024-01-04 DIAGNOSIS — K222 Esophageal obstruction: Secondary | ICD-10-CM | POA: Diagnosis not present

## 2024-01-04 DIAGNOSIS — K319 Disease of stomach and duodenum, unspecified: Secondary | ICD-10-CM | POA: Diagnosis not present

## 2024-01-04 DIAGNOSIS — R1012 Left upper quadrant pain: Secondary | ICD-10-CM

## 2024-01-04 DIAGNOSIS — K2289 Other specified disease of esophagus: Secondary | ICD-10-CM

## 2024-01-04 MED ORDER — SODIUM CHLORIDE 0.9 % IV SOLN: 500.0000 mL | INTRAVENOUS | Status: DC

## 2024-01-04 NOTE — Progress Notes (Signed)
   Established Patient Office Visit  Subjective   Patient ID: BANEZA BARTOSZEK, female    DOB: 09/10/1946  Age: 78 y.o. MRN: [id number]  Chief Complaint  Patient presents with   Endoscopy    dysphagia    HPI    ROS    Objective:     BP 127/67   Pulse 63   Temp 97.9 F (36.6 C)   Resp 17   Ht 5\' 4"  (1.626 m)   Wt 180 lb (81.6 kg)   SpO2 96%   BMI 30.90 kg/m    Physical Exam   No results found for any visits on 01/04/24.    The 10-year ASCVD risk score (Arnett DK, et al., 2019) is: 24.8%    Assessment & Plan:   Problem List Items Addressed This Visit   None Visit Diagnoses       Dysphagia, unspecified type    -  Primary   Relevant Medications   0.9 %  sodium chloride infusion   Other Relevant Orders   Diet NPO   Hypoglycemia/Hyperglycemia protocol   Emergency Medications   ECG and pulse monitoring    Monitor NIBP   Notify physician   Monitor O2 SATs   NPO status   Vital signs   Monitor NIBP, ECG and PSA02   Notify physician   Discharge home with responsible adult.   Discharge instructions   Esophagogastroduodenoscopy: verify informed consent     LUQ pain           No follow-ups on file.    Estella Husk, RN

## 2024-01-04 NOTE — Progress Notes (Signed)
 Called to room to assist during endoscopic procedure.  Patient ID and intended procedure confirmed with present staff. Received instructions for my participation in the procedure from the performing physician.

## 2024-01-04 NOTE — Progress Notes (Signed)
 History and Physical Interval Note: Seen on 12/30/23 in the office - see that note for details - EGD to be done for dysphagia and LUQ pain. No interval changes. She wishes to proceed.   01/04/2024 8:38 AM  [name]  has presented today for endoscopic procedure(s), with the diagnosis of  Encounter Diagnoses  Name Primary?   Dysphagia, unspecified type Yes   LUQ pain   .  The various methods of evaluation and treatment have been discussed with the patient and/or family. After consideration of risks, benefits and other options for treatment, the patient has consented to  the endoscopic procedure(s).   The patient's history has been reviewed, patient examined, no change in status, stable for surgery.  I have reviewed the patient's chart and labs.  Questions were answered to the patient's satisfaction.    Harlin Rain, MD [facility]

## 2024-01-04 NOTE — Progress Notes (Signed)
 Pt's states no medical or surgical changes since previsit or office visit.

## 2024-01-04 NOTE — Patient Instructions (Addendum)
 Resume previous diet and medications.  Education attached on gastric polyps.  Handouts provided on hiatal hernia and esophageal stricture.    YOU HAD AN ENDOSCOPIC PROCEDURE TODAY AT THE [facility]:   Refer to the procedure report that was given to you for any specific questions about what was found during the examination.  If the procedure report does not answer your questions, please call your gastroenterologist to clarify.  If you requested that your care partner not be given the details of your procedure findings, then the procedure report has been included in a sealed envelope for you to review at your convenience later.  YOU SHOULD EXPECT: Some feelings of bloating in the abdomen. Passage of more gas than usual.  Walking can help get rid of the air that was put into your GI tract during the procedure and reduce the bloating. If you had a lower endoscopy (such as a colonoscopy or flexible sigmoidoscopy) you may notice spotting of blood in your stool or on the toilet paper. If you underwent a bowel prep for your procedure, you may not have a normal bowel movement for a few days.  Please Note:  You might notice some irritation and congestion in your nose or some drainage.  This is from the oxygen used during your procedure.  There is no need for concern and it should clear up in a day or so.  SYMPTOMS TO REPORT IMMEDIATELY:  Following upper endoscopy (EGD)  Vomiting of blood or coffee ground material  New chest pain or pain under the shoulder blades  Painful or persistently difficult swallowing  New shortness of breath  Fever of 100F or higher  Black, tarry-looking stools  For urgent or emergent issues, a gastroenterologist can be reached at any hour by calling [phone]. Do not use MyChart messaging for urgent concerns.    DIET:  We do recommend a small meal at first, but then you may proceed to your regular diet.  Drink plenty of fluids but you should avoid alcoholic  beverages for 24 hours.  ACTIVITY:  You should plan to take it easy for the rest of today and you should NOT DRIVE or use heavy machinery until tomorrow (because of the sedation medicines used during the test).    FOLLOW UP: Our staff will call the number listed on your records the next business day following your procedure.  We will call around 7:15- 8:00 am to check on you and address any questions or concerns that you may have regarding the information given to you following your procedure. If we do not reach you, we will leave a message.     If any biopsies were taken you will be contacted by phone or by letter within the next 1-3 weeks.  Please call us at [phone] if you have not heard about the biopsies in 3 weeks.    SIGNATURES/CONFIDENTIALITY: You and/or your care partner have signed paperwork which will be entered into your electronic medical record.  These signatures attest to the fact that that the information above on your After Visit Summary has been reviewed and is understood.  Full responsibility of the confidentiality of this discharge information lies with you and/or your care-partner.

## 2024-01-04 NOTE — Progress Notes (Signed)
 Pt resting comfortably. VSS. Airway intact. SBAR complete to RN. All questions answered.

## 2024-01-04 NOTE — Progress Notes (Deleted)
 Called to room to assist during endoscopic procedure.  Patient ID and intended procedure confirmed with present staff. Received instructions for my participation in the procedure from the performing physician.

## 2024-01-04 NOTE — Op Note (Signed)
 [facility] Patient Name: [name] Procedure Date: 01/04/2024 8:36 AM MRN: [id number] Endoscopist: Viviann Spare P. Adela Lank , MD, [id number] Age: 78 Referring MD:  Date of Birth: [birth year] Gender: Female Account #: [id number] Procedure:                Upper GI endoscopy Indications:              Abdominal pain in the left upper quadrant -                            possibly musculoskeletal but EGD to clear upper                            tract, Dysphagia - feels it in lower chest, worst                            with breads Medicines:                Monitored Anesthesia Care Procedure:                Pre-Anesthesia Assessment:                           - Prior to the procedure, a History and Physical                            was performed, and patient medications and                            allergies were reviewed. The patient's tolerance of                            previous anesthesia was also reviewed. The risks                            and benefits of the procedure and the sedation                            options and risks were discussed with the patient.                            All questions were answered, and informed consent                            was obtained. Prior Anticoagulants: The patient has                            taken no anticoagulant or antiplatelet agents. ASA                            Grade Assessment: II - A patient with mild systemic                            disease. After reviewing the risks and benefits,  the patient was deemed in satisfactory condition to                            undergo the procedure.                           After obtaining informed consent, the endoscope was                            passed under direct vision. Throughout the                            procedure, the patient's blood pressure, pulse, and                            oxygen saturations were monitored  continuously. The                            GIF [id number] #[id number] was introduced through the                            mouth, and advanced to the second part of duodenum.                            The upper GI endoscopy was accomplished without                            difficulty. The patient tolerated the procedure                            well. Scope In: Scope Out: Findings:                 Esophagogastric landmarks were identified: the                            Z-line was found at 37 cm, the gastroesophageal                            junction was found at 37 cm and the upper extent of                            the gastric folds was found at 37 cm from the                            incisors.                           One suspected very subtle benign-appearing,                            intrinsic mild stenosis was found 37 cm from the                            incisors. A TTS dilator was  passed through the                            scope. Dilation with an 18-19-20 mm balloon dilator                            was performed to 18 mm, 19 mm and 20 mm. No                            dilation effect noted there despite dilation to 20mm                           The examined esophagus was rather tortuous.                           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                           The exam of the esophagus was otherwise normal.                           Biopsies were obtained from the proximal and distal                            esophagus with cold forceps for histology to rule                            out eosinophilic esophagitis.                           Multiple small sessile polyps were found in the                            gastric fundus and in the gastric body. Suspect                            benign fundic gland polyps. Representative biopsies                            were taken with  a cold forceps for histology, rule                            out adenoma.                           A small paraesophageal hiatal hernia was present on                            retroflexed views.  The exam of the stomach was otherwise normal.                           Biopsies were taken with a cold forceps for                            Helicobacter pylori testing given the patient's                            abdominal pain.                           The examined duodenum was normal. Complications:            No immediate complications. Estimated blood loss:                            Minimal. Estimated Blood Loss:     Estimated blood loss was minimal. Impression:               - Esophagogastric landmarks identified.                           - Suspected subtle benign-appearing esophageal                            stenosis. Dilated to 20mm as above.                           - Tortuous esophagus.                           - Ectopic gastric mucosa in the upper third of the                            esophagus.                           - Normal esophagus otherwise - biopsies taken to                            rule out EoE.                           - Multiple gastric polyps. Suspect benign fundic                            gland polyps. Biopsied.                           - Paraesophageal hiatal hernia.                           - Normal stomach otherwise - biopsies taken to rule                            out H pylori                           -  Normal examined duodenum.                           No concerning pathology noted in regards to                            abdominal pain, which may be musculoskeletal. Recommendation:           - Patient has a contact number available for                            emergencies. The signs and symptoms of potential                            delayed complications were discussed with the                             patient. Return to normal activities tomorrow.                            Written discharge instructions were provided to the                            patient.                           - Resume previous diet.                           - Continue present medications.                           - Await pathology results and course post dilation.                            If symptoms of dysphagia persist consider barium                            study and or manometry to assess motility Viviann Spare P. Meshilem Machuca, MD 01/04/2024 9:07:33 AM This report has been signed electronically.

## 2024-01-05 ENCOUNTER — Telehealth: Payer: Self-pay

## 2024-01-05 NOTE — Telephone Encounter (Signed)
 Follow up call to pt, lm for pt to call if having any difficulty with normal activities or eating and drinking.  Also to call if any other questions or concerns.

## 2024-01-07 ENCOUNTER — Encounter: Payer: Self-pay | Admitting: Gastroenterology

## 2024-01-07 LAB — SURGICAL PATHOLOGY

## 2024-01-14 DIAGNOSIS — B079 Viral wart, unspecified: Secondary | ICD-10-CM | POA: Diagnosis not present

## 2024-01-14 DIAGNOSIS — D485 Neoplasm of uncertain behavior of skin: Secondary | ICD-10-CM | POA: Diagnosis not present

## 2024-02-11 ENCOUNTER — Encounter: Admitting: Gastroenterology

## 2024-03-16 DIAGNOSIS — G4700 Insomnia, unspecified: Secondary | ICD-10-CM | POA: Diagnosis not present

## 2024-03-16 DIAGNOSIS — E039 Hypothyroidism, unspecified: Secondary | ICD-10-CM | POA: Diagnosis not present

## 2024-03-16 DIAGNOSIS — K219 Gastro-esophageal reflux disease without esophagitis: Secondary | ICD-10-CM | POA: Diagnosis not present

## 2024-03-16 DIAGNOSIS — R0789 Other chest pain: Secondary | ICD-10-CM | POA: Diagnosis not present

## 2024-03-16 DIAGNOSIS — Z0000 Encounter for general adult medical examination without abnormal findings: Secondary | ICD-10-CM | POA: Diagnosis not present

## 2024-03-16 DIAGNOSIS — I10 Essential (primary) hypertension: Secondary | ICD-10-CM | POA: Diagnosis not present

## 2024-03-16 DIAGNOSIS — E7800 Pure hypercholesterolemia, unspecified: Secondary | ICD-10-CM | POA: Diagnosis not present

## 2024-03-16 DIAGNOSIS — F411 Generalized anxiety disorder: Secondary | ICD-10-CM | POA: Diagnosis not present

## 2024-03-16 DIAGNOSIS — J4520 Mild intermittent asthma, uncomplicated: Secondary | ICD-10-CM | POA: Diagnosis not present

## 2024-03-16 DIAGNOSIS — R931 Abnormal findings on diagnostic imaging of heart and coronary circulation: Secondary | ICD-10-CM | POA: Diagnosis not present

## 2024-03-16 DIAGNOSIS — I700 Atherosclerosis of aorta: Secondary | ICD-10-CM | POA: Diagnosis not present

## 2024-03-16 DIAGNOSIS — K644 Residual hemorrhoidal skin tags: Secondary | ICD-10-CM | POA: Diagnosis not present

## 2024-03-17 ENCOUNTER — Other Ambulatory Visit (HOSPITAL_BASED_OUTPATIENT_CLINIC_OR_DEPARTMENT_OTHER): Payer: Self-pay | Admitting: Internal Medicine

## 2024-03-17 DIAGNOSIS — E2839 Other primary ovarian failure: Secondary | ICD-10-CM

## 2024-03-22 ENCOUNTER — Encounter: Payer: Self-pay | Admitting: Gastroenterology

## 2024-03-22 ENCOUNTER — Ambulatory Visit: Admitting: Gastroenterology

## 2024-03-22 VITALS — BP 132/74 | HR 85 | Ht 64.0 in | Wt 183.0 lb

## 2024-03-22 DIAGNOSIS — K219 Gastro-esophageal reflux disease without esophagitis: Secondary | ICD-10-CM

## 2024-03-22 DIAGNOSIS — K449 Diaphragmatic hernia without obstruction or gangrene: Secondary | ICD-10-CM | POA: Diagnosis not present

## 2024-03-22 DIAGNOSIS — K641 Second degree hemorrhoids: Secondary | ICD-10-CM

## 2024-03-22 MED ORDER — VOQUEZNA 10 MG PO TABS: 10.0000 mg | ORAL_TABLET | Freq: Every day | ORAL | Status: DC

## 2024-03-22 NOTE — Progress Notes (Addendum)
 HPI :  78 year old female here for a follow-up visit for hemorrhoid banding, also to discuss her recent EGD.  Recall she has had persistent symptoms of hemorrhoids to include bleeding, irritation and prolapse.  Colonoscopy with [facility] fairly recently as outlined below.  We banded the left lateral hemorrhoid on March 19.  She tolerated it well but she is not sure how much benefit she has had from it yet.  She has a history of constipation and that has been well-controlled on MiraLAX , she denies straining.  She wishes to continue with another trial of hemorrhoid banding today after discussion of options  Recall she has had some chronic reflux as well as dysphagia.  We performed an EGD for her since the last visit, she had a very mild GEJ stricture that was dilated to 20 mm with a good result.  She states this has seemingly resolved most of her dysphagia and is not really having that symptom anymore.  However she was noted to have a very small paraesophageal hernia.  She states she has had a hiatal hernia repaired more than 20 years ago with a good response at the time.  She questions if she needs it repaired again.  She did not have any Barrett's or esophagitis however she does have ongoing pyrosis and reflux symptoms that bother her fairly frequently despite Protonix  40 mg daily.  We discussed options.     Prior workup: Colonoscopy 01/2023 - Dr. Feliberto Hopping - repeat in 3 years - no formal report on file     EGD 07/24/2019 - Dr. Honey Lusty - diminutive prepyloric ulcer - no path on file     CT abdomen / pelvis 03/23/2023 - IMPRESSION: 1. Moderate length segment of small bowel wall thickening with intraluminal fluid and mesenteric edema in the pelvis, consistent with enteritis, likely infectious or inflammatory. Given patient previously had colitis, the possibility of inflammatory bowel disease such as Crohn's disease is raised. 2. No hydronephrosis or obstructive uropathy. 3. Hepatic  steatosis. 4. Small hiatal hernia. Mild distal colonic diverticulosis without diverticulitis.   Aortic Atherosclerosis (ICD10-I70.0).     CT abdomen / pelvis 10/28/22: IMPRESSION: 1. Mild decompression of the descending colon with minimal submucosal edema as findings may be due to a mild acute colitis of infectious or inflammatory nature. 2. Mild diverticulosis of the sigmoid colon without active inflammation. 3. Mild hepatic steatosis without focal mass. 4. Small sliding hiatal hernia with surgical clips adjacent the hiatal hernia/gastroesophageal junction. 5. Aortic atherosclerosis.   Aortic Atherosclerosis (ICD10-I70.0).     CTA 10/28/22: IMPRESSION: VASCULAR   1. No evidence of active gastrointestinal bleeding. 2.  Aortic Atherosclerosis (ICD10-I70.0).   NON-VASCULAR   1. Mural thickening and pericolonic fat stranding involving the descending and proximal sigmoid colon, consistent with inflammatory or infectious colitis. 2. Rectosigmoid diverticulosis without diverticulitis. 3. Hepatic steatosis. 4. Small hiatal hernia.   Suspected to have ischemic colitis at that time     CT enterography 07/25/2019: IMPRESSION: 1. Acute diverticulitis of the small bowel involving a right pelvic loop of ileum. Previously noted tiny foci of free air in the adjacent mesentery on recent 07/22/2019 CT study have resolved. No discrete abscess. Inflammatory changes otherwise not substantially changed in the interval. 2. Numerous tiny diverticula throughout the mid to distal ileum. Mild sigmoid diverticulosis. No colonic diverticulitis. 3. Small hiatal hernia 4.  Aortic Atherosclerosis (ICD10-I70.0).       Colonoscopy 01/14/23 - Dr. Feliberto Hopping - good prep. 2 x 4-22mm rectal  polyps removed. 2 ascending polyps 5-54mm in size, 3mm transverse polyp. Diverticulosis. Internal hemorrhoids. Rectal polyps hyperplastic, the transverse / ascending polyps were adenomatous   EGD 01/04/24: -  Esophagogastric landmarks were identified: the Z-line was found at 37 cm, the gastroesophageal junction was found at 37 cm and the upper extent of the gastric folds was found at 37 cm from the incisors. Findings: - One suspected very subtle benign-appearing, intrinsic mild stenosis was found 37 cm from the incisors. A TTS dilator was passed through the scope. Dilation with an 18-19-20 mm balloon dilator was performed to 18 mm, 19 mm and 20 mm. No dilation effect noted there despite dilation to 20mm - The examined esophagus was rather tortuous. 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 - The exam of the esophagus was otherwise normal. - Biopsies were obtained from the proximal and distal esophagus with cold forceps for histology to rule out eosinophilic esophagitis. - Multiple small sessile polyps were found in the gastric fundus and in the gastric body. Suspect benign fundic gland polyps. Representative biopsies were taken with a cold forceps for histology, rule out adenoma. - A small paraesophageal hiatal hernia was present on retroflexed views. - The exam of the stomach was otherwise normal. - Biopsies were taken with a cold forceps for Helicobacter pylori testing given the patient's abdominal pain. - The examined duodenum was normal.   - Await pathology results and course post dilation. If symptoms of dysphagia persist consider barium study and or manometry to assess motility   FINAL DIAGNOSIS        1. Surgical [P], gastric polyps :       - FUNDIC GLAND POLYPS        2. Surgical [P], gastric antrum and gastric body :       - REACTIVE GASTROPATHY       - NEGATIVE FOR H. PYLORI ON H&E STAIN       - NEGATIVE FOR INTESTINAL METAPLASIA OR MALIGNANCY        3. Surgical [P], esophagus :       - ACUTE NONSPECIFIC ESOPHAGITIS.SEE NOTE.       - NEGATIVE FOR INCREASED INTRAEPITHELIAL EOSINOPHILS       - NEGATIVE FOR DYSPLASIA OR MALIGNANCY      Past Medical  History:  Diagnosis Date   Anxiety    Colitis    Depression    Diverticulitis    Hyperlipidemia    Hypertension    Hypothyroidism    IBS (irritable bowel syndrome)    Malignant melanoma (HCC)    PVC's (premature ventricular contractions)      Past Surgical History:  Procedure Laterality Date   BLADDER REPAIR     CHOLECYSTECTOMY     ESOPHAGOGASTRODUODENOSCOPY (EGD) WITH PROPOFOL  N/A 07/24/2019   Procedure: ESOPHAGOGASTRODUODENOSCOPY (EGD) WITH PROPOFOL ;  Surgeon: Baldo Bonds, MD;  Location: [facility];  Service: Endoscopy;  Laterality: N/A;   FOOT SURGERY Bilateral    HERNIA REPAIR     LEFT HEART CATH AND CORONARY ANGIOGRAPHY N/A 10/23/2023   Procedure: LEFT HEART CATH AND CORONARY ANGIOGRAPHY;  Surgeon: [place], Peter M, MD;  Location: [facility] INVASIVE CV LAB;  Service: Cardiovascular;  Laterality: N/A;   MELANOMA EXCISION     uvula sleep apnea surgery 20 yrs ago Bilateral    VAGINAL HYSTERECTOMY     Family History  Problem Relation Age of Onset   Heart attack Mother  stroke and cancer   Hypertension Mother    Throat cancer Mother    Coronary artery disease Mother    Lung cancer Mother    Cancer Father    Diabetes Father    Hypertension Father    CAD Father    Heart attack Father        heart attack and died at 66   Hypertension Sister    Hypertension Brother    Breast cancer Paternal Grandfather    Social History   Tobacco Use   Smoking status: Never   Smokeless tobacco: Never  Vaping Use   Vaping status: Never Used  Substance Use Topics   Alcohol use: No   Drug use: No   Current Outpatient Medications  Medication Sig Dispense Refill   albuterol  (VENTOLIN  HFA) 108 (90 Base) MCG/ACT inhaler Inhale 2 puffs into the lungs every 6 (six) hours as needed (Asthma).     ALPRAZolam  (XANAX ) 1 MG tablet Take 1 mg by mouth 3 (three) times a week.     amLODipine  (NORVASC ) 5 MG tablet Take 5 mg by mouth in the morning.     atorvastatin  (LIPITOR) 40 MG tablet  Take 40 mg by mouth every Monday, Wednesday, and Friday.     Cholecalciferol (VITAMIN D3) 125 MCG (5000 UT) TABS Take 5,000 Units by mouth daily.     cyclobenzaprine  (FLEXERIL ) 5 MG tablet 1 tablet Orally Three times a day as needed for spasm     dicyclomine  (BENTYL ) 20 MG tablet Take 1 tablet (20 mg total) by mouth 2 (two) times daily. 20 tablet 0   escitalopram (LEXAPRO) 10 MG tablet Take 10 mg by mouth daily.     furosemide (LASIX) 20 MG tablet Take 20 mg by mouth daily as needed for fluid or edema.     hydrochlorothiazide  (HYDRODIURIL ) 25 MG tablet Take 25 mg by mouth daily.     levothyroxine  (SYNTHROID ) 50 MCG tablet Take 50 mcg by mouth daily before breakfast.      meloxicam  (MOBIC ) 15 MG tablet 1 tablet Orally Once a day as needed for pain for 30 days     montelukast  (SINGULAIR ) 10 MG tablet Take 10 mg by mouth daily.     pantoprazole  (PROTONIX ) 40 MG tablet Take 1 tablet (40 mg total) by mouth daily. 30 tablet 11   polyethylene glycol (MIRALAX ) 17 g packet Take 17 g by mouth daily. 14 each 0   Polyvinyl Alcohol (LUBRICANT DROPS OP) Place 1 drop into both eyes in the morning, at noon, in the evening, and at bedtime.     Potassium Chloride  ER 20 MEQ TBCR Take 1 tablet by mouth daily as needed.     promethazine  (PHENERGAN ) 25 MG tablet Take 25 mg by mouth 2 (two) times daily as needed for nausea or vomiting.     triamcinolone  cream (KENALOG ) 0.1 % Apply 1 Application topically 2 (two) times daily. 80 g 0   TYLENOL  500 MG tablet Take 500 mg by mouth every 6 (six) hours as needed for mild pain (pain score 1-3) or headache.     Vonoprazan Fumarate (VOQUEZNA) 10 MG TABS Take 10 mg by mouth daily.     zolpidem  (AMBIEN ) 10 MG tablet Take 10 mg by mouth at bedtime.     No current facility-administered medications for this visit.   Allergies  Allergen Reactions   Flagyl [Metronidazole] Hives and Other (See Comments)    Hives and welts   Codeine Nausea And Vomiting   Erythromycin  Diarrhea and  Other (See Comments)    Irritability, diarrhea, yeast infection.    Lisinopril Hives   Penicillins Swelling and Other (See Comments)    Pt states she had some hand swelling from pcn ~ 50 yrs ago when she was pregnant    Tramadol  Other (See Comments)    Dizziness   Sulfa Antibiotics Nausea And Vomiting     Review of Systems: All systems reviewed and negative except where noted in HPI.   Lab Results  Component Value Date   WBC 10.7 (H) 11/13/2023   HGB 14.0 11/13/2023   HCT 40.2 11/13/2023   MCV 88.2 11/13/2023   PLT 226 11/13/2023    Lab Results  Component Value Date   NA 140 11/13/2023   CL 101 11/13/2023   K 3.1 (L) 11/13/2023   CO2 24 11/13/2023   BUN 14 11/13/2023   CREATININE 1.04 (H) 11/13/2023   GFRNONAA 55 (L) 11/13/2023   CALCIUM  9.3 11/13/2023   PHOS 2.9 07/25/2019   ALBUMIN 4.4 10/21/2023   GLUCOSE 102 (H) 11/13/2023     Physical Exam: BP 132/74   Pulse 85   Ht 5\' 4"  (1.626 m)   Wt 183 lb (83 kg)   BMI 31.41 kg/m  Constitutional: Pleasant,well-developed, female in no acute distress. Neurological: Alert and oriented to person place and time. Psychiatric: Normal mood and affect. Behavior is normal.   ASSESSMENT: 78 y.o. female here for assessment of the following  1. Gastroesophageal reflux disease, unspecified whether esophagitis present   2. Hiatal hernia   3. Grade II hemorrhoids    Recent EGD without esophagitis or Barrett's.  She had a very small paraesophageal hiatal hernia first postop change from prior hernia repair.  Her dysphagia was resolved following dilation however she still has reflux symptoms that bother her.  We discussed options.  I would not recommend hiatal hernia repair especially given her history of surgery there in the past and her age, discussed that as a last resort if she fails medical therapy.  Will give her a trial of Voquenza 10 mg daily for a few weeks, samples given, will see how she does with this.  If she finds it  beneficial she will contact us  and we will see if we can get a prescription.  If she does not have benefit likewise she will let me know.  Will also get a barium swallow to assess motility/function etc given some of her persistent symptoms, she is agreeable with that.  Otherwise reviewed hemorrhoid options, she did not have great benefit yet after the first banding but wishes to proceed with her second banding today, would avoid surgery for this if at all possible and I think reasonable to do another banding.  RP hemorrhoid banded as outlined below.  Continue MiraLAX  to treat constipation   PLAN: - trial of Voquezna 10mg  / day, prescription if this works better for her - barium swallow - RP hemorrhoid banded - follow up for consideration for third banding pending her course, in upcoming weeks.  Christi Coward, MD [facility]     PROCEDURE NOTE: The patient presents with symptomatic grade II  hemorrhoids, requesting rubber band ligation of his/her hemorrhoidal disease.  All risks, benefits and alternative forms of therapy were described and informed consent was obtained.   The anorectum was pre-medicated with 0.125% nitroglycerin  ointment The decision was made to band the RP internal hemorrhoid, and the [facility] System was used to perform band ligation without complication.  Digital anorectal examination was then performed to assure proper positioning of the band, and to adjust the banded tissue as required.  The patient was discharged home without pain or other issues.  Dietary and behavioral recommendations were given and along with follow-up instructions.     The following adjunctive treatments were recommended: Continue Miralax   The patient will return in 2-4 weeks for  follow-up and possible additional banding as required. No complications were encountered and the patient tolerated the procedure well.

## 2024-03-22 NOTE — Patient Instructions (Addendum)
 HEMORRHOID BANDING PROCEDURE    FOLLOW-UP CARE   The procedure you have had should have been relatively painless since the banding of the area involved does not have nerve endings and there is no pain sensation.  The rubber band cuts off the blood supply to the hemorrhoid and the band may fall off as soon as 48 hours after the banding (the band may occasionally be seen in the toilet bowl following a bowel movement). You may notice a temporary feeling of fullness in the rectum which should respond adequately to plain Tylenol  or Motrin .  Following the banding, avoid strenuous exercise that evening and resume full activity the next day.  A sitz bath (soaking in a warm tub) or bidet is soothing, and can be useful for cleansing the area after bowel movements.     To avoid constipation, take two tablespoons of natural wheat bran, natural oat bran, flax, Benefiber or any over the counter fiber supplement and increase your water  intake to 7-8 glasses daily.    Unless you have been prescribed anorectal medication, do not put anything inside your rectum for two weeks: No suppositories, enemas, fingers, etc.  Occasionally, you may have more bleeding than usual after the banding procedure.  This is often from the untreated hemorrhoids rather than the treated one.  Don't be concerned if there is a tablespoon or so of blood.  If there is more blood than this, lie flat with your bottom higher than your head and apply an ice pack to the area. If the bleeding does not stop within a half an hour or if you feel faint, call our office at [phone] or go to the emergency room.  Problems are not common; however, if there is a substantial amount of bleeding, severe pain, chills, fever or difficulty passing urine (very rare) or other problems, you should call us  at [phone] or report to the nearest emergency room.  Do not stay seated continuously for more than 2-3 hours for a day or two after the procedure.   Tighten your buttock muscles 10-15 times every two hours and take 10-15 deep breaths every 1-2 hours.  Do not spend more than a few minutes on the toilet if you cannot empty your bowel; instead re-visit the toilet at a later time.   ___________________________________________________________________________  We have given you samples of the following medication to take:  Voquezna 10 mg: Take once daily  You will be contacted by [facility] Scheduling in the next 2 days to arrange a Barium Swallow.  The number on your caller ID will be [phone], please answer when they call.  If you have not heard from them in 2 days please call [phone] to schedule.     You have been scheduled for a Barium Esophogram at [facility] Radiology (1st floor of the hospital) on ___________ at _____________. Please arrive 30 minutes prior to your appointment for registration. Make certain not to have anything to eat or drink 3 hours prior to your test. If you need to reschedule for any reason, please contact radiology at [phone] to do so. __________________________________________________________________ A barium swallow is an examination that concentrates on views of the esophagus. This tends to be a double contrast exam (barium and two liquids which, when combined, create a gas to distend the wall of the oesophagus) or single contrast (non-ionic iodine based). The study is usually tailored to your symptoms so a good history is essential. Attention is paid during the  study to the form, structure and configuration of the esophagus, looking for functional disorders (such as aspiration, dysphagia, achalasia, motility and reflux) EXAMINATION You may be asked to change into a gown, depending on the type of swallow being performed. A radiologist and radiographer will perform the procedure. The radiologist will advise you of the type of contrast selected for your procedure and direct you during the exam. You  will be asked to stand, sit or lie in several different positions and to hold a small amount of fluid in your mouth before being asked to swallow while the imaging is performed .In some instances you may be asked to swallow barium coated marshmallows to assess the motility of a solid food bolus. The exam can be recorded as a digital or video fluoroscopy procedure. POST PROCEDURE It will take 1-2 days for the barium to pass through your system. To facilitate this, it is important, unless otherwise directed, to increase your fluids for the next 24-48hrs and to resume your normal diet.  This test typically takes about 30 minutes to perform. __________________________________________________________________________________  [name] have been scheduled for your 3rd banding appointment on Thursday, 06-02-24 at 3:40 pm. Please arrive 10 minutes early for registration. If you need to reschedule or cancel this appointment please call [phone] as soon as possible.   Thank you for entrusting me with your care and for choosing [facility], Dr. Alvester Johnson    If your blood pressure at your visit was 140/90 or greater, please contact your primary care physician to follow up on this. ______________________________________________________  If you are age 23 or older, your body mass index should be between 23-30. Your Body mass index is 31.41 kg/m. If this is out of the aforementioned range listed, please consider follow up with your Primary Care Provider.  If you are age 68 or younger, your body mass index should be between 19-25. Your Body mass index is 31.41 kg/m. If this is out of the aformentioned range listed, please consider follow up with your Primary Care Provider.  ________________________________________________________  The [facility] providers would like to encourage you to use MYCHART to communicate with providers for non-urgent requests or questions.  Due to long hold times on the  telephone, sending your provider a message by [facility] may be a faster and more efficient way to get a response.  Please allow 48 business hours for a response.  Please remember that this is for non-urgent requests.  _______________________________________________________  Due to recent changes in healthcare laws, you may see the results of your imaging and laboratory studies on MyChart before your provider has had a chance to review them.  We understand that in some cases there may be results that are confusing or concerning to you. Not all laboratory results come back in the same time frame and the provider may be waiting for multiple results in order to interpret others.  Please give us  48 hours in order for your provider to thoroughly review all the results before contacting the office for clarification of your results.

## 2024-03-25 DIAGNOSIS — I10 Essential (primary) hypertension: Secondary | ICD-10-CM | POA: Diagnosis not present

## 2024-03-29 ENCOUNTER — Telehealth: Payer: Self-pay | Admitting: Gastroenterology

## 2024-03-29 NOTE — Telephone Encounter (Signed)
 Patient is returning a call back to [name]. Patient is requesting a call back. Please advise.
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
## 2024-03-29 NOTE — Telephone Encounter (Signed)
 Called and Left message for patient to call back to discuss Voquezna .  Patient was instructed to discontinue medication immediately since she is experiencing side effects.

## 2024-03-30 DIAGNOSIS — H04123 Dry eye syndrome of bilateral lacrimal glands: Secondary | ICD-10-CM | POA: Diagnosis not present

## 2024-03-30 DIAGNOSIS — Z961 Presence of intraocular lens: Secondary | ICD-10-CM | POA: Diagnosis not present

## 2024-03-30 DIAGNOSIS — H2589 Other age-related cataract: Secondary | ICD-10-CM | POA: Diagnosis not present

## 2024-03-30 DIAGNOSIS — H43813 Vitreous degeneration, bilateral: Secondary | ICD-10-CM | POA: Diagnosis not present

## 2024-03-30 DIAGNOSIS — H40013 Open angle with borderline findings, low risk, bilateral: Secondary | ICD-10-CM | POA: Diagnosis not present

## 2024-03-30 DIAGNOSIS — H26492 Other secondary cataract, left eye: Secondary | ICD-10-CM | POA: Diagnosis not present

## 2024-03-30 DIAGNOSIS — H532 Diplopia: Secondary | ICD-10-CM | POA: Diagnosis not present

## 2024-04-01 ENCOUNTER — Encounter (HOSPITAL_BASED_OUTPATIENT_CLINIC_OR_DEPARTMENT_OTHER): Payer: Self-pay

## 2024-04-01 ENCOUNTER — Other Ambulatory Visit: Payer: Self-pay

## 2024-04-01 ENCOUNTER — Emergency Department (HOSPITAL_BASED_OUTPATIENT_CLINIC_OR_DEPARTMENT_OTHER)

## 2024-04-01 ENCOUNTER — Emergency Department (HOSPITAL_BASED_OUTPATIENT_CLINIC_OR_DEPARTMENT_OTHER): Admitting: Radiology

## 2024-04-01 ENCOUNTER — Emergency Department (HOSPITAL_BASED_OUTPATIENT_CLINIC_OR_DEPARTMENT_OTHER)
Admission: EM | Admit: 2024-04-01 | Discharge: 2024-04-01 | Disposition: A | Discharge status: Home / Self Care | Attending: Emergency Medicine | Admitting: Emergency Medicine

## 2024-04-01 DIAGNOSIS — Z79899 Other long term (current) drug therapy: Secondary | ICD-10-CM | POA: Diagnosis not present

## 2024-04-01 DIAGNOSIS — M4312 Spondylolisthesis, cervical region: Secondary | ICD-10-CM | POA: Diagnosis not present

## 2024-04-01 DIAGNOSIS — W19XXXA Unspecified fall, initial encounter: Secondary | ICD-10-CM

## 2024-04-01 DIAGNOSIS — Z8673 Personal history of transient ischemic attack (TIA), and cerebral infarction without residual deficits: Secondary | ICD-10-CM | POA: Diagnosis not present

## 2024-04-01 DIAGNOSIS — W010XXA Fall on same level from slipping, tripping and stumbling without subsequent striking against object, initial encounter: Secondary | ICD-10-CM | POA: Diagnosis not present

## 2024-04-01 DIAGNOSIS — S0083XA Contusion of other part of head, initial encounter: Secondary | ICD-10-CM | POA: Insufficient documentation

## 2024-04-01 DIAGNOSIS — M4802 Spinal stenosis, cervical region: Secondary | ICD-10-CM | POA: Diagnosis not present

## 2024-04-01 DIAGNOSIS — R930 Abnormal findings on diagnostic imaging of skull and head, not elsewhere classified: Secondary | ICD-10-CM | POA: Insufficient documentation

## 2024-04-01 DIAGNOSIS — I10 Essential (primary) hypertension: Secondary | ICD-10-CM | POA: Diagnosis not present

## 2024-04-01 DIAGNOSIS — M25531 Pain in right wrist: Secondary | ICD-10-CM | POA: Insufficient documentation

## 2024-04-01 DIAGNOSIS — S0990XA Unspecified injury of head, initial encounter: Secondary | ICD-10-CM | POA: Diagnosis not present

## 2024-04-01 DIAGNOSIS — I6782 Cerebral ischemia: Secondary | ICD-10-CM | POA: Diagnosis not present

## 2024-04-01 DIAGNOSIS — M47812 Spondylosis without myelopathy or radiculopathy, cervical region: Secondary | ICD-10-CM | POA: Diagnosis not present

## 2024-04-01 DIAGNOSIS — R29818 Other symptoms and signs involving the nervous system: Secondary | ICD-10-CM | POA: Diagnosis not present

## 2024-04-01 DIAGNOSIS — S199XXA Unspecified injury of neck, initial encounter: Secondary | ICD-10-CM | POA: Diagnosis not present

## 2024-04-01 DIAGNOSIS — M19031 Primary osteoarthritis, right wrist: Secondary | ICD-10-CM | POA: Diagnosis not present

## 2024-04-01 DIAGNOSIS — R9402 Abnormal brain scan: Secondary | ICD-10-CM | POA: Diagnosis not present

## 2024-04-01 DIAGNOSIS — Z043 Encounter for examination and observation following other accident: Secondary | ICD-10-CM | POA: Diagnosis not present

## 2024-04-01 LAB — URINALYSIS, W/ REFLEX TO CULTURE (INFECTION SUSPECTED)
Bacteria, UA: NONE SEEN
Bilirubin Urine: NEGATIVE
Glucose, UA: NEGATIVE mg/dL
Hgb urine dipstick: NEGATIVE
Ketones, ur: NEGATIVE mg/dL
Leukocytes,Ua: NEGATIVE
Nitrite: NEGATIVE
Protein, ur: NEGATIVE mg/dL
Specific Gravity, Urine: 1.005 (ref 1.005–1.030)
pH: 6 (ref 5.0–8.0)

## 2024-04-01 LAB — CBC WITH DIFFERENTIAL/PLATELET
Abs Immature Granulocytes: 0.08 10*3/uL — ABNORMAL HIGH (ref 0.00–0.07)
Basophils Absolute: 0.1 10*3/uL (ref 0.0–0.1)
Basophils Relative: 1 %
Eosinophils Absolute: 0.5 10*3/uL (ref 0.0–0.5)
Eosinophils Relative: 5 %
HCT: 38.5 % (ref 36.0–46.0)
Hemoglobin: 13.2 g/dL (ref 12.0–15.0)
Immature Granulocytes: 1 %
Lymphocytes Relative: 27 %
Lymphs Abs: 2.6 10*3/uL (ref 0.7–4.0)
MCH: 31.2 pg (ref 26.0–34.0)
MCHC: 34.3 g/dL (ref 30.0–36.0)
MCV: 91 fL (ref 80.0–100.0)
Monocytes Absolute: 0.8 10*3/uL (ref 0.1–1.0)
Monocytes Relative: 9 %
Neutro Abs: 5.5 10*3/uL (ref 1.7–7.7)
Neutrophils Relative %: 57 %
Platelets: 221 10*3/uL (ref 150–400)
RBC: 4.23 MIL/uL (ref 3.87–5.11)
RDW: 13 % (ref 11.5–15.5)
WBC: 9.7 10*3/uL (ref 4.0–10.5)
nRBC: 0 % (ref 0.0–0.2)

## 2024-04-01 LAB — COMPREHENSIVE METABOLIC PANEL WITH GFR
ALT: 24 U/L (ref 0–44)
AST: 28 U/L (ref 15–41)
Albumin: 3.8 g/dL (ref 3.5–5.0)
Alkaline Phosphatase: 102 U/L (ref 38–126)
Anion gap: 11 (ref 5–15)
BUN: 21 mg/dL (ref 8–23)
CO2: 27 mmol/L (ref 22–32)
Calcium: 9.2 mg/dL (ref 8.9–10.3)
Chloride: 103 mmol/L (ref 98–111)
Creatinine, Ser: 0.79 mg/dL (ref 0.44–1.00)
GFR, Estimated: >60 mL/min (ref >60)
Glucose, Bld: 95 mg/dL (ref 70–99)
Potassium: 3.8 mmol/L (ref 3.5–5.1)
Sodium: 142 mmol/L (ref 135–145)
Total Bilirubin: 0.6 mg/dL (ref 0.0–1.2)
Total Protein: 6.5 g/dL (ref 6.5–8.1)

## 2024-04-01 LAB — TSH: TSH: 2.67 u[IU]/mL (ref 0.350–4.500)

## 2024-04-01 MED ORDER — FENTANYL CITRATE PF 50 MCG/ML IJ SOSY
50.0000 ug | PREFILLED_SYRINGE | Freq: Once | INTRAMUSCULAR | Status: AC
  Administered 2024-04-01: 50 ug via INTRAVENOUS
  Filled 2024-04-01: qty 1

## 2024-04-01 MED ORDER — KETOROLAC TROMETHAMINE 30 MG/ML IJ SOLN
30.0000 mg | Freq: Once | INTRAMUSCULAR | Status: AC
  Administered 2024-04-01: 30 mg via INTRAMUSCULAR
  Filled 2024-04-01: qty 1

## 2024-04-01 MED ORDER — PROMETHAZINE HCL 25 MG PO TABS
25.0000 mg | ORAL_TABLET | Freq: Once | ORAL | Status: AC
  Administered 2024-04-01: 25 mg via ORAL
  Filled 2024-04-01: qty 1

## 2024-04-01 NOTE — ED Provider Notes (Signed)
 3:17 PM Care assumed from Dr. Gordon Latus.  At time of transfer of care, patient is waiting for results of imaging to look for traumatic injuries after fall in the garden today.  Anticipate likely postconcussive syndrome primarily but will rule out acute bony injuries or bleed.  Anticipate discharge if symptoms improve and workup is reassuring.  5:14 PM I went to tell the patient and family that her CT scans did not show significant traumatic injury with no skull fracture, neck fracture, or wrist fracture.  When I was telling her this they were extracting some concerned about the more prevalent diplopia she has been having and the unsteadiness for the last few days.  She clarified that about a month ago she had an episode of diplopia in the setting of some migraine headache but then it resolved.  She then says that for the last 10 days or so she is been having off and on double vision that has been quite persistent.  She also reports having unsteadiness with her gait and her husband is concerned that this is worsening.  Her CT did show evidence of some chronic ischemic changes.  She has no formal history of stroke they say.  We had a shared decision-making conversation offering either outpatient neurology workup versus getting an MRI and labs today to further evaluate given the on and off double vision and now more unsteadiness and they would like to get more workup tonight.  From a trauma standpoint her workup was reassuring but we will get more workup to look for acute stroke.  Will get screening labs and MRI of the brain.  Anticipate discussion with neurology if any concerning findings are discovered.  6:27 PM MRI shows no acute stroke and her labs are reassuring.  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  Will discharge for outpatient  follow-up.    Clinical Impression: 1. Fall, initial encounter   2. Contusion of forehead, initial encounter   3. Right wrist pain   4. Abnormal MRI of head   5. Abnormal CT of the head     Disposition: Discharge  Condition: Good  I have discussed the results, Dx and Tx plan with the pt(& family if present). He/she/they expressed understanding and agree(s) with the plan. Discharge instructions discussed at great length. Strict return precautions discussed and pt &/or family have verbalized understanding of the instructions. No further questions at time of discharge.    New Prescriptions   No medications on file    Follow Up: Pahwani, Regino Caprio, MD [address] [place] [address] [phone]     [facility] [address]     [address] [place]  [address] [phone]    [facility] [address] [place]  [address] [phone] Schedule an appointment as soon as possible for a visit       Travaris Kosh, Marine Sia, MD 04/01/24 [phone]

## 2024-04-01 NOTE — ED Notes (Signed)
 Patient transported to MRI

## 2024-04-01 NOTE — Discharge Instructions (Addendum)
 Your history, exam, and workup today did not show significant traumatic injuries on the CT imaging and x-ray performed earlier.  After we have discussion, you are describing over a week of double vision waxing and waning and we agreed together to get MRI to rule out acute stroke.  It does not appear you have an acute stroke but it does appear there were some altered stroke she may have had in the past.  With this finding, we recommend outpatient neurology follow-up so please call them.  Please also follow-up with your primary doctor.  Please rest and stay hydrated.  If any symptoms change or worsen acutely, please return to the nearest

## 2024-04-01 NOTE — ED Triage Notes (Signed)
 Pt c/o mechanical fall just PTA, c/o R side face/ head pain, arm pain mostly in my elbow, end of my upper arm. Denies thinners, LOC, +ROM, CNS intact distal to injury
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
## 2024-04-05 DIAGNOSIS — S0990XD Unspecified injury of head, subsequent encounter: Secondary | ICD-10-CM | POA: Diagnosis not present

## 2024-04-05 DIAGNOSIS — R58 Hemorrhage, not elsewhere classified: Secondary | ICD-10-CM | POA: Diagnosis not present

## 2024-04-05 DIAGNOSIS — I6381 Other cerebral infarction due to occlusion or stenosis of small artery: Secondary | ICD-10-CM | POA: Diagnosis not present

## 2024-04-05 DIAGNOSIS — H532 Diplopia: Secondary | ICD-10-CM | POA: Diagnosis not present

## 2024-04-05 DIAGNOSIS — I10 Essential (primary) hypertension: Secondary | ICD-10-CM | POA: Diagnosis not present

## 2024-04-05 DIAGNOSIS — Z9181 History of falling: Secondary | ICD-10-CM | POA: Diagnosis not present

## 2024-04-11 DIAGNOSIS — E039 Hypothyroidism, unspecified: Secondary | ICD-10-CM | POA: Diagnosis not present

## 2024-04-11 DIAGNOSIS — I10 Essential (primary) hypertension: Secondary | ICD-10-CM | POA: Diagnosis not present

## 2024-04-11 DIAGNOSIS — E782 Mixed hyperlipidemia: Secondary | ICD-10-CM | POA: Diagnosis not present

## 2024-04-11 DIAGNOSIS — F411 Generalized anxiety disorder: Secondary | ICD-10-CM | POA: Diagnosis not present

## 2024-04-11 DIAGNOSIS — F32A Depression, unspecified: Secondary | ICD-10-CM | POA: Diagnosis not present

## 2024-04-12 ENCOUNTER — Ambulatory Visit: Payer: Self-pay | Admitting: Gastroenterology

## 2024-04-12 ENCOUNTER — Ambulatory Visit (HOSPITAL_COMMUNITY)
Admission: RE | Admit: 2024-04-12 | Discharge: 2024-04-12 | Disposition: A | Discharge status: Home / Self Care | Source: Ambulatory Visit | Attending: Gastroenterology | Admitting: Gastroenterology

## 2024-04-12 DIAGNOSIS — K219 Gastro-esophageal reflux disease without esophagitis: Secondary | ICD-10-CM | POA: Insufficient documentation

## 2024-04-12 DIAGNOSIS — K449 Diaphragmatic hernia without obstruction or gangrene: Secondary | ICD-10-CM | POA: Diagnosis not present

## 2024-04-12 DIAGNOSIS — K641 Second degree hemorrhoids: Secondary | ICD-10-CM | POA: Diagnosis not present

## 2024-04-12 DIAGNOSIS — K224 Dyskinesia of esophagus: Secondary | ICD-10-CM | POA: Diagnosis not present

## 2024-04-12 DIAGNOSIS — R1310 Dysphagia, unspecified: Secondary | ICD-10-CM | POA: Diagnosis not present

## 2024-05-12 DIAGNOSIS — E782 Mixed hyperlipidemia: Secondary | ICD-10-CM | POA: Diagnosis not present

## 2024-05-12 DIAGNOSIS — E039 Hypothyroidism, unspecified: Secondary | ICD-10-CM | POA: Diagnosis not present

## 2024-05-12 DIAGNOSIS — F411 Generalized anxiety disorder: Secondary | ICD-10-CM | POA: Diagnosis not present

## 2024-05-12 DIAGNOSIS — F32A Depression, unspecified: Secondary | ICD-10-CM | POA: Diagnosis not present

## 2024-05-12 DIAGNOSIS — I10 Essential (primary) hypertension: Secondary | ICD-10-CM | POA: Diagnosis not present

## 2024-05-14 DIAGNOSIS — I10 Essential (primary) hypertension: Secondary | ICD-10-CM | POA: Diagnosis not present

## 2024-06-02 ENCOUNTER — Encounter: Payer: Self-pay | Admitting: Gastroenterology

## 2024-06-02 ENCOUNTER — Ambulatory Visit (INDEPENDENT_AMBULATORY_CARE_PROVIDER_SITE_OTHER): Admitting: Gastroenterology

## 2024-06-02 VITALS — BP 140/70 | HR 64 | Ht 63.5 in | Wt 188.1 lb

## 2024-06-02 DIAGNOSIS — K602 Anal fissure, unspecified: Secondary | ICD-10-CM

## 2024-06-02 DIAGNOSIS — K641 Second degree hemorrhoids: Secondary | ICD-10-CM

## 2024-06-02 DIAGNOSIS — K625 Hemorrhage of anus and rectum: Secondary | ICD-10-CM | POA: Diagnosis not present

## 2024-06-02 MED ORDER — AMBULATORY NON FORMULARY MEDICATION
0 refills | Status: AC
Start: 2024-06-02 — End: ?

## 2024-06-02 NOTE — Progress Notes (Signed)
 HPI :  78 year old female here for a follow-up visit for hemorrhoid banding.   Recall she has had persistent symptoms of hemorrhoids to include bleeding, irritation and prolapse.  Colonoscopy with [facility] last year. She has had symptoms of bleeding that has persisted.  We banded the left lateral hemorrhoid in March and RP hemorrhoid in June. Unfortunately she does not think she has had any benefit from it yet. She continues to have bleeding and drips blood into her underwear at times, wonders when the banding will take effect. She denies much of any rectal pain. She had constipation previously, has been well controlled on Miralax  and she denies straining.    Prior workup: EGD 07/24/2019 - Dr. Dianna - diminutive prepyloric ulcer - no path on file       Colonoscopy 01/14/23 - Dr. Saintclair - good prep. 2 x 4-42mm rectal polyps removed. 2 ascending polyps 5-34mm in size, 3mm transverse polyp. Diverticulosis. Internal hemorrhoids. Rectal polyps hyperplastic, the transverse / ascending polyps were adenomatous     EGD 01/04/24: - Esophagogastric landmarks were identified: the Z-line was found at 37 cm, the gastroesophageal junction was found at 37 cm and the upper extent of the gastric folds was found at 37 cm from the incisors. Findings: - One suspected very subtle benign-appearing, intrinsic mild stenosis was found 37 cm from the incisors. A TTS dilator was passed through the scope. Dilation with an 18-19-20 mm balloon dilator was performed to 18 mm, 19 mm and 20 mm. No dilation effect noted there despite dilation to 20mm - The examined esophagus was rather tortuous. 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 - The exam of the esophagus was otherwise normal. - Biopsies were obtained from the proximal and distal esophagus with cold forceps for histology to rule out eosinophilic esophagitis. - Multiple small sessile polyps were found in the gastric fundus and in the  gastric body. Suspect benign fundic gland polyps. Representative biopsies were taken with a cold forceps for histology, rule out adenoma. - A small paraesophageal hiatal hernia was present on retroflexed views. - The exam of the stomach was otherwise normal. - Biopsies were taken with a cold forceps for Helicobacter pylori testing given the patient's abdominal pain. - The examined duodenum was normal.     - Await pathology results and course post dilation. If symptoms of dysphagia persist consider barium study and or manometry to assess motility     FINAL DIAGNOSIS        1. Surgical [P], gastric polyps :       - FUNDIC GLAND POLYPS        2. Surgical [P], gastric antrum and gastric body :       - REACTIVE GASTROPATHY       - NEGATIVE FOR H. PYLORI ON H&E STAIN       - NEGATIVE FOR INTESTINAL METAPLASIA OR MALIGNANCY        3. Surgical [P], esophagus :       - ACUTE NONSPECIFIC ESOPHAGITIS.SEE NOTE.       - NEGATIVE FOR INCREASED INTRAEPITHELIAL EOSINOPHILS       - NEGATIVE FOR DYSPLASIA OR MALIGNANCY      Barium swallow 04/12/24: IMPRESSION: 1. A swallowed 13 mm barium tablet was delayed at the level of the distal esophagus/GE junction. The tablet ultimately passed into the stomach after 2.5 to 3 minutes with the patient taking additional swallows of water  and liquid barium  contrast. A stricture at this site cannot be excluded. Consider endoscopy for further evaluation. 2. 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 3. Small to moderate-sized size hiatal hernia.   Past Medical History:  Diagnosis Date   Anxiety    Colitis    Depression    Diverticulitis    Hyperlipidemia    Hypertension    Hypothyroidism    IBS (irritable bowel syndrome)    Malignant melanoma (HCC)    PVC's (premature ventricular contractions)      Past Surgical History:  Procedure Laterality Date   BLADDER REPAIR      CHOLECYSTECTOMY     ESOPHAGOGASTRODUODENOSCOPY (EGD) WITH PROPOFOL  N/A 07/24/2019   Procedure: ESOPHAGOGASTRODUODENOSCOPY (EGD) WITH PROPOFOL ;  Surgeon: Dianna Specking, MD;  Location: [facility];  Service: Endoscopy;  Laterality: N/A;   FOOT SURGERY Bilateral    HERNIA REPAIR     LEFT HEART CATH AND CORONARY ANGIOGRAPHY N/A 10/23/2023   Procedure: LEFT HEART CATH AND CORONARY ANGIOGRAPHY;  Surgeon: Swazil[place], Peter M, MD;  Location: [facility] INVASIVE CV LAB;  Service: Cardiovascular;  Laterality: N/A;   MELANOMA EXCISION     uvula sleep apnea surgery 20 yrs ago Bilateral    VAGINAL HYSTERECTOMY     Family History  Problem Relation Age of Onset   Heart attack Mother        stroke and cancer   Hypertension Mother    Throat cancer Mother    Coronary artery disease Mother    Lung cancer Mother    Cancer Father    Diabetes Father    Hypertension Father    CAD Father    Heart attack Father        heart attack and died at 72   Hypertension Sister    Hypertension Brother    Breast cancer Paternal Grandfather    Social History   Tobacco Use   Smoking status: Never   Smokeless tobacco: Never  Vaping Use   Vaping status: Never Used  Substance Use Topics   Alcohol use: No   Drug use: No   Current Outpatient Medications  Medication Sig Dispense Refill   albuterol  (VENTOLIN  HFA) 108 (90 Base) MCG/ACT inhaler Inhale 2 puffs into the lungs every 6 (six) hours as needed (Asthma).     ALPRAZolam  (XANAX ) 1 MG tablet Take 1 mg by mouth 3 (three) times a week.     amLODipine  (NORVASC ) 5 MG tablet Take 5 mg by mouth in the morning.     atorvastatin  (LIPITOR) 40 MG tablet Take 40 mg by mouth every Monday, Wednesday, and Friday.     Cholecalciferol (VITAMIN D3) 125 MCG (5000 UT) TABS Take 5,000 Units by mouth daily.     cyclobenzaprine  (FLEXERIL ) 5 MG tablet 1 tablet Orally Three times a day as needed for spasm     dicyclomine  (BENTYL ) 20 MG tablet Take 1 tablet (20 mg total) by mouth 2 (two)  times daily. 20 tablet 0   escitalopram (LEXAPRO) 10 MG tablet Take 10 mg by mouth daily.     furosemide (LASIX) 20 MG tablet Take 20 mg by mouth daily as needed for fluid or edema.     hydrochlorothiazide  (HYDRODIURIL ) 25 MG tablet Take 25 mg by mouth daily.     levothyroxine  (SYNTHROID ) 50 MCG tablet Take 50 mcg by mouth daily before breakfast.      meloxicam  (MOBIC ) 15 MG tablet 1 tablet Orally Once a day as needed for pain for 30 days  montelukast  (SINGULAIR ) 10 MG tablet Take 10 mg by mouth daily.     pantoprazole  (PROTONIX ) 40 MG tablet Take 1 tablet (40 mg total) by mouth daily. 30 tablet 11   polyethylene glycol (MIRALAX ) 17 g packet Take 17 g by mouth daily. 14 each 0   Polyvinyl Alcohol (LUBRICANT DROPS OP) Place 1 drop into both eyes in the morning, at noon, in the evening, and at bedtime.     Potassium Chloride  ER 20 MEQ TBCR Take 1 tablet by mouth daily as needed.     promethazine  (PHENERGAN ) 25 MG tablet Take 25 mg by mouth 2 (two) times daily as needed for nausea or vomiting.     triamcinolone  cream (KENALOG ) 0.1 % Apply 1 Application topically 2 (two) times daily. 80 g 0   TYLENOL  500 MG tablet Take 500 mg by mouth every 6 (six) hours as needed for mild pain (pain score 1-3) or headache.     zolpidem  (AMBIEN ) 10 MG tablet Take 10 mg by mouth at bedtime.     No current facility-administered medications for this visit.   Allergies  Allergen Reactions   Flagyl [Metronidazole] Hives and Other (See Comments)    Hives and welts   Codeine Nausea And Vomiting   Erythromycin Diarrhea and Other (See Comments)    Irritability, diarrhea, yeast infection.    Lisinopril Hives   Penicillins Swelling and Other (See Comments)    Pt states she had some hand swelling from pcn ~ 50 yrs ago when she was pregnant    Tramadol  Other (See Comments)    Dizziness   Benazepril Hcl Rash   Sulfa Antibiotics Nausea And Vomiting     Review of Systems: All systems reviewed and negative except  where noted in HPI.    No results found.  Physical Exam: BP (!) 140/70 (BP Location: Left Arm, Patient Position: Sitting, Cuff Size: Large)   Pulse 64   Ht 5' 3.5 (1.613 m) Comment: height measured without shoes  Wt 188 lb 2 oz (85.3 kg)   BMI 32.80 kg/m  Constitutional: Pleasant,well-developed, female in no acute distress DRE / Anoscopy - posterior midline anal fissure, internal hemorrhoids noted in all positions, stool in rectal vault, no mass lesions. CMS Madison Favre as standby Psychiatric: Normal mood and affect. Behavior is normal.   ASSESSMENT: 78 y.o. female here for assessment of the following  1. Rectal bleeding   2. Grade II hemorrhoids   3. Anal fissure    Unfortunately the patient has not responded very well at all to 2 rounds of hemorrhoid banding.  Recall she had a colonoscopy done last year, hemorrhoids thought to be the cause of her symptoms and she does have what appear to be inflamed hemorrhoids on exam.  I performed DRE and anoscopy today and she has a posterior midline anal fissure, I suspect that may be the cause of her more recent symptoms.  Recommend topical diltiazem/lidocaine  ointment, pea-sized amount applied PR 3 times daily for a few weeks and see if this helps.  Continue MiraLAX  to keep stools soft and prevent constipation.  If her bleeding persists despite fissure treatment, may consider flex sig to reevaluate distal rectum, response to banding and consider if she wants to pursue further banding or surgical evaluation.  Given lack of response to 2 banding so far, I suspect she may need surgical evaluation but she wants to avoid that if possible.  Hopefully with treatment of fissure this will improve her symptoms.  I asked  her to contact me in a few weeks and let me know how she is doing.   PLAN: - treat anal fissure with topical diltiazem / lidocaine  ointment - pea sized amount applied TID for 4 weeks or until healed - continue miralax   - contact me in a  few weeks if no improvement. Flex sig if persistent symptoms and or surgical evaluation  Marcey Naval, MD [facility]

## 2024-06-02 NOTE — Patient Instructions (Addendum)
 We have sent a prescription for diltiazem/lidocaine  ointment to [facility]. You should apply a pea size amount to your rectum three times daily x 3 weeks or until healed.  [facility]'s information is below: Address: [address] [place], [place] [address]  [phone]  *Please DO NOT go directly from our office to pick up this medication! Give the pharmacy 1 day to process the prescription as this is compounded and takes time to make.  _______________________________________________________________________  Continue Miralax   Contact us  in 2 weeks if not improved.    Thank you for entrusting me with your care and for choosing [facility], Dr. Elspeth Naval  _______________________________________________________  If your blood pressure at your visit was 140/90 or greater, please contact your primary care physician to follow up on this.  _______________________________________________________  If you are age 21 or older, your body mass index should be between 23-30. Your Body mass index is 32.8 kg/m. If this is out of the aforementioned range listed, please consider follow up with your Primary Care Provider.  If you are age 15 or younger, your body mass index should be between 19-25. Your Body mass index is 32.8 kg/m. If this is out of the aformentioned range listed, please consider follow up with your Primary Care Provider.   ________________________________________________________  The  GI providers would like to encourage you to use MYCHART to communicate with providers for non-urgent requests or questions.  Due to long hold times on the telephone, sending your provider a message by [facility] may be a faster and more efficient way to get a response.  Please allow 48 business hours for a response.  Please remember that this is for non-urgent requests.  _______________________________________________________  [facility] is  using a team-based approach to care.  Your team is made up of your doctor and two to three APPS. Our APPS (Nurse Practitioners and Physician Assistants) work with your physician to ensure care continuity for you. They are fully qualified to address your health concerns and develop a treatment plan. They communicate directly with your gastroenterologist to care for you. Seeing the Advanced Practice Practitioners on your physician's team can help you by facilitating care more promptly, often allowing for earlier appointments, access to diagnostic testing, procedures, and other specialty referrals.

## 2024-06-12 DIAGNOSIS — E039 Hypothyroidism, unspecified: Secondary | ICD-10-CM | POA: Diagnosis not present

## 2024-06-12 DIAGNOSIS — E782 Mixed hyperlipidemia: Secondary | ICD-10-CM | POA: Diagnosis not present

## 2024-06-12 DIAGNOSIS — F32A Depression, unspecified: Secondary | ICD-10-CM | POA: Diagnosis not present

## 2024-06-12 DIAGNOSIS — I10 Essential (primary) hypertension: Secondary | ICD-10-CM | POA: Diagnosis not present

## 2024-06-12 DIAGNOSIS — F411 Generalized anxiety disorder: Secondary | ICD-10-CM | POA: Diagnosis not present

## 2024-06-13 DIAGNOSIS — I10 Essential (primary) hypertension: Secondary | ICD-10-CM | POA: Diagnosis not present

## 2024-07-05 ENCOUNTER — Telehealth: Payer: Self-pay | Admitting: Gastroenterology

## 2024-07-05 NOTE — Telephone Encounter (Signed)
 Left message for patient to call back

## 2024-07-05 NOTE — Telephone Encounter (Signed)
 PT is calling to give an update. She stated a new prescription after her banding and it worked for the fissure. She stated that the hemorrhoid is dripping blood daily and it has her concerned. Please advise.

## 2024-07-06 NOTE — Telephone Encounter (Signed)
 Left message for patient to call back

## 2024-07-07 NOTE — Telephone Encounter (Signed)
 Left 3rd message on patient voicemail to call back. Will await return correspondence from patient before continuing to reach out further.

## 2024-07-12 DIAGNOSIS — E782 Mixed hyperlipidemia: Secondary | ICD-10-CM | POA: Diagnosis not present

## 2024-07-12 DIAGNOSIS — F32A Depression, unspecified: Secondary | ICD-10-CM | POA: Diagnosis not present

## 2024-07-12 DIAGNOSIS — I10 Essential (primary) hypertension: Secondary | ICD-10-CM | POA: Diagnosis not present

## 2024-07-12 DIAGNOSIS — E039 Hypothyroidism, unspecified: Secondary | ICD-10-CM | POA: Diagnosis not present

## 2024-07-12 DIAGNOSIS — F411 Generalized anxiety disorder: Secondary | ICD-10-CM | POA: Diagnosis not present

## 2024-07-13 ENCOUNTER — Other Ambulatory Visit: Payer: Self-pay | Admitting: Internal Medicine

## 2024-07-13 DIAGNOSIS — Z0000 Encounter for general adult medical examination without abnormal findings: Secondary | ICD-10-CM

## 2024-07-25 DIAGNOSIS — I10 Essential (primary) hypertension: Secondary | ICD-10-CM | POA: Diagnosis not present

## 2024-07-26 DIAGNOSIS — F411 Generalized anxiety disorder: Secondary | ICD-10-CM | POA: Diagnosis not present

## 2024-07-26 DIAGNOSIS — G4700 Insomnia, unspecified: Secondary | ICD-10-CM | POA: Diagnosis not present

## 2024-08-11 ENCOUNTER — Telehealth: Payer: Self-pay | Admitting: Gastroenterology

## 2024-08-11 DIAGNOSIS — K625 Hemorrhage of anus and rectum: Secondary | ICD-10-CM

## 2024-08-11 NOTE — Telephone Encounter (Signed)
 Left message for patient to call back

## 2024-08-11 NOTE — Telephone Encounter (Signed)
 Inbound call from patient stating she would like to be advised on what to do, she had her last hemorrhoid banding 8/25 and this morning she noticed when she went to use the bathroom she was dripping blood. Requesting a call back Please advise  Thank you

## 2024-08-12 DIAGNOSIS — I10 Essential (primary) hypertension: Secondary | ICD-10-CM | POA: Diagnosis not present

## 2024-08-12 DIAGNOSIS — F411 Generalized anxiety disorder: Secondary | ICD-10-CM | POA: Diagnosis not present

## 2024-08-12 DIAGNOSIS — E039 Hypothyroidism, unspecified: Secondary | ICD-10-CM | POA: Diagnosis not present

## 2024-08-12 DIAGNOSIS — E782 Mixed hyperlipidemia: Secondary | ICD-10-CM | POA: Diagnosis not present

## 2024-08-12 NOTE — Telephone Encounter (Signed)
 Left message for patient to call back

## 2024-08-15 NOTE — Telephone Encounter (Signed)
 3rd message left for patient to cal back. Will await a return call before continuing efforts to speak with patient further about her previous concerns.

## 2024-08-16 NOTE — Telephone Encounter (Signed)
 Thanks Dottie Would check a CBC to make sure stable Hgb. Otherwise yes would do flex sig - can book with me or any partners (I see multiple openings with them) to get done ASAP if she is willing. Thanks.  If bleeding persists with any significance she needs to let us  know in the interim.

## 2024-08-16 NOTE — Telephone Encounter (Signed)
 Patient calls stating that she was treated 05/2024 for anal fissure following some hemorrhoidal bandings. States that her bleeding subsided for a short while. However, several weeks ago, she noticed that she is having blood dripping in her underwear/floor etc. Having bleeding independent of bowel movements. BRB. She denies any rectal pain and denies any straining with bowel movements. Says that bleeding may stop for a short period but soon returns. This morning had bloody pajamas. She denies any shortness of breath, dizziness, syncope related to blood loss. Says she is ready to do whatever needs to be done to fix this.   Dr Leigh- Last office note indicates possibly completed flex sig for further eval if bleeding continued. Please advise.

## 2024-08-16 NOTE — Addendum Note (Signed)
 Addended by: CLAUDENE NAOMIE SAILOR on: 08/16/2024 02:01 PM   Modules accepted: Orders

## 2024-08-16 NOTE — Telephone Encounter (Signed)
 PT returning call. Stated that she woke up to her pajamas bloody. Please advise.

## 2024-08-16 NOTE — Telephone Encounter (Signed)
 I have spoken to patient to advise of Dr Hassan recommendations. She will come for CBC tomorrow morning. Dr Hassan first available procedure slot was for 10/07/24. Offered patient several appointments within the next week. However, she states that she has bible study on Mondays and must also have a Thursday or Friday procedure date since that is when her husband is off work. Patient has scheduled flexible sigmoidoscopy with Dr Norleen Kiang on Thursday, 08/25/24 at 1 pm.

## 2024-08-16 NOTE — Telephone Encounter (Signed)
 Okay very good, thank you

## 2024-08-17 ENCOUNTER — Encounter: Payer: Self-pay | Admitting: Internal Medicine

## 2024-08-17 ENCOUNTER — Ambulatory Visit (AMBULATORY_SURGERY_CENTER)

## 2024-08-17 ENCOUNTER — Ambulatory Visit: Payer: Self-pay | Admitting: Gastroenterology

## 2024-08-17 ENCOUNTER — Other Ambulatory Visit (INDEPENDENT_AMBULATORY_CARE_PROVIDER_SITE_OTHER)

## 2024-08-17 VITALS — Ht 64.0 in | Wt 187.2 lb

## 2024-08-17 DIAGNOSIS — K625 Hemorrhage of anus and rectum: Secondary | ICD-10-CM

## 2024-08-17 DIAGNOSIS — K602 Anal fissure, unspecified: Secondary | ICD-10-CM

## 2024-08-17 LAB — CBC WITH DIFFERENTIAL/PLATELET
Basophils Absolute: 0.1 K/uL (ref 0.0–0.1)
Basophils Relative: 1.4 % (ref 0.0–3.0)
Eosinophils Absolute: 0.3 K/uL (ref 0.0–0.7)
Eosinophils Relative: 3.5 % (ref 0.0–5.0)
HCT: 42 % (ref 36.0–46.0)
Hemoglobin: 14.3 g/dL (ref 12.0–15.0)
Lymphocytes Relative: 26.2 % (ref 12.0–46.0)
Lymphs Abs: 1.9 K/uL (ref 0.7–4.0)
MCHC: 34.1 g/dL (ref 30.0–36.0)
MCV: 91 fl (ref 78.0–100.0)
Monocytes Absolute: 0.7 K/uL (ref 0.1–1.0)
Monocytes Relative: 9.5 % (ref 3.0–12.0)
Neutro Abs: 4.2 K/uL (ref 1.4–7.7)
Neutrophils Relative %: 59.4 % (ref 43.0–77.0)
Platelets: 250 K/uL (ref 150.0–400.0)
RBC: 4.61 Mil/uL (ref 3.87–5.11)
RDW: 13.3 % (ref 11.5–15.5)
WBC: 7.1 K/uL (ref 4.0–10.5)

## 2024-08-17 NOTE — Progress Notes (Signed)

## 2024-08-17 NOTE — Addendum Note (Signed)
 Addended by: GERMAN FRANNE BROCKS on: 08/17/2024 09:12 AM   Modules accepted: Orders

## 2024-08-18 ENCOUNTER — Other Ambulatory Visit: Payer: Self-pay | Admitting: Medical Genetics

## 2024-08-22 ENCOUNTER — Other Ambulatory Visit (HOSPITAL_COMMUNITY): Payer: Self-pay | Admitting: Family Medicine

## 2024-08-22 ENCOUNTER — Ambulatory Visit (HOSPITAL_COMMUNITY)
Admission: RE | Admit: 2024-08-22 | Discharge: 2024-08-22 | Disposition: A | Discharge status: Home / Self Care | Source: Ambulatory Visit | Attending: Surgery | Admitting: Surgery

## 2024-08-22 DIAGNOSIS — R2241 Localized swelling, mass and lump, right lower limb: Secondary | ICD-10-CM | POA: Diagnosis not present

## 2024-08-22 DIAGNOSIS — M79605 Pain in left leg: Secondary | ICD-10-CM | POA: Diagnosis not present

## 2024-08-22 DIAGNOSIS — R053 Chronic cough: Secondary | ICD-10-CM | POA: Diagnosis not present

## 2024-08-22 DIAGNOSIS — M79604 Pain in right leg: Secondary | ICD-10-CM | POA: Diagnosis not present

## 2024-08-22 DIAGNOSIS — M7989 Other specified soft tissue disorders: Secondary | ICD-10-CM | POA: Diagnosis not present

## 2024-08-22 DIAGNOSIS — J45901 Unspecified asthma with (acute) exacerbation: Secondary | ICD-10-CM | POA: Diagnosis not present

## 2024-08-22 DIAGNOSIS — R5383 Other fatigue: Secondary | ICD-10-CM | POA: Diagnosis not present

## 2024-08-24 DIAGNOSIS — I10 Essential (primary) hypertension: Secondary | ICD-10-CM | POA: Diagnosis not present

## 2024-08-25 ENCOUNTER — Encounter: Payer: Self-pay | Admitting: Internal Medicine

## 2024-08-25 ENCOUNTER — Ambulatory Visit: Admitting: Internal Medicine

## 2024-08-25 VITALS — BP 142/61 | HR 60 | Temp 97.5°F | Resp 12 | Ht 63.5 in | Wt 182.7 lb

## 2024-08-25 DIAGNOSIS — I10 Essential (primary) hypertension: Secondary | ICD-10-CM | POA: Diagnosis not present

## 2024-08-25 DIAGNOSIS — F32A Depression, unspecified: Secondary | ICD-10-CM | POA: Diagnosis not present

## 2024-08-25 DIAGNOSIS — Z860100 Personal history of colon polyps, unspecified: Secondary | ICD-10-CM

## 2024-08-25 DIAGNOSIS — K625 Hemorrhage of anus and rectum: Secondary | ICD-10-CM

## 2024-08-25 DIAGNOSIS — K5730 Diverticulosis of large intestine without perforation or abscess without bleeding: Secondary | ICD-10-CM | POA: Diagnosis not present

## 2024-08-25 DIAGNOSIS — K602 Anal fissure, unspecified: Secondary | ICD-10-CM

## 2024-08-25 DIAGNOSIS — K648 Other hemorrhoids: Secondary | ICD-10-CM

## 2024-08-25 DIAGNOSIS — K641 Second degree hemorrhoids: Secondary | ICD-10-CM | POA: Diagnosis not present

## 2024-08-25 DIAGNOSIS — F419 Anxiety disorder, unspecified: Secondary | ICD-10-CM | POA: Diagnosis not present

## 2024-08-25 DIAGNOSIS — E039 Hypothyroidism, unspecified: Secondary | ICD-10-CM | POA: Diagnosis not present

## 2024-08-25 DIAGNOSIS — I493 Ventricular premature depolarization: Secondary | ICD-10-CM | POA: Diagnosis not present

## 2024-08-25 MED ORDER — HYDROCORTISONE ACETATE 25 MG RE SUPP: 25.0000 mg | Freq: Every evening | RECTAL | 2 refills | Status: AC

## 2024-08-25 MED ORDER — SODIUM CHLORIDE 0.9 % IV SOLN: 500.0000 mL | INTRAVENOUS | Status: AC

## 2024-08-25 NOTE — Progress Notes (Signed)
 I have reviewed the patient's medical history in detail and updated the computerized patient record.

## 2024-08-25 NOTE — Op Note (Signed)
 [facility] Patient Name: [name] Procedure Date: 08/25/2024 1:54 PM MRN: [id number] Endoscopist: Norleen SAILOR. Abran , MD, [id number] Age: 78 Referring MD:  Date of Birth: 06/05/1946 Gender: Female Account #: [id number] Procedure:                Flexible Sigmoidoscopy Indications:              Rectal hemorrhage Medicines:                Monitored Anesthesia Care Procedure:                Pre-Anesthesia Assessment:                           - Prior to the procedure, a History and Physical                            was performed, and patient medications and                            allergies were reviewed. The patient's tolerance of                            previous anesthesia was also reviewed. The risks                            and benefits of the procedure and the sedation                            options and risks were discussed with the patient.                            All questions were answered, and informed consent                            was obtained. Prior Anticoagulants: The patient has                            taken no anticoagulant or antiplatelet agents. ASA                            Grade Assessment: II - A patient with mild systemic                            disease. After reviewing the risks and benefits,                            the patient was deemed in satisfactory condition to                            undergo the procedure.                           After obtaining informed consent, the scope was  passed under direct vision. The [id number]-[id number] Slim SN                            [id number] was introduced through the anus and                            advanced to the the sigmoid colon. The flexible                            sigmoidoscopy was accomplished without difficulty.                            The patient tolerated the procedure well. The                            quality of the bowel preparation was  excellent. Scope In: 2:02:48 PM Scope Out: 2:04:58 PM Total Procedure Duration: 0 hours 2 minutes 10 seconds  Findings:                 The perianal and digital rectal examinations were                            normal. No external abnormalities. No mass. Normal                            tone. Upon eversion of the anus violaceous internal                            hemorrhoids are noted. See image.                           Internal hemorrhoids were found during                            retroflexion. 1 dominant hemorrhoid was moderate. 2                            areas of scar from prior band placement noted. See                            image                           Multiple diverticula were found in the sigmoid                            colon. Complications:            No immediate complications. Estimated Blood Loss:     Estimated blood loss: none. Impression:               - Internal hemorrhoids. See above description                           - Diverticulosis in the sigmoid colon.                           -  No specimens collected. Recommendation:           1. Prescribed Anusol  HC (or compounded equivalent)                            medicated suppositories; #30; 2 refills; Place 1                            per rectum at bedtime until follow-up                           2. Initiate Citrucel powder fiber. Please take 2                            tablespoons daily in 14 ounces of water  or juice.                            This will improve the consistency of the bowel up                            to reduce pressure on the hemorrhoidal plexus                           3. Office follow-up with Dr. Leigh in 1 month.                            I believe that 1 additional an office banding                            session to address the dominant hemorrhoid is                            warranted.                           4. If bleeding is refractory to all of the  above,                            then colorectal surgical referral. This will be                            under the direction of her primary                            gastroenterologist, Dr. Leigh. Norleen SAILOR. Abran, MD 08/25/2024 2:17:35 PM This report has been signed electronically.

## 2024-08-25 NOTE — Progress Notes (Signed)
 HISTORY OF PRESENT ILLNESS:  [name] is a 78 y.o. female who I been asked to do flexible sigmoidoscopy on for my partner Dr. Leigh to expedient patient's care.  She been having rectal bleeding.  Per Dr. Leigh In short, she had a full colonoscopy with [facility]  in recent years.  She saw me for rectal bleeding, I thought was hemorrhoidal, did a few bandings however and she did not get better.  I then treated her for anal fissure, and still no better.  Wanted to do the flex sig to clarify what exactly is causing her bleeding.  If it is hemorrhoidal and failed banding she would need colorectal surgery eval.  Colonoscopy 01/14/23 - Dr. Saintclair - good prep. 2 x 4-43mm rectal polyps removed. 2 ascending polyps 5-63mm in size, 3mm transverse polyp. Diverticulosis. Internal hemorrhoids. Rectal polyps hyperplastic, the transverse / ascending polyps were adenomatous    REVIEW OF SYSTEMS:  All non-GI ROS negative except for  Past Medical History:  Diagnosis Date   Anxiety    Asthma    Colitis    Depression    Diverticulitis    Hyperlipidemia    Hypertension    Hypothyroidism    IBS (irritable bowel syndrome)    Malignant melanoma (HCC)    PVC's (premature ventricular contractions)     Past Surgical History:  Procedure Laterality Date   BLADDER REPAIR     CHOLECYSTECTOMY     ESOPHAGOGASTRODUODENOSCOPY (EGD) WITH PROPOFOL  N/A 07/24/2019   Procedure: ESOPHAGOGASTRODUODENOSCOPY (EGD) WITH PROPOFOL ;  Surgeon: Dianna Specking, MD;  Location: [facility];  Service: Endoscopy;  Laterality: N/A;   FOOT SURGERY Bilateral    HERNIA REPAIR     LEFT HEART CATH AND CORONARY ANGIOGRAPHY N/A 10/23/2023   Procedure: LEFT HEART CATH AND CORONARY ANGIOGRAPHY;  Surgeon: Jordan, Peter M, MD;  Location: [facility] INVASIVE CV LAB;  Service: Cardiovascular;  Laterality: N/A;   MELANOMA EXCISION     uvula sleep apnea surgery 20 yrs ago Bilateral    VAGINAL HYSTERECTOMY      Social History [name]  reports that she has never smoked. She has never used smokeless tobacco. She reports that she does not drink alcohol and does not use drugs.  family history includes Breast cancer in her paternal grandfather; CAD in her father; Cancer in her father; Coronary artery disease in her mother; Diabetes in her father; Heart attack in her father and mother; Hypertension in her brother, father, mother, and sister; Lung cancer in her mother; Throat cancer in her mother.  Allergies  Allergen Reactions   Flagyl [Metronidazole] Hives and Other (See Comments)    Hives and welts   Codeine Nausea And Vomiting   Erythromycin Diarrhea and Other (See Comments)    Irritability, diarrhea, yeast infection.    Lisinopril Hives   Penicillins Swelling and Other (See Comments)    Pt states she had some hand swelling from pcn ~ 50 yrs ago when she was pregnant    Tramadol  Other (See Comments)    Dizziness   Benazepril Hcl Rash   Sulfa Antibiotics Nausea And Vomiting       PHYSICAL EXAMINATION: Vital signs: BP (!) 147/72   Pulse (!) 59   Temp (!) 97.5 F (36.4 C)   Ht 5' 3.5 (1.613 m)   Wt 182 lb 11.2 oz (82.9 kg)   SpO2 97%   BMI 31.86 kg/m  General: Well-developed, well-nourished, no acute distress HEENT: Sclerae are anicteric, conjunctiva pink. Oral mucosa  intact Lungs: Clear Heart: Regular Abdomen: soft, nontender, nondistended, no obvious ascites, no peritoneal signs, normal bowel sounds. No organomegaly. Extremities: No edema Psychiatric: alert and oriented x3. Cooperative     ASSESSMENT:  Rectal bleeding   PLAN:  Flexible sigmoidoscopy

## 2024-08-25 NOTE — Patient Instructions (Addendum)
-  Handout on hemorrhoids provided -Use Anusol  HC suppositories every might before bedtime until follow up appointment -Initiate Citrucel powder fiber. Take 2 tablespoons daily in 14 ounces of water  or juice. -Follow up appointment with Dr. Leigh   YOU HAD AN ENDOSCOPIC PROCEDURE TODAY AT THE [facility]:   Refer to the procedure report that was given to you for any specific questions about what was found during the examination.  If the procedure report does not answer your questions, please call your gastroenterologist to clarify.  If you requested that your care partner not be given the details of your procedure findings, then the procedure report has been included in a sealed envelope for you to review at your convenience later.  YOU SHOULD EXPECT: Some feelings of bloating in the abdomen. Passage of more gas than usual.  Walking can help get rid of the air that was put into your GI tract during the procedure and reduce the bloating. If you had a lower endoscopy (such as a colonoscopy or flexible sigmoidoscopy) you may notice spotting of blood in your stool or on the toilet paper. If you underwent a bowel prep for your procedure, you may not have a normal bowel movement for a few days.  Please Note:  You might notice some irritation and congestion in your nose or some drainage.  This is from the oxygen used during your procedure.  There is no need for concern and it should clear up in a day or so.  SYMPTOMS TO REPORT IMMEDIATELY:  Following lower endoscopy (colonoscopy or flexible sigmoidoscopy):  Excessive amounts of blood in the stool  Significant tenderness or worsening of abdominal pains  Swelling of the abdomen that is new, acute  Fever of 100F or higher  For urgent or emergent issues, a gastroenterologist can be reached at any hour by calling [phone]. Do not use MyChart messaging for urgent concerns.    DIET:  We do recommend a small meal at first, but then you  may proceed to your regular diet.  Drink plenty of fluids but you should avoid alcoholic beverages for 24 hours.  ACTIVITY:  You should plan to take it easy for the rest of today and you should NOT DRIVE or use heavy machinery until tomorrow (because of the sedation medicines used during the test).    FOLLOW UP: Our staff will call the number listed on your records the next business day following your procedure.  We will call around 7:15- 8:00 am to check on you and address any questions or concerns that you may have regarding the information given to you following your procedure. If we do not reach you, we will leave a message.     If any biopsies were taken you will be contacted by phone or by letter within the next 1-3 weeks.  Please call us  at [phone] if you have not heard about the biopsies in 3 weeks.    SIGNATURES/CONFIDENTIALITY: You and/or your care partner have signed paperwork which will be entered into your electronic medical record.  These signatures attest to the fact that that the information above on your After Visit Summary has been reviewed and is understood.  Full responsibility of the confidentiality of this discharge information lies with you and/or your care-partner.

## 2024-08-25 NOTE — Progress Notes (Signed)
 A/o x 3, VSS, good SR's, pleased with anesthesia, report to RN

## 2024-08-26 ENCOUNTER — Telehealth: Payer: Self-pay | Admitting: *Deleted

## 2024-08-26 NOTE — Telephone Encounter (Signed)
 Attempted f/u phone call. No answer. Left message.

## 2024-08-29 ENCOUNTER — Ambulatory Visit
Admission: RE | Admit: 2024-08-29 | Discharge: 2024-08-29 | Disposition: A | Source: Ambulatory Visit | Attending: Internal Medicine

## 2024-08-29 DIAGNOSIS — Z1231 Encounter for screening mammogram for malignant neoplasm of breast: Secondary | ICD-10-CM | POA: Diagnosis not present

## 2024-08-29 DIAGNOSIS — Z0000 Encounter for general adult medical examination without abnormal findings: Secondary | ICD-10-CM

## 2024-09-11 DIAGNOSIS — E039 Hypothyroidism, unspecified: Secondary | ICD-10-CM | POA: Diagnosis not present

## 2024-09-11 DIAGNOSIS — F32A Depression, unspecified: Secondary | ICD-10-CM | POA: Diagnosis not present

## 2024-09-11 DIAGNOSIS — E782 Mixed hyperlipidemia: Secondary | ICD-10-CM | POA: Diagnosis not present

## 2024-09-11 DIAGNOSIS — I10 Essential (primary) hypertension: Secondary | ICD-10-CM | POA: Diagnosis not present

## 2024-09-11 DIAGNOSIS — F411 Generalized anxiety disorder: Secondary | ICD-10-CM | POA: Diagnosis not present

## 2024-10-25 ENCOUNTER — Encounter: Payer: Self-pay | Admitting: Gastroenterology

## 2024-10-25 ENCOUNTER — Ambulatory Visit: Admitting: Gastroenterology

## 2024-10-25 VITALS — BP 154/88 | HR 69 | Ht 64.0 in | Wt 191.0 lb

## 2024-10-25 DIAGNOSIS — K5909 Other constipation: Secondary | ICD-10-CM

## 2024-10-25 DIAGNOSIS — K625 Hemorrhage of anus and rectum: Secondary | ICD-10-CM

## 2024-10-25 DIAGNOSIS — K641 Second degree hemorrhoids: Secondary | ICD-10-CM | POA: Diagnosis not present

## 2024-10-25 NOTE — Patient Instructions (Signed)
 HEMORRHOID BANDING PROCEDURE    FOLLOW-UP CARE   The procedure you have had should have been relatively painless since the banding of the area involved does not have nerve endings and there is no pain sensation.  The rubber band cuts off the blood supply to the hemorrhoid and the band may fall off as soon as 48 hours after the banding (the band may occasionally be seen in the toilet bowl following a bowel movement). You may notice a temporary feeling of fullness in the rectum which should respond adequately to plain Tylenol  or Motrin .  Following the banding, avoid strenuous exercise that evening and resume full activity the next day.  A sitz bath (soaking in a warm tub) or bidet is soothing, and can be useful for cleansing the area after bowel movements.     To avoid constipation, take two tablespoons of natural wheat bran, natural oat bran, flax, Benefiber or any over the counter fiber supplement and increase your water  intake to 7-8 glasses daily.    Unless you have been prescribed anorectal medication, do not put anything inside your rectum for two weeks: No suppositories, enemas, fingers, etc.  Occasionally, you may have more bleeding than usual after the banding procedure.  This is often from the untreated hemorrhoids rather than the treated one.  Dont be concerned if there is a tablespoon or so of blood.  If there is more blood than this, lie flat with your bottom higher than your head and apply an ice pack to the area. If the bleeding does not stop within a half an hour or if you feel faint, call our office at [phone] or go to the emergency room.  Problems are not common; however, if there is a substantial amount of bleeding, severe pain, chills, fever or difficulty passing urine (very rare) or other problems, you should call us  at [phone] or report to the nearest emergency room.  Do not stay seated continuously for more than 2-3 hours for a day or two after the procedure.   Tighten your buttock muscles 10-15 times every two hours and take 10-15 deep breaths every 1-2 hours.  Do not spend more than a few minutes on the toilet if you cannot empty your bowel; instead re-visit the toilet at a later time.    Thank you for entrusting me with your care and for choosing [facility], Dr. Elspeth Naval    _______________________________________________________  If your blood pressure at your visit was 140/90 or greater, please contact your primary care physician to follow up on this.  _______________________________________________________  If you are age 79 or older, your body mass index should be between 23-30. Your Body mass index is 32.79 kg/m. If this is out of the aforementioned range listed, please consider follow up with your Primary Care Provider.  If you are age 14 or younger, your body mass index should be between 19-25. Your Body mass index is 32.79 kg/m. If this is out of the aformentioned range listed, please consider follow up with your Primary Care Provider.   ________________________________________________________  The [facility] providers would like to encourage you to use MYCHART to communicate with providers for non-urgent requests or questions.  Due to long hold times on the telephone, sending your provider a message by [facility] may be a faster and more efficient way to get a response.  Please allow 48 business hours for a response.  Please remember that this is for non-urgent requests.  _______________________________________________________  [facility] is using a team-based approach to care.  Your team is made up of your doctor and two to three APPS. Our APPS (Nurse Practitioners and Physician Assistants) work with your physician to ensure care continuity for you. They are fully qualified to address your health concerns and develop a treatment plan. They communicate directly with your gastroenterologist to care for you.  Seeing the Advanced Practice Practitioners on your physician's team can help you by facilitating care more promptly, often allowing for earlier appointments, access to diagnostic testing, procedures, and other specialty referrals.

## 2024-10-25 NOTE — Progress Notes (Signed)
 "  HPI :  79 year old female here for a follow-up visit to discuss rectal bleeding.  I have seen her for a variety of issues in recent years.  Recall her colonoscopy is up-to-date from 2024, thought to have hemorrhoidal related bleeding.  I performed banding of the left lateral hemorrhoid in March of last year.  She states this did not help too much and wanted to have another banding and the right posterior hemorrhoid was banded in March of last year.  She continued to have some intermittent bleeding, I saw her in the office in August of last year and thought she had an anal fissure for which treatment was provided.  She had some improvement but continued to have some intermittent bleeding.  She states she feels like a hemorrhoid bursts sporadically and sometimes she can have significant bleeding/blood in her stool.  She denies any constipation or straining currently.  Previously she did and was given some MiraLAX  which she takes occasionally and that can help soften stool however sometimes can make stools too loose.  Her stools can be hard if she does not use something for it.  She previously tried Citrucel which did not help too much, she previously tried a combination MiraLAX  and Citrucel which did not help too much.  I I recommended a flex sig be done this past November for recurrent bleeding to clarify if this is coming from hemorrhoids, fissure or something else.  Dr. Abran graciously assisted with this procedure in my absence, she was found to have inflamed internal hemorrhoids as the likely cause for her bleeding.  She is here to follow-up today to discuss options in management of this.  She has tried some Anusol  since that visit which she thinks may have helped a little bit.  In general she continues to have bleeding however and is looking for more definitive intervention.  She would like to avoid surgery for this if possible.  She is interested in pursuing repeat hemorrhoid banding after discussion  of risks and benefits of it.    Prior workup: Colonoscopy 01/2023 - Dr. Saintclair - repeat in 3 years - no formal report on file    EGD 07/24/2019 - Dr. Dianna - diminutive prepyloric ulcer - no path on file     CT abdomen / pelvis 03/23/2023 - IMPRESSION: 1. Moderate length segment of small bowel wall thickening with intraluminal fluid and mesenteric edema in the pelvis, consistent with enteritis, likely infectious or inflammatory. Given patient previously had colitis, the possibility of inflammatory bowel disease such as Crohn's disease is raised. 2. No hydronephrosis or obstructive uropathy. 3. Hepatic steatosis. 4. Small hiatal hernia. Mild distal colonic diverticulosis without diverticulitis.   Aortic Atherosclerosis (ICD10-I70.0).     CT abdomen / pelvis 10/28/22: IMPRESSION: 1. Mild decompression of the descending colon with minimal submucosal edema as findings may be due to a mild acute colitis of infectious or inflammatory nature. 2. Mild diverticulosis of the sigmoid colon without active inflammation. 3. Mild hepatic steatosis without focal mass. 4. Small sliding hiatal hernia with surgical clips adjacent the hiatal hernia/gastroesophageal junction. 5. Aortic atherosclerosis.   Aortic Atherosclerosis (ICD10-I70.0).     CTA 10/28/22: IMPRESSION: VASCULAR   1. No evidence of active gastrointestinal bleeding. 2.  Aortic Atherosclerosis (ICD10-I70.0).   NON-VASCULAR   1. Mural thickening and pericolonic fat stranding involving the descending and proximal sigmoid colon, consistent with inflammatory or infectious colitis. 2. Rectosigmoid diverticulosis without diverticulitis. 3. Hepatic steatosis. 4. Small hiatal hernia.  Suspected to have ischemic colitis at that time     CT enterography 07/25/2019: IMPRESSION: 1. Acute diverticulitis of the small bowel involving a right pelvic loop of ileum. Previously noted tiny foci of free air in the adjacent  mesentery on recent 07/22/2019 CT study have resolved. No discrete abscess. Inflammatory changes otherwise not substantially changed in the interval. 2. Numerous tiny diverticula throughout the mid to distal ileum. Mild sigmoid diverticulosis. No colonic diverticulitis. 3. Small hiatal hernia 4.  Aortic Atherosclerosis (ICD10-I70.0).    Colonoscopy 01/14/23 - Dr. Saintclair - good prep. 2 x 4-11mm rectal polyps removed. 2 ascending polyps 5-66mm in size, 3mm transverse polyp. Diverticulosis. Internal hemorrhoids. Rectal polyps hyperplastic, the transverse / ascending polyps were adenomatous  Abdominal pain in the left upper quadrant - possibly musculoskeletal but EGD to clear upper tract, Dysphagia - feels it in lower chest, worst with breads EGD 01/04/24: - Esophagogastric landmarks were identified: the Z-line was found at 37 cm, the gastroesophageal junction was found at 37 cm and the upper extent of the gastric folds was found at 37 cm from the incisors. Findings: - One suspected very subtle benign-appearing, intrinsic mild stenosis was found 37 cm from the incisors. A TTS dilator was passed through the scope. Dilation with an 18-19-20 mm balloon dilator was performed to 18 mm, 19 mm and 20 mm. No dilation effect noted there despite dilation to 20mm - The examined esophagus was rather tortuous. 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 - The exam of the esophagus was otherwise normal. - Biopsies were obtained from the proximal and distal esophagus with cold forceps for histology to rule out eosinophilic esophagitis. - Multiple small sessile polyps were found in the gastric fundus and in the gastric body. Suspect benign fundic gland polyps. Representative biopsies were taken with a cold forceps for histology, rule out adenoma. - A small paraesophageal hiatal hernia was present on retroflexed views. - The exam of the stomach was otherwise normal. - Biopsies were taken with  a cold forceps for Helicobacter pylori testing given the patient's abdominal pain. - The examined duodenum was normal.   FINAL DIAGNOSIS       1. Surgical [P], gastric polyps :      - FUNDIC GLAND POLYPS       2. Surgical [P], gastric antrum and gastric body :      - REACTIVE GASTROPATHY      - NEGATIVE FOR H. PYLORI ON H&E STAIN      - NEGATIVE FOR INTESTINAL METAPLASIA OR MALIGNANCY       3. Surgical [P], esophagus :      - ACUTE NONSPECIFIC ESOPHAGITIS.SEE NOTE.      - NEGATIVE FOR INCREASED INTRAEPITHELIAL EOSINOPHILS      - NEGATIVE FOR DYSPLASIA OR MALIGNANCY       Diagnosis Note : - No viral cytopathic effect is identified.Special stain PAS      for fungal organisms is negative.Control is adequate.    Barium swallow 04/12/24: IMPRESSION: 1. A swallowed 13 mm barium tablet was delayed at the level of the distal esophagus/GE junction. The tablet ultimately passed into the stomach after 2.5 to 3 minutes with the patient taking additional swallows of water  and liquid barium contrast. A stricture at this site cannot be excluded. Consider endoscopy for further evaluation. 2. 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 3. Small to moderate-sized size hiatal hernia.   LL banded  3/19 RP banded 6/10 Anal fissure dx 8/21  Flex sig 11/13: Dr. Abran - The perianal and digital rectal examinations were normal. No external abnormalities. No mass. Normal tone. Upon eversion of the anus violaceous internal hemorrhoids are noted. See image. Findings: - Internal hemorrhoids were found during retroflexion. 1 dominant hemorrhoid was moderate. 2 areas of scar from prior band placement noted. See image - Multiple diverticula were found in the sigmoid colon.    Past Medical History:  Diagnosis Date   Anxiety    Asthma    Colitis    Depression    Diverticulitis    Hyperlipidemia    Hypertension    Hypothyroidism     IBS (irritable bowel syndrome)    Malignant melanoma (HCC)    PVC's (premature ventricular contractions)      Past Surgical History:  Procedure Laterality Date   BLADDER REPAIR     CHOLECYSTECTOMY     ESOPHAGOGASTRODUODENOSCOPY (EGD) WITH PROPOFOL  N/A 07/24/2019   Procedure: ESOPHAGOGASTRODUODENOSCOPY (EGD) WITH PROPOFOL ;  Surgeon: Dianna Specking, MD;  Location: [facility];  Service: Endoscopy;  Laterality: N/A;   FOOT SURGERY Bilateral    HERNIA REPAIR     LEFT HEART CATH AND CORONARY ANGIOGRAPHY N/A 10/23/2023   Procedure: LEFT HEART CATH AND CORONARY ANGIOGRAPHY;  Surgeon: Jordan, Peter M, MD;  Location: [facility] IN[facility] CV LAB;  Service: Cardiovascular;  Laterality: N/A;   MELANOMA EXCISION     uvula sleep apnea surgery 20 yrs ago Bilateral    VAGINAL HYSTERECTOMY     Family History  Problem Relation Age of Onset   Heart attack Mother        stroke and cancer   Hypertension Mother    Throat cancer Mother    Coronary artery disease Mother    Lung cancer Mother    Cancer Father    Diabetes Father    Hypertension Father    CAD Father    Heart attack Father        heart attack and died at 15   Hypertension Sister    Hypertension Brother    Breast cancer Paternal Grandfather    Colon cancer Neg Hx    Colon polyps Neg Hx    Esophageal cancer Neg Hx    Stomach cancer Neg Hx    Rectal cancer Neg Hx    Social History[1] Current Outpatient Medications  Medication Sig Dispense Refill   AIRSUPRA 90-80 MCG/ACT AERO SMARTSIG:2 Puff(s) Via Inhaler 6 Times Daily PRN     albuterol  (VENTOLIN  HFA) 108 (90 Base) MCG/ACT inhaler Inhale 2 puffs into the lungs every 6 (six) hours as needed (Asthma).     ALPRAZolam  (XANAX ) 1 MG tablet Take 1 mg by mouth 3 (three) times a week.     AMBULATORY NON FORMULARY MEDICATION Diltiazem gel with 2% lidocaine   Apply a pea sized amount into your rectum three times daily for 3 weeks or until healed Dispense 30 GM zero refill 30 g 0   amLODipine   (NORVASC ) 5 MG tablet Take 5 mg by mouth in the morning.     atorvastatin  (LIPITOR) 40 MG tablet Take 40 mg by mouth every Monday, Wednesday, and Friday.     Cholecalciferol (VITAMIN D3) 125 MCG (5000 UT) TABS Take 5,000 Units by mouth daily.     cyclobenzaprine  (FLEXERIL ) 5 MG tablet 1 tablet Orally Three times a day as needed for spasm     dicyclomine  (BENTYL ) 20 MG tablet Take 1 tablet (20 mg total) by mouth 2 (  two) times daily. 20 tablet 0   escitalopram (LEXAPRO) 10 MG tablet Take 10 mg by mouth daily.     furosemide (LASIX) 20 MG tablet Take 20 mg by mouth daily as needed for fluid or edema.     hydrochlorothiazide  (HYDRODIURIL ) 25 MG tablet Take 25 mg by mouth daily.     hydrocortisone  (ANUSOL -HC) 25 MG suppository Place 1 suppository (25 mg total) rectally at bedtime. 30 suppository 2   levothyroxine  (SYNTHROID ) 50 MCG tablet Take 50 mcg by mouth daily before breakfast.      meloxicam  (MOBIC ) 15 MG tablet 1 tablet Orally Once a day as needed for pain for 30 days     metoprolol  succinate (TOPROL -XL) 50 MG 24 hr tablet Oral     pantoprazole  (PROTONIX ) 40 MG tablet Take 1 tablet (40 mg total) by mouth daily. 30 tablet 11   polyethylene glycol (MIRALAX ) 17 g packet Take 17 g by mouth daily. 14 each 0   Polyvinyl Alcohol (LUBRICANT DROPS OP) Place 1 drop into both eyes in the morning, at noon, in the evening, and at bedtime.     Potassium Chloride  ER 20 MEQ TBCR Take 1 tablet by mouth daily as needed.     promethazine  (PHENERGAN ) 25 MG tablet Take 25 mg by mouth 2 (two) times daily as needed for nausea or vomiting.     TYLENOL  500 MG tablet Take 500 mg by mouth every 6 (six) hours as needed for mild pain (pain score 1-3) or headache.     zolpidem  (AMBIEN ) 10 MG tablet Take 10 mg by mouth at bedtime.     No current facility-administered medications for this visit.   Allergies[2]   Review of Systems: All systems reviewed and negative except where noted in HPI.   Lab Results  Component  Value Date   WBC 7.1 08/17/2024   HGB 14.3 08/17/2024   HCT 42.0 08/17/2024   MCV 91.0 08/17/2024   PLT 250.0 08/17/2024     Physical Exam: BP (!) 152/96   Pulse (!) 59   Ht 5' 4 (1.626 m)   Wt 191 lb (86.6 kg)   SpO2 96%   BMI 32.79 kg/m  Constitutional: Pleasant,well-developed, female in no acute distress. Neurological: Alert and oriented to person place and time. Psychiatric: Normal mood and affect. Behavior is normal.   ASSESSMENT: 79 y.o. female here for assessment of the following  1. Grade II hemorrhoids   2. Rectal bleeding   3. Chronic constipation    Ongoing intermittent rectal bleeding now for several months.  Colonoscopy is up-to-date.  I performed 2 banding's in 2025, several months apart.  She did not feel like this provided significant benefit for her.  She presented back in August in the setting of constipation and was noted to have an anal fissure.  This was treated but her symptoms continue to bother her.  Flex sig done per Dr. Abran in November which confirmed hemorrhoids as the likely cause of her hemorrhoids, despite scarring from banding that was noted.  She had 1 hemorrhoid in particular that was inflamed.  Fortunately CBC is stable without anemia.  She has been on Anusol  and treatment for constipation.  She is rather sensitive to MiraLAX .  At regular dosing she can have looser stools and some incontinence.  She alternatively has tried fiber supplement which has not been strong enough to treat her constipation.  I do think she needs to be on something to minimize her constipation and hard stools.  Would recommend she cut her dosing of MiraLAX  in half to half cap daily and titrate that up as needed.  We otherwise had a lengthy discussion about how aggressive she wanted to be with management of this.  2 hemorrhoid banding's have not provided benefit however the RA area has not been banded yet and on anoscopy as below, that appears to be the source of her  symptoms.  I offered her hemorrhoid banding today to see if we can control this nonoperatively.  Her other option would be to see surgery and have hemorrhoidectomy.  We discussed what that entails, her preference is to avoid that if possible.  I outlined the risks of hemorrhoid banding with her to include pain and bleeding.  She has tolerated this well in the past.  Following discussion she want to proceed with hemorrhoid banding today.  PLAN: - discussed options at length as outlined - RA hemorrhoid banded today, she tolerated it well - continue Miralax  but cut dose to 1/2 cap / daily - contact me if symptoms persist - consider hemorrhoidectomy if she continues to fail banding  Marcey Naval, MD [facility]    PROCEDURE NOTE: The patient presents with symptomatic grade II  hemorrhoids, requesting rubber band ligation of his/her hemorrhoidal disease.  All risks, benefits and alternative forms of therapy were described and informed consent was obtained.  In the Left Lateral Decubitus position anoscopic examination revealed grade II hemorrhoids in the all position(s), worst in RA area. CMA Madison Favre as standby The anorectum was pre-medicated with recticare The decision was made to band the RA internal hemorrhoid, and the [facility] ORegan System was used to perform band ligation without complication.  Digital anorectal examination was then performed to assure proper positioning of the band, and to adjust the banded tissue as required.  The patient was discharged home without pain or other issues.  Dietary and behavioral recommendations were given and along with follow-up instructions.     The following adjunctive treatments were recommended: Reduce Miralax  to 1/2 cap dosing, titrate as neeed  The patient will return as needed  follow-up and possible additional banding as required. No complications were encountered and the patient tolerated the procedure well.    Marcey Naval, MD [facility]     [1]  Social History Tobacco Use   Smoking status: Never   Smokeless tobacco: Never  Vaping Use   Vaping status: Never Used  Substance Use Topics   Alcohol use: No   Drug use: No  [2]  Allergies Allergen Reactions   Flagyl [Metronidazole] Hives and Other (See Comments)    Hives and welts   Codeine Nausea And Vomiting   Erythromycin Diarrhea and Other (See Comments)    Irritability, diarrhea, yeast infection.    Lisinopril Hives   Penicillins Swelling and Other (See Comments)    Pt states she had some hand swelling from pcn ~ 50 yrs ago when she was pregnant    Tramadol  Other (See Comments)    Dizziness   Benazepril Hcl Rash   Sulfa Antibiotics Nausea And Vomiting   "

## 2024-11-15 ENCOUNTER — Other Ambulatory Visit: Payer: Self-pay | Admitting: Medical Genetics

## 2024-11-15 DIAGNOSIS — Z006 Encounter for examination for normal comparison and control in clinical research program: Secondary | ICD-10-CM
